# Patient Record
Sex: Female | Born: 1985
Health system: Southern US, Community
[De-identification: ages and names within clinical notes are randomized; demographics above are authoritative.]

## PROBLEM LIST (undated history)

## (undated) ENCOUNTER — Inpatient Hospital Stay (HOSPITAL_COMMUNITY): Payer: Self-pay

## (undated) DIAGNOSIS — Z8759 Personal history of other complications of pregnancy, childbirth and the puerperium: Secondary | ICD-10-CM

## (undated) DIAGNOSIS — D249 Benign neoplasm of unspecified breast: Secondary | ICD-10-CM

## (undated) DIAGNOSIS — Z8489 Family history of other specified conditions: Secondary | ICD-10-CM

## (undated) DIAGNOSIS — Z803 Family history of malignant neoplasm of breast: Secondary | ICD-10-CM

## (undated) DIAGNOSIS — J45909 Unspecified asthma, uncomplicated: Secondary | ICD-10-CM

## (undated) DIAGNOSIS — G43009 Migraine without aura, not intractable, without status migrainosus: Secondary | ICD-10-CM

## (undated) HISTORY — DX: Family history of malignant neoplasm of breast: Z80.3

## (undated) HISTORY — DX: Migraine without aura, not intractable, without status migrainosus: G43.009

## (undated) HISTORY — PX: SALPINGECTOMY: SHX328

## (undated) HISTORY — DX: Personal history of other complications of pregnancy, childbirth and the puerperium: Z87.59

## (undated) HISTORY — DX: Benign neoplasm of unspecified breast: D24.9

## (undated) SURGERY — Surgical Case
Anesthesia: *Unknown

---
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
## 2004-01-07 ENCOUNTER — Emergency Department (HOSPITAL_COMMUNITY): Admission: EM | Admit: 2004-01-07 | Discharge: 2004-01-08 | Payer: Self-pay

## 2004-05-06 ENCOUNTER — Emergency Department (HOSPITAL_COMMUNITY): Admission: EM | Admit: 2004-05-06 | Discharge: 2004-05-06 | Payer: Self-pay | Admitting: Emergency Medicine

## 2004-06-03 ENCOUNTER — Inpatient Hospital Stay (HOSPITAL_COMMUNITY): Admission: AD | Admit: 2004-06-03 | Discharge: 2004-06-03 | Payer: Self-pay | Admitting: Obstetrics

## 2004-06-29 ENCOUNTER — Other Ambulatory Visit: Admission: RE | Admit: 2004-06-29 | Discharge: 2004-06-29 | Payer: Self-pay | Admitting: Obstetrics and Gynecology

## 2004-08-04 ENCOUNTER — Emergency Department (HOSPITAL_COMMUNITY): Admission: EM | Admit: 2004-08-04 | Discharge: 2004-08-04 | Payer: Self-pay | Admitting: Family Medicine

## 2005-03-23 ENCOUNTER — Inpatient Hospital Stay (HOSPITAL_COMMUNITY): Admission: AD | Admit: 2005-03-23 | Discharge: 2005-03-23 | Payer: Self-pay | Admitting: *Deleted

## 2005-06-01 ENCOUNTER — Ambulatory Visit (HOSPITAL_COMMUNITY): Admission: RE | Admit: 2005-06-01 | Discharge: 2005-06-01 | Payer: Self-pay | Admitting: *Deleted

## 2005-06-24 ENCOUNTER — Ambulatory Visit: Payer: Self-pay | Admitting: Family Medicine

## 2005-06-24 ENCOUNTER — Inpatient Hospital Stay (HOSPITAL_COMMUNITY): Admission: AD | Admit: 2005-06-24 | Discharge: 2005-06-24 | Payer: Self-pay | Admitting: Obstetrics and Gynecology

## 2005-09-24 ENCOUNTER — Inpatient Hospital Stay (HOSPITAL_COMMUNITY): Admission: AD | Admit: 2005-09-24 | Discharge: 2005-09-24 | Payer: Self-pay | Admitting: *Deleted

## 2005-10-04 ENCOUNTER — Emergency Department (HOSPITAL_COMMUNITY): Admission: EM | Admit: 2005-10-04 | Discharge: 2005-10-04 | Payer: Self-pay | Admitting: *Deleted

## 2005-10-06 ENCOUNTER — Ambulatory Visit: Payer: Self-pay | Admitting: *Deleted

## 2005-10-06 ENCOUNTER — Inpatient Hospital Stay (HOSPITAL_COMMUNITY): Admission: AD | Admit: 2005-10-06 | Discharge: 2005-10-06 | Payer: Self-pay | Admitting: Obstetrics and Gynecology

## 2005-10-17 ENCOUNTER — Ambulatory Visit: Payer: Self-pay | Admitting: Family Medicine

## 2005-10-17 ENCOUNTER — Inpatient Hospital Stay (HOSPITAL_COMMUNITY): Admission: AD | Admit: 2005-10-17 | Discharge: 2005-10-20 | Payer: Self-pay | Admitting: *Deleted

## 2007-06-30 ENCOUNTER — Emergency Department (HOSPITAL_COMMUNITY): Admission: EM | Admit: 2007-06-30 | Discharge: 2007-06-30 | Payer: Self-pay | Admitting: Family Medicine

## 2007-08-22 ENCOUNTER — Emergency Department (HOSPITAL_COMMUNITY): Admission: EM | Admit: 2007-08-22 | Discharge: 2007-08-22 | Payer: Self-pay | Admitting: Emergency Medicine

## 2007-12-09 ENCOUNTER — Emergency Department (HOSPITAL_COMMUNITY): Admission: EM | Admit: 2007-12-09 | Discharge: 2007-12-09 | Payer: Self-pay | Admitting: Family Medicine

## 2008-07-17 ENCOUNTER — Emergency Department (HOSPITAL_COMMUNITY): Admission: EM | Admit: 2008-07-17 | Discharge: 2008-07-17 | Payer: Self-pay | Admitting: Family Medicine

## 2008-10-09 ENCOUNTER — Emergency Department (HOSPITAL_COMMUNITY): Admission: EM | Admit: 2008-10-09 | Discharge: 2008-10-09 | Payer: Self-pay | Admitting: Emergency Medicine

## 2008-12-11 ENCOUNTER — Emergency Department (HOSPITAL_COMMUNITY): Admission: EM | Admit: 2008-12-11 | Discharge: 2008-12-11 | Payer: Self-pay | Admitting: Family Medicine

## 2009-03-02 ENCOUNTER — Emergency Department (HOSPITAL_COMMUNITY): Admission: EM | Admit: 2009-03-02 | Discharge: 2009-03-02 | Payer: Self-pay | Admitting: Family Medicine

## 2011-01-19 LAB — POCT RAPID STREP A (OFFICE): Streptococcus, Group A Screen (Direct): NEGATIVE

## 2011-05-27 ENCOUNTER — Inpatient Hospital Stay (INDEPENDENT_AMBULATORY_CARE_PROVIDER_SITE_OTHER)
Admission: RE | Admit: 2011-05-27 | Discharge: 2011-05-27 | Disposition: A | Discharge status: Home / Self Care | Payer: Self-pay | Source: Ambulatory Visit | Attending: Emergency Medicine | Admitting: Emergency Medicine

## 2011-05-27 DIAGNOSIS — N926 Irregular menstruation, unspecified: Secondary | ICD-10-CM

## 2011-05-27 LAB — POCT I-STAT, CHEM 8
BUN: 14 mg/dL (ref 6–23)
Calcium, Ion: 1.18 mmol/L (ref 1.12–1.32)
HCT: 44 % (ref 36.0–46.0)
Hemoglobin: 15 g/dL (ref 12.0–15.0)
Sodium: 139 mEq/L (ref 135–145)
TCO2: 26 mmol/L (ref 0–100)

## 2011-05-27 LAB — POCT PREGNANCY, URINE: Preg Test, Ur: NEGATIVE

## 2011-07-05 ENCOUNTER — Inpatient Hospital Stay (INDEPENDENT_AMBULATORY_CARE_PROVIDER_SITE_OTHER)
Admission: RE | Admit: 2011-07-05 | Discharge: 2011-07-05 | Disposition: A | Discharge status: Home / Self Care | Payer: Self-pay | Source: Ambulatory Visit | Attending: Family Medicine | Admitting: Family Medicine

## 2011-07-05 DIAGNOSIS — J019 Acute sinusitis, unspecified: Secondary | ICD-10-CM

## 2011-07-08 ENCOUNTER — Ambulatory Visit (INDEPENDENT_AMBULATORY_CARE_PROVIDER_SITE_OTHER): Payer: Self-pay

## 2011-07-08 ENCOUNTER — Inpatient Hospital Stay (INDEPENDENT_AMBULATORY_CARE_PROVIDER_SITE_OTHER)
Admission: RE | Admit: 2011-07-08 | Discharge: 2011-07-08 | Disposition: A | Discharge status: Home / Self Care | Payer: Self-pay | Source: Ambulatory Visit | Attending: Family Medicine | Admitting: Family Medicine

## 2011-07-08 DIAGNOSIS — J189 Pneumonia, unspecified organism: Secondary | ICD-10-CM

## 2011-07-08 DIAGNOSIS — J019 Acute sinusitis, unspecified: Secondary | ICD-10-CM

## 2011-07-10 ENCOUNTER — Inpatient Hospital Stay (INDEPENDENT_AMBULATORY_CARE_PROVIDER_SITE_OTHER)
Admission: RE | Admit: 2011-07-10 | Discharge: 2011-07-10 | Disposition: A | Discharge status: Home / Self Care | Payer: Self-pay | Source: Ambulatory Visit | Attending: Family Medicine | Admitting: Family Medicine

## 2011-07-10 DIAGNOSIS — J189 Pneumonia, unspecified organism: Secondary | ICD-10-CM

## 2011-07-12 LAB — POCT RAPID STREP A: Streptococcus, Group A Screen (Direct): NEGATIVE

## 2011-07-20 LAB — POCT RAPID STREP A: Streptococcus, Group A Screen (Direct): POSITIVE — AB

## 2011-07-22 LAB — I-STAT 8, (EC8 V) (CONVERTED LAB)
BUN: 12
Bicarbonate: 27.8 — ABNORMAL HIGH
Chloride: 103
Glucose, Bld: 96
pCO2, Ven: 56.6 — ABNORMAL HIGH
pH, Ven: 7.299

## 2011-07-22 LAB — POCT URINALYSIS DIP (DEVICE)
Bilirubin Urine: NEGATIVE
Glucose, UA: NEGATIVE
Hgb urine dipstick: NEGATIVE
Ketones, ur: NEGATIVE
Nitrite: NEGATIVE
pH: 5

## 2011-07-22 LAB — POCT I-STAT CREATININE: Creatinine, Ser: 0.8

## 2015-07-31 ENCOUNTER — Encounter: Payer: Self-pay | Admitting: Obstetrics and Gynecology

## 2015-07-31 ENCOUNTER — Ambulatory Visit (INDEPENDENT_AMBULATORY_CARE_PROVIDER_SITE_OTHER): Payer: Managed Care, Other (non HMO) | Admitting: Obstetrics and Gynecology

## 2015-07-31 VITALS — BP 134/76 | HR 80 | Resp 16 | Ht 66.5 in | Wt 183.0 lb

## 2015-07-31 DIAGNOSIS — Z23 Encounter for immunization: Secondary | ICD-10-CM | POA: Diagnosis not present

## 2015-07-31 DIAGNOSIS — Z01419 Encounter for gynecological examination (general) (routine) without abnormal findings: Secondary | ICD-10-CM

## 2015-07-31 DIAGNOSIS — Z30432 Encounter for removal of intrauterine contraceptive device: Secondary | ICD-10-CM | POA: Diagnosis not present

## 2015-07-31 DIAGNOSIS — Z3169 Encounter for other general counseling and advice on procreation: Secondary | ICD-10-CM

## 2015-07-31 LAB — COMPREHENSIVE METABOLIC PANEL
ALK PHOS: 50 U/L (ref 33–115)
ALT: 7 U/L (ref 6–29)
AST: 14 U/L (ref 10–30)
Albumin: 4.2 g/dL (ref 3.6–5.1)
BILIRUBIN TOTAL: 0.5 mg/dL (ref 0.2–1.2)
BUN: 10 mg/dL (ref 7–25)
CO2: 25 mmol/L (ref 20–31)
CREATININE: 0.79 mg/dL (ref 0.50–1.10)
Calcium: 9.2 mg/dL (ref 8.6–10.2)
Chloride: 102 mmol/L (ref 98–110)
Glucose, Bld: 99 mg/dL (ref 65–99)
Potassium: 3.8 mmol/L (ref 3.5–5.3)
SODIUM: 138 mmol/L (ref 135–146)
TOTAL PROTEIN: 6.9 g/dL (ref 6.1–8.1)

## 2015-07-31 LAB — CBC
HCT: 41.5 % (ref 36.0–46.0)
Hemoglobin: 14.1 g/dL (ref 12.0–15.0)
MCH: 29.6 pg (ref 26.0–34.0)
MCHC: 34 g/dL (ref 30.0–36.0)
MCV: 87 fL (ref 78.0–100.0)
MPV: 11.1 fL (ref 8.6–12.4)
Platelets: 309 K/uL (ref 150–400)
RBC: 4.77 MIL/uL (ref 3.87–5.11)
RDW: 13 % (ref 11.5–15.5)
WBC: 8.2 K/uL (ref 4.0–10.5)

## 2015-07-31 LAB — LIPID PANEL
Cholesterol: 153 mg/dL (ref 125–200)
HDL: 37 mg/dL — ABNORMAL LOW (ref >=46)
LDL Cholesterol: 85 mg/dL (ref <130)
Total CHOL/HDL Ratio: 4.1 ratio (ref <=5.0)
Triglycerides: 153 mg/dL — ABNORMAL HIGH (ref <150)
VLDL: 31 mg/dL — ABNORMAL HIGH (ref <30)

## 2015-07-31 NOTE — Patient Instructions (Signed)

## 2015-07-31 NOTE — Progress Notes (Signed)
Patient ID: Kathy Aguilar, female   DOB: 1986-03-28, 29 y.o.   MRN: [id number] 29 y.o. [id number] Single Caucasian female here for annual exam.    Has had Mirena IUDs for the last 9 years.  Can skip cycles.  Would like IUD removed.  Interested in pregnancy.  Works for a Engineer, civil (consulting)security company.  [facility] [employer].  30 year old daughter.  Recently married.   PCP:   None  Patient's last menstrual period was 07/26/2015 (exact date).          Sexually active: Yes.   female The current method of family planning is IUD--Mirena inserted 11/2010.    Exercising: No.   Smoker:  no  Health Maintenance: Pap:  07/2014 normal per patient History of abnormal Pap:  no MMG:  n/a Colonoscopy:  n/a BMD:   na  Result  n/a TDaP:  10 years ago Screening Labs:  Hb today: 13.9, Urine today: Neg   reports that she has never smoked. She does not have any smokeless tobacco history on file. She reports that she drinks about 0.6 oz of alcohol per week. She reports that she does not use illicit drugs.  History reviewed. No pertinent past medical history.  History reviewed. No pertinent past surgical history.  No current outpatient prescriptions on file.   No current facility-administered medications for this visit.    Family History  Problem Relation Age of Onset  . Osteoarthritis Maternal Grandmother   . Hypertension Maternal Grandmother     ROS:  Pertinent items are noted in HPI.  Otherwise, a comprehensive ROS was negative.  Exam:   BP 134/76 mmHg  Pulse 80  Resp 16  Ht 5' 6.5" (1.689 m)  Wt 183 lb (83.008 kg)  BMI 29.10 kg/m2  LMP 07/26/2015 (Exact Date)    General appearance: alert, cooperative and appears stated age Head: Normocephalic, without obvious abnormality, atraumatic Neck: no adenopathy, supple, symmetrical, trachea midline and thyroid normal to inspection and palpation Lungs: clear to auscultation bilaterally Breasts: normal appearance, no masses or tenderness, Inspection negative, No  nipple retraction or dimpling, No nipple discharge or bleeding, No axillary or supraclavicular adenopathy Heart: regular rate and rhythm Abdomen: soft, non-tender; bowel sounds normal; no masses,  no organomegaly Extremities: extremities normal, atraumatic, no cyanosis or edema Skin: Skin color, texture, turgor normal. No rashes or lesions Lymph nodes: Cervical, supraclavicular, and axillary nodes normal. No abnormal inguinal nodes palpated Neurologic: Grossly normal  Pelvic: External genitalia:  no lesions              Urethra:  normal appearing urethra with no masses, tenderness or lesions              Bartholins and Skenes: normal                 Vagina: normal appearing vagina with normal color and discharge, no lesions              Cervix: no lesions and IUD strings seen.  Verbal consent for IUD removal, accomplished with ring forceps.  IUD intact and discarded.              Pap taken: No. Bimanual Exam:  Uterus:  normal size, contour, position, consistency, mobility, non-tender              Adnexa: normal adnexa and no mass, fullness, tenderness              Rectovaginal: No..     Chaperone was present  for exam.  Assessment:   Well woman visit with normal exam. Mirena IUD removed.  Desire for future fertility.   Plan: Yearly mammogram recommended after age 54.  Recommended self breast exam.  Pap and HR HPV as above. Recommended PNV. (Is currently taking.) Discussed regular exercise program including cardiovascular and weight bearing exercise. Labs performed.  Yes.  .   See orders.  Routine labs and Rubella titer. Refills given on medications.  No..    Discussed preconception care - avoid exposures, healthy diet and exercise and influenza vaccine.  Will receive TDap today. Discussed quick return to fertility.  Will need to avoid pregnancy for one month if needs Rubella vaccine.   Follow up annually and prn.     After visit summary provided.

## 2015-08-05 LAB — RUBELLA ANTIBODY, IGM: RUBELLA: 0.5 (ref <0.91)

## 2015-08-06 ENCOUNTER — Telehealth: Payer: Self-pay

## 2015-08-06 NOTE — Telephone Encounter (Signed)
Spoke with patient. Results and message given as seen below from Dr.Silva. Patient was not fasting when she had her labs done. Advised this may be cause for elevation but should still work on diet and exercise to reduce numbers. Patient is agreeable and verbalizes understanding.  Routing to provider for final review. Patient agreeable to disposition. Will close encounter.

## 2015-08-06 NOTE — Telephone Encounter (Signed)
Left message to call Kaitlyn at [phone]. 

## 2015-08-06 NOTE — Telephone Encounter (Signed)
-----   Message from Patton SallesBrook E Amundson C Silva, MD sent at 08/05/2015  7:04 PM EDT ----- Please report to patient her results of blood work showing mildly elevated triglycerides and low HDL (Good cholesterol).  This may not have been a fasting set of labs, but exercise and a diet low in saturated fat and cholesterol can reduce the numbers and risk of cardiovascular disease.  The CBC and metabolic profile were normal.   Her rubella titer is still pending.  It should be ready by the end of the week.  Just did not want her to keep waiting for the rest of these results.  Cc- Claudette LawsAmanda Dixon

## 2015-08-07 ENCOUNTER — Telehealth: Payer: Self-pay

## 2015-08-07 NOTE — Telephone Encounter (Signed)
-----  Message from Nunzio Cobbs, MD sent at 08/06/2015  4:34 PM EDT ----- Please let patient know of her rubella not immune status.  She will need to do an MMR through her PCP or the health department and prevent intercourse for one full month following her injection.   Cc- Marisa Sprinkles

## 2015-08-07 NOTE — Telephone Encounter (Signed)
Spoke with patient. Results given as seen below. Patient is agreeable. Will seek care at a local PCP close to home to have MMR. Aware she will need to prevent intercourse for one full month following her injection. Patient is agreeable.  Routing to provider for final review. Patient agreeable to disposition. Will close encounter.

## 2015-12-31 ENCOUNTER — Encounter: Payer: Self-pay | Admitting: Obstetrics and Gynecology

## 2016-01-14 ENCOUNTER — Emergency Department: Payer: Managed Care, Other (non HMO)

## 2016-01-14 ENCOUNTER — Encounter: Payer: Self-pay | Admitting: Emergency Medicine

## 2016-01-14 ENCOUNTER — Encounter
Admission: EM | Disposition: A | Discharge status: Home / Self Care | Payer: Self-pay | Source: Home / Self Care | Attending: Emergency Medicine

## 2016-01-14 ENCOUNTER — Observation Stay
Admission: EM | Admit: 2016-01-14 | Discharge: 2016-01-15 | Disposition: A | Discharge status: Home / Self Care | Payer: Managed Care, Other (non HMO) | Attending: Surgery | Admitting: Surgery

## 2016-01-14 DIAGNOSIS — Z8261 Family history of arthritis: Secondary | ICD-10-CM | POA: Diagnosis not present

## 2016-01-14 DIAGNOSIS — Z8249 Family history of ischemic heart disease and other diseases of the circulatory system: Secondary | ICD-10-CM | POA: Diagnosis not present

## 2016-01-14 DIAGNOSIS — R110 Nausea: Secondary | ICD-10-CM | POA: Diagnosis not present

## 2016-01-14 DIAGNOSIS — K3580 Unspecified acute appendicitis: Principal | ICD-10-CM | POA: Diagnosis present

## 2016-01-14 DIAGNOSIS — Z809 Family history of malignant neoplasm, unspecified: Secondary | ICD-10-CM | POA: Insufficient documentation

## 2016-01-14 DIAGNOSIS — R1031 Right lower quadrant pain: Secondary | ICD-10-CM | POA: Diagnosis not present

## 2016-01-14 DIAGNOSIS — K353 Acute appendicitis with localized peritonitis, without perforation or gangrene: Secondary | ICD-10-CM | POA: Insufficient documentation

## 2016-01-14 HISTORY — PX: LAPAROSCOPIC APPENDECTOMY: SHX408

## 2016-01-14 LAB — URINALYSIS COMPLETE WITH MICROSCOPIC (ARMC ONLY)
Bilirubin Urine: NEGATIVE
Glucose, UA: NEGATIVE mg/dL
HGB URINE DIPSTICK: NEGATIVE
Ketones, ur: NEGATIVE mg/dL
Leukocytes, UA: NEGATIVE
NITRITE: NEGATIVE
PROTEIN: NEGATIVE mg/dL
SPECIFIC GRAVITY, URINE: 1.004 — AB (ref 1.005–1.030)
pH: 7 (ref 5.0–8.0)

## 2016-01-14 LAB — COMPREHENSIVE METABOLIC PANEL
ALBUMIN: 4.5 g/dL (ref 3.5–5.0)
ALK PHOS: 52 U/L (ref 38–126)
ALT: 11 U/L — ABNORMAL LOW (ref 14–54)
ANION GAP: 5 (ref 5–15)
AST: 18 U/L (ref 15–41)
BUN: 12 mg/dL (ref 6–20)
CALCIUM: 8.7 mg/dL — AB (ref 8.9–10.3)
CO2: 25 mmol/L (ref 22–32)
Chloride: 102 mmol/L (ref 101–111)
Creatinine, Ser: 0.72 mg/dL (ref 0.44–1.00)
GFR calc non Af Amer: >60 mL/min (ref >60)
GLUCOSE: 95 mg/dL (ref 65–99)
POTASSIUM: 3.5 mmol/L (ref 3.5–5.1)
SODIUM: 132 mmol/L — AB (ref 135–145)
Total Bilirubin: 0.4 mg/dL (ref 0.3–1.2)
Total Protein: 7.7 g/dL (ref 6.5–8.1)

## 2016-01-14 LAB — CBC
HEMATOCRIT: 40.1 % (ref 35.0–47.0)
HEMOGLOBIN: 13.7 g/dL (ref 12.0–16.0)
MCH: 29.2 pg (ref 26.0–34.0)
MCHC: 34.1 g/dL (ref 32.0–36.0)
MCV: 85.4 fL (ref 80.0–100.0)
Platelets: 226 10*3/uL (ref 150–440)
RBC: 4.7 MIL/uL (ref 3.80–5.20)
RDW: 13 % (ref 11.5–14.5)
WBC: 15.9 10*3/uL — ABNORMAL HIGH (ref 3.6–11.0)

## 2016-01-14 LAB — LIPASE, BLOOD: LIPASE: 17 U/L (ref 11–51)

## 2016-01-14 LAB — POCT PREGNANCY, URINE: PREG TEST UR: NEGATIVE

## 2016-01-14 SURGERY — APPENDECTOMY, LAPAROSCOPIC
Anesthesia: General | Site: Abdomen | Wound class: Clean Contaminated

## 2016-01-14 MED ORDER — IOPAMIDOL (ISOVUE-300) INJECTION 61%
100.0000 mL | Freq: Once | INTRAVENOUS | Status: AC | PRN
  Administered 2016-01-14: 100 mL via INTRAVENOUS

## 2016-01-14 MED ORDER — MORPHINE SULFATE (PF) 4 MG/ML IV SOLN
4.0000 mg | Freq: Once | INTRAVENOUS | Status: AC
  Administered 2016-01-14: 4 mg via INTRAVENOUS
  Filled 2016-01-14: qty 1

## 2016-01-14 MED ORDER — DIATRIZOATE MEGLUMINE & SODIUM 66-10 % PO SOLN
15.0000 mL | Freq: Once | ORAL | Status: AC
  Administered 2016-01-14: 15 mL via ORAL

## 2016-01-14 MED ORDER — SODIUM CHLORIDE 0.9 % IV BOLUS (SEPSIS)
1000.0000 mL | Freq: Once | INTRAVENOUS | Status: AC
  Administered 2016-01-14: 1000 mL via INTRAVENOUS

## 2016-01-14 MED ORDER — ONDANSETRON HCL 4 MG/2ML IJ SOLN
4.0000 mg | Freq: Once | INTRAMUSCULAR | Status: AC
  Administered 2016-01-14: 4 mg via INTRAVENOUS
  Filled 2016-01-14: qty 2

## 2016-01-14 SURGICAL SUPPLY — 47 items
ADH LQ OCL WTPRF AMP STRL LF (MISCELLANEOUS) ×1
ADHESIVE MASTISOL STRL (MISCELLANEOUS) ×3 IMPLANT
APPLIER CLIP ROT 10 11.4 M/L (STAPLE) ×3
APR CLP MED LRG 11.4X10 (STAPLE) ×1
BLADE SURG SZ11 CARB STEEL (BLADE) ×3 IMPLANT
CANISTER SUCT 3000ML (MISCELLANEOUS) ×3 IMPLANT
CATH TRAY 16F METER LATEX (MISCELLANEOUS) ×3 IMPLANT
CHLORAPREP W/TINT 26ML (MISCELLANEOUS) ×3 IMPLANT
CLIP APPLIE ROT 10 11.4 M/L (STAPLE) ×1 IMPLANT
CLOSURE WOUND 1/2 X4 (GAUZE/BANDAGES/DRESSINGS) ×1
CUTTER FLEX LINEAR 45M (STAPLE) ×3 IMPLANT
DEVICE TROCAR PUNCTURE CLOSURE (ENDOMECHANICALS) ×3 IMPLANT
ELECT REM PT RETURN 9FT ADLT (ELECTROSURGICAL) ×3
ELECTRODE REM PT RTRN 9FT ADLT (ELECTROSURGICAL) ×1 IMPLANT
ENDOPOUCH RETRIEVER 10 (MISCELLANEOUS) ×3 IMPLANT
GAUZE SPONGE NON-WVN 2X2 STRL (MISCELLANEOUS) ×3 IMPLANT
GLOVE BIO SURGEON STRL SZ8 (GLOVE) ×3 IMPLANT
GOWN STRL REUS W/ TWL LRG LVL3 (GOWN DISPOSABLE) ×2 IMPLANT
GOWN STRL REUS W/TWL LRG LVL3 (GOWN DISPOSABLE) ×6
IRRIGATION STRYKERFLOW (MISCELLANEOUS) ×1 IMPLANT
IRRIGATOR STRYKERFLOW (MISCELLANEOUS) ×3
KIT RM TURNOVER STRD PROC AR (KITS) ×3 IMPLANT
LABEL OR SOLS (LABEL) ×3 IMPLANT
NDL SAFETY 22GX1.5 (NEEDLE) ×3 IMPLANT
NEEDLE VERESS 14GA 120MM (NEEDLE) ×3 IMPLANT
NS IRRIG 500ML POUR BTL (IV SOLUTION) ×3 IMPLANT
PACK LAP CHOLECYSTECTOMY (MISCELLANEOUS) ×3 IMPLANT
RELOAD 45 VASCULAR/THIN (ENDOMECHANICALS) ×3 IMPLANT
RELOAD STAPLE 45 2.5 WHT GRN (ENDOMECHANICALS) ×1 IMPLANT
RELOAD STAPLE 45 3.5 BLU ETS (ENDOMECHANICALS) ×1 IMPLANT
RELOAD STAPLE TA45 3.5 REG BLU (ENDOMECHANICALS) ×3 IMPLANT
SCISSORS METZENBAUM CVD 33 (INSTRUMENTS) IMPLANT
SLEEVE ENDOPATH XCEL 5M (ENDOMECHANICALS) ×3 IMPLANT
SOL .9 NS 3000ML IRR  AL (IV SOLUTION) ×2
SOL .9 NS 3000ML IRR AL (IV SOLUTION) ×1
SOL .9 NS 3000ML IRR UROMATIC (IV SOLUTION) ×1 IMPLANT
SPONGE LAP 18X18 5 PK (GAUZE/BANDAGES/DRESSINGS) ×3 IMPLANT
SPONGE VERSALON 2X2 STRL (MISCELLANEOUS) ×9
STRIP CLOSURE SKIN 1/2X4 (GAUZE/BANDAGES/DRESSINGS) ×2 IMPLANT
SUT MNCRL 4-0 (SUTURE) ×3
SUT MNCRL 4-0 27XMFL (SUTURE) ×1
SUT VICRYL 0 TIES 12 18 (SUTURE) ×3 IMPLANT
SUTURE MNCRL 4-0 27XMF (SUTURE) ×1 IMPLANT
TRAP SPECIMEN MUCOUS 40CC (MISCELLANEOUS) ×1 IMPLANT
TROCAR XCEL 12X100 BLDLESS (ENDOMECHANICALS) ×3 IMPLANT
TROCAR XCEL NON-BLD 5MMX100MML (ENDOMECHANICALS) ×3 IMPLANT
TUBING INSUFFLATOR HI FLOW (MISCELLANEOUS) ×3 IMPLANT

## 2016-01-14 NOTE — H&P (Signed)
Kathy Aguilar is an 30 y.o. female.    Chief Complaint: Right lower Quadrant pain  HPI: This patient with the acute onset of right lower quadrant pain this morning that woke her up her proximally 6 AM she's never had an episode like this before she's had nausea but no emesis and 8 at about noon today. She denies fevers or chills is never had an episode like this before and points the right lower quadrant and right flank as the area of maximal pain. She's had a normal bowel movement today.  No significant family history. 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  History reviewed. No pertinent past medical history.  History reviewed. No pertinent past surgical history.  Family History  Problem Relation Age of Onset  . Osteoarthritis Maternal Grandmother   . Hypertension Maternal Grandmother   . Cancer Maternal Grandmother     blood cancer?   Social History:  reports that she has never smoked. She does not have any smokeless tobacco history on file. She reports that she drinks about 0.6 oz of alcohol per week. She reports that she does not use illicit drugs.  Allergies: No Known Allergies   (Not in a hospital admission)   Review of Systems  Constitutional: Negative.   HENT: Negative.   Eyes: Negative.   Respiratory: Negative.   Cardiovascular: Negative.   Gastrointestinal: Positive for nausea and abdominal pain. Negative for heartburn, vomiting, diarrhea, constipation, blood in stool and melena.  Genitourinary: Negative.   Musculoskeletal: Negative.   Skin: Negative.   Neurological: Negative.   Endo/Heme/Allergies: Negative.   Psychiatric/Behavioral: Negative.      Physical Exam:  BP 125/84 mmHg  Pulse 95  Temp(Src) 98.6 F (37 C) (Oral)  Resp 20  Ht '5\' 6"'  (1.676 m)  Wt 183 lb (83.008 kg)  BMI 29.55 kg/m2  SpO2 100%  LMP 12/16/2015 (Exact Date)  Physical Exam  Constitutional: She is oriented to person, place, and time and well-developed,  well-nourished, and in no distress. No distress.  Appears quite comfortable  HENT:  Head: Normocephalic and atraumatic.  Eyes: Pupils are equal, round, and reactive to light. Right eye exhibits no discharge. Left eye exhibits no discharge. No scleral icterus.  Neck: Normal range of motion. Neck supple.  Cardiovascular: Normal rate, regular rhythm and normal heart sounds.   Pulmonary/Chest: Effort normal and breath sounds normal. No respiratory distress. She has no wheezes. She has no rales.  Abdominal: Soft. She exhibits no distension. There is tenderness. There is guarding. There is no rebound.  Axial tenderness right lower quadrant McBurney's point with a questionable Rovsing sign  Musculoskeletal: Normal range of motion. She exhibits no edema.  Lymphadenopathy:    She has no cervical adenopathy.  Neurological: She is alert and oriented to person, place, and time.  Skin: Skin is warm and dry. She is not diaphoretic.  Psychiatric: Mood and affect normal.  Vitals reviewed.       Results for orders placed or performed during the hospital encounter of 01/14/16 (from the past 48 hour(s))  Lipase, blood     Status: None   Collection Time: 01/14/16  3:51 PM  Result Value Ref Range   Lipase 17 11 - 51 U/L  Comprehensive metabolic panel     Status: Abnormal   Collection Time: 01/14/16  3:51 PM  Result Value Ref Range   Sodium 132 (L) 135 - 145 mmol/L   Potassium 3.5 3.5 - 5.1 mmol/L   Chloride  102 101 - 111 mmol/L   CO2 25 22 - 32 mmol/L   Glucose, Bld 95 65 - 99 mg/dL   BUN 12 6 - 20 mg/dL   Creatinine, Ser 0.72 0.44 - 1.00 mg/dL   Calcium 8.7 (L) 8.9 - 10.3 mg/dL   Total Protein 7.7 6.5 - 8.1 g/dL   Albumin 4.5 3.5 - 5.0 g/dL   AST 18 15 - 41 U/L   ALT 11 (L) 14 - 54 U/L   Alkaline Phosphatase 52 38 - 126 U/L   Total Bilirubin 0.4 0.3 - 1.2 mg/dL   GFR calc non Af Amer >60 >60 mL/min   GFR calc Af Amer >60 >60 mL/min    Comment: (NOTE) The eGFR has been calculated using the  CKD EPI equation. This calculation has not been validated in all clinical situations. eGFR's persistently <60 mL/min signify possible Chronic Kidney Disease.    Anion gap 5 5 - 15  CBC     Status: Abnormal   Collection Time: 01/14/16  3:51 PM  Result Value Ref Range   WBC 15.9 (H) 3.6 - 11.0 K/uL   RBC 4.70 3.80 - 5.20 MIL/uL   Hemoglobin 13.7 12.0 - 16.0 g/dL   HCT 40.1 35.0 - 47.0 %   MCV 85.4 80.0 - 100.0 fL   MCH 29.2 26.0 - 34.0 pg   MCHC 34.1 32.0 - 36.0 g/dL   RDW 13.0 11.5 - 14.5 %   Platelets 226 150 - 440 K/uL  Urinalysis complete, with microscopic ([facility] only)     Status: Abnormal   Collection Time: 01/14/16  6:08 PM  Result Value Ref Range   Color, Urine COLORLESS (A) YELLOW   APPearance CLEAR (A) CLEAR   Glucose, UA NEGATIVE NEGATIVE mg/dL   Bilirubin Urine NEGATIVE NEGATIVE   Ketones, ur NEGATIVE NEGATIVE mg/dL   Specific Gravity, Urine 1.004 (L) 1.005 - 1.030   Hgb urine dipstick NEGATIVE NEGATIVE   pH 7.0 5.0 - 8.0   Protein, ur NEGATIVE NEGATIVE mg/dL   Nitrite NEGATIVE NEGATIVE   Leukocytes, UA NEGATIVE NEGATIVE   RBC / HPF 0-5 0 - 5 RBC/hpf   WBC, UA 0-5 0 - 5 WBC/hpf   Bacteria, UA RARE (A) NONE SEEN   Squamous Epithelial / LPF 0-5 (A) NONE SEEN  Pregnancy, urine POC     Status: None   Collection Time: 01/14/16  6:35 PM  Result Value Ref Range   Preg Test, Ur NEGATIVE NEGATIVE    Comment:        THE SENSITIVITY OF THIS METHODOLOGY IS >24 mIU/mL    Ct Abdomen Pelvis W Contrast  01/14/2016  CLINICAL DATA:  Right lower quadrant pain for 1 day EXAM: CT ABDOMEN AND PELVIS WITH CONTRAST TECHNIQUE: Multidetector CT imaging of the abdomen and pelvis was performed using the standard protocol following bolus administration of intravenous contrast. CONTRAST:  140m ISOVUE-300 IOPAMIDOL (ISOVUE-300) INJECTION 61% COMPARISON:  None. FINDINGS: Lung bases are free of acute infiltrate or sizable effusion. The liver, gallbladder, spleen, adrenal glands and pancreas are  all normal in their CT appearance. The kidneys are well visualized bilaterally. No renal calculi are noted. The ureters are well visualized to the urinary bladder without obstructive change. The bladder is well distended. The appendix is mildly inflamed consistent with acute appendicitis. No changes of perforation or focal abscess are seen. The uterus and ovaries are within normal limits. No free pelvic fluid is noted. The osseous structures are grossly unremarkable. IMPRESSION: Changes consistent  with acute appendicitis. No perforation is identified. Electronically Signed   By: Inez Catalina M.D.   On: 01/14/2016 20:08     Assessment/Plan  Acute appendicitis CT scan is personally reviewed recommend laparoscopy the rationale for this been discussed the options of observation reviewed the risks of bleeding infection conversion to an open procedure recurrence negative laparoscopy reviewed. She understood and agreed to proceed. Chest about travel [place] on Friday and while there would be no restrictions provided this is good results without complication the patient would not likely feel well enough to travel long distances by car this is discussed with her she understood and will decide on travel as her recovery proceeds.  Florene Glen, MD, FACS

## 2016-01-14 NOTE — ED Notes (Signed)
Patient presents with right LQ abdominal pain. Pain woke her this AM about 6 and was more umbilical at that time. Has shifted to right during the day. Patient denies nausea/vomiting. Ate breakfast and lunch. Last intake of food was 1230 and water until 1800.

## 2016-01-14 NOTE — ED Provider Notes (Signed)
[facility] Emergency Department Provider Note  Time seen: 6:44 PM  I have reviewed the triage vital signs and the nursing notes.   HISTORY  Chief Complaint Abdominal Pain    HPI Kathy Aguilar is a 30 y.o. female with no past medical history who presents the emergency department right lower quadrant abdominal pain. According to the patient she was awoken out of her sleep at 6:00 this morning with right lower quadrant abdominal pain. Moderate in severity. Nausea this morning but denies any vomiting, denies any nausea currently. Denies any diarrhea, dysuria, vaginal discharge or bleeding. Patient denies fever. Patient went to [facility] but was referred to the emergent department for further evaluation. Patient describes her abdominal pain is moderate located in the mid to right lower quadrant. Dull aching quality.     History reviewed. No pertinent past medical history.  There are no active problems to display for this patient.   History reviewed. No pertinent past surgical history.  No current outpatient prescriptions on file.  Allergies Review of patient's allergies indicates no known allergies.  Family History  Problem Relation Age of Onset  . Osteoarthritis Maternal Grandmother   . Hypertension Maternal Grandmother   . Cancer Maternal Grandmother     blood cancer?    Social History Social History  Substance Use Topics  . Smoking status: Never Smoker   . Smokeless tobacco: None  . Alcohol Use: 0.6 oz/week    1 Standard drinks or equivalent per week     Comment: 3 drinks per month    Review of Systems Constitutional: Negative for fever. Cardiovascular: Negative for chest pain. Respiratory: Negative for shortness of breath. Gastrointestinal: Right lower quadrant abdominal pain. Positive for nausea. Negative for vomiting or diarrhea. Genitourinary: Negative for dysuria. Negative for vaginal bleeding or discharge. Musculoskeletal: Negative  for back pain. Neurological: Negative for headache 10-point ROS otherwise negative.  ____________________________________________   PHYSICAL EXAM:  VITAL SIGNS: ED Triage Vitals  Enc Vitals Group     BP 01/14/16 1548 131/80 mmHg     Pulse Rate 01/14/16 1548 118     Resp 01/14/16 1548 20     Temp 01/14/16 1548 98.6 F (37 C)     Temp Source 01/14/16 1548 Oral     SpO2 01/14/16 1548 99 %     Weight 01/14/16 1548 183 lb (83.008 kg)     Height 01/14/16 1548 5\' 6"  (1.676 m)     Head Cir --      Peak Flow --      Pain Score 01/14/16 1558 4     Pain Loc --      Pain Edu? --      Excl. in GC? --     Constitutional: Alert and oriented. Well appearing and in no distress. Eyes: Normal exam ENT   Head: Normocephalic and atraumatic.   Mouth/Throat: Mucous membranes are moist. Cardiovascular: Normal rate, regular rhythm. No murmur Respiratory: Normal respiratory effort without tachypnea nor retractions. Breath sounds are clear Gastrointestinal: Soft, moderate right lower quadrant tenderness palpation. No rebound or guarding. No distention. Musculoskeletal: Nontender with normal range of motion in all extremities. Neurologic:  Normal speech and language. No gross focal neurologic deficits Skin:  Skin is warm, dry and intact.  Psychiatric: Mood and affect are normal. Speech and behavior are normal.  ____________________________________________   RADIOLOGY  CT consistent with acute appendicitis  ____________________________________________   INITIAL IMPRESSION / ASSESSMENT AND PLAN / ED COURSE  Pertinent labs &  imaging results that were available during my care of the patient were reviewed by me and considered in my medical decision making (see chart for details).  Patient presents with right lower quadrant pain which began approximately 12 hours ago. Moderate tenderness to palpation currently. Describes her pain as a 5/10. We'll dose pain medication, nausea medication,  pursued a CT abdomen/pelvis to rule out appendicitis.  CT consistent with acute appendicitis. No perforation. I discussed the patient with Dr. Excell Seltzer who will be down to see the patient.  ____________________________________________   FINAL CLINICAL IMPRESSION(S) / ED DIAGNOSES  Right lower quadrant abdominal pain Acute appendicitis  Minna Antis, MD 01/14/16 2026

## 2016-01-14 NOTE — ED Notes (Signed)
Patient presents to the ED with right lower quadrant pain that woke patient up out of her sleep at 6am this morning.   Patient states pain is constant but there are occasional extremely sharp pains that come occasionally.  Patient denies vomiting or diarrhea.  Patient denies fever.  Patient sitting calmly during triage without obvious distress.  [facility]  Patient had a POCT urine pregnancy test done there as well as a urinalysis both of which were negative.

## 2016-01-15 ENCOUNTER — Emergency Department: Payer: Managed Care, Other (non HMO) | Admitting: Anesthesiology

## 2016-01-15 ENCOUNTER — Encounter: Payer: Self-pay | Admitting: Surgery

## 2016-01-15 DIAGNOSIS — K353 Acute appendicitis with localized peritonitis: Secondary | ICD-10-CM | POA: Diagnosis not present

## 2016-01-15 DIAGNOSIS — K3580 Unspecified acute appendicitis: Secondary | ICD-10-CM | POA: Diagnosis present

## 2016-01-15 MED ORDER — FENTANYL CITRATE (PF) 100 MCG/2ML IJ SOLN
INTRAMUSCULAR | Status: DC | PRN
  Administered 2016-01-15 (×2): 50 ug via INTRAVENOUS
  Administered 2016-01-15: 100 ug via INTRAVENOUS

## 2016-01-15 MED ORDER — HEPARIN SODIUM (PORCINE) 5000 UNIT/ML IJ SOLN: 5000.0000 [IU] | Freq: Three times a day (TID) | INTRAMUSCULAR | Status: DC

## 2016-01-15 MED ORDER — BUPIVACAINE-EPINEPHRINE (PF) 0.25% -1:200000 IJ SOLN
INTRAMUSCULAR | Status: DC | PRN
  Administered 2016-01-15: 30 mL via PERINEURAL

## 2016-01-15 MED ORDER — DEXTROSE 5 % IV SOLN: 2.0000 g | Freq: Once | INTRAVENOUS | Status: DC

## 2016-01-15 MED ORDER — HEPARIN SODIUM (PORCINE) 5000 UNIT/ML IJ SOLN
5000.0000 [IU] | Freq: Three times a day (TID) | INTRAMUSCULAR | Status: DC
Start: 2016-01-15 — End: 2016-01-15
  Administered 2016-01-15: 5000 [IU] via SUBCUTANEOUS

## 2016-01-15 MED ORDER — OXYCODONE HCL 5 MG PO TABS: 5.0000 mg | ORAL_TABLET | ORAL | Status: DC | PRN

## 2016-01-15 MED ORDER — MIDAZOLAM HCL 2 MG/2ML IJ SOLN
INTRAMUSCULAR | Status: DC | PRN
  Administered 2016-01-15: 2 mg via INTRAVENOUS

## 2016-01-15 MED ORDER — PROPOFOL 10 MG/ML IV BOLUS
INTRAVENOUS | Status: DC | PRN
  Administered 2016-01-15: 150 mg via INTRAVENOUS

## 2016-01-15 MED ORDER — LACTATED RINGERS IV SOLN: INTRAVENOUS | Status: DC

## 2016-01-15 MED ORDER — LACTATED RINGERS IV SOLN
INTRAVENOUS | Status: DC
  Administered 2016-01-15 (×3): via INTRAVENOUS

## 2016-01-15 MED ORDER — DEXTROSE 5 % IV SOLN
2.0000 g | Freq: Once | INTRAVENOUS | Status: AC
  Administered 2016-01-15: 2 g via INTRAVENOUS
  Filled 2016-01-15: qty 2

## 2016-01-15 MED ORDER — FENTANYL CITRATE (PF) 100 MCG/2ML IJ SOLN
25.0000 ug | INTRAMUSCULAR | Status: DC | PRN
  Administered 2016-01-15 (×4): 25 ug via INTRAVENOUS

## 2016-01-15 MED ORDER — HEPARIN SODIUM (PORCINE) 5000 UNIT/ML IJ SOLN
5000.0000 [IU] | Freq: Three times a day (TID) | INTRAMUSCULAR | Status: DC
  Filled 2016-01-15: qty 1

## 2016-01-15 MED ORDER — MORPHINE SULFATE (PF) 2 MG/ML IV SOLN: 2.0000 mg | INTRAVENOUS | Status: DC | PRN

## 2016-01-15 MED ORDER — BUPIVACAINE-EPINEPHRINE (PF) 0.25% -1:200000 IJ SOLN
INTRAMUSCULAR | Status: AC
  Filled 2016-01-15: qty 30

## 2016-01-15 MED ORDER — FENTANYL CITRATE (PF) 100 MCG/2ML IJ SOLN
INTRAMUSCULAR | Status: AC
  Filled 2016-01-15: qty 2

## 2016-01-15 MED ORDER — ONDANSETRON HCL 4 MG/2ML IJ SOLN
4.0000 mg | Freq: Once | INTRAMUSCULAR | Status: AC | PRN
Start: 2016-01-15 — End: 2016-01-15
  Administered 2016-01-15: 4 mg via INTRAVENOUS

## 2016-01-15 MED ORDER — ROCURONIUM BROMIDE 100 MG/10ML IV SOLN
INTRAVENOUS | Status: DC | PRN
  Administered 2016-01-15: 5 mg via INTRAVENOUS

## 2016-01-15 MED ORDER — OXYCODONE HCL 5 MG PO TABS
5.0000 mg | ORAL_TABLET | ORAL | Status: DC | PRN
  Administered 2016-01-15 (×2): 5 mg via ORAL
  Filled 2016-01-15 (×2): qty 1

## 2016-01-15 MED ORDER — LIDOCAINE HCL (CARDIAC) 20 MG/ML IV SOLN
INTRAVENOUS | Status: DC | PRN
  Administered 2016-01-15: 30 mg via INTRAVENOUS

## 2016-01-15 MED ORDER — HEPARIN SODIUM (PORCINE) 5000 UNIT/ML IJ SOLN
INTRAMUSCULAR | Status: AC
  Filled 2016-01-15: qty 1

## 2016-01-15 MED ORDER — SODIUM CHLORIDE 0.9 % IR SOLN
Status: DC | PRN
  Administered 2016-01-15: 1000 mL

## 2016-01-15 MED ORDER — DEXAMETHASONE SODIUM PHOSPHATE 10 MG/ML IJ SOLN
INTRAMUSCULAR | Status: DC | PRN
  Administered 2016-01-15: 4 mg via INTRAVENOUS

## 2016-01-15 MED ORDER — SUCCINYLCHOLINE CHLORIDE 20 MG/ML IJ SOLN
INTRAMUSCULAR | Status: DC | PRN
  Administered 2016-01-15: 100 mg via INTRAVENOUS

## 2016-01-15 NOTE — ED Notes (Addendum)
Pt voices good understanding of procedure to be performed as she st explained by Dr Excell Seltzerooper; voices understanding of consent to be signed & consent signed; pt up to bathroom to void and to undress completely into hospital gown; pt with no jewelry on; SO at bedside

## 2016-01-15 NOTE — Discharge Instructions (Signed)
Remove dressing in 24 hours. °May shower in 24 hours. °Leave paper strips in place. °Resume all home medications. °Follow-up with Dr. Briggitte Boline in 10 days. °

## 2016-01-15 NOTE — Anesthesia Procedure Notes (Signed)
Procedure Name: Intubation Date/Time: 01/15/2016 5:48 AM Performed by: Waldo LaineJUSTIS, Kathy Earnhart Pre-anesthesia Checklist: Patient identified, Emergency Drugs available, Suction available, Patient being monitored and Timeout performed Patient Re-evaluated:Patient Re-evaluated prior to inductionOxygen Delivery Method: Circle system utilized Preoxygenation: Pre-oxygenation with 100% oxygen Intubation Type: IV induction Ventilation: Mask ventilation without difficulty Laryngoscope Size: Miller and 2 Grade View: Grade I Tube type: Oral Number of attempts: 1 Airway Equipment and Method: Stylet Placement Confirmation: ETT inserted through vocal cords under direct vision,  positive ETCO2 and breath sounds checked- equal and bilateral Secured at: 20 cm Tube secured with: Tape Dental Injury: Teeth and Oropharynx as per pre-operative assessment

## 2016-01-15 NOTE — Discharge Summary (Signed)
  Patient ID: Kathy Aguilar MRN: [id number] DOB/AGE: [birth year] 30 y.o.  Admit date: 01/14/2016 Discharge date: 01/15/2016   Discharge Diagnoses:  Active Problems:   Acute appendicitis   Procedures: Lap appendectomy  Hospital Course: 30 year old female presented with abdominal pain and workup revealed evidence of acute appendicitis. She was taken to the operating room for a laparoscopic appendectomy by Dr. Excell Seltzerooper and she did very well postoperatively. The time of discharge she was ambulating, tolerating a regular diet, her abdomen was soft and incisions were clean dry and intact with no evidence of infection. Her vital signs were stable. Condition of the patient and the time of discharge is stable   Disposition:   Discharge Instructions    Call MD for:  difficulty breathing, headache or visual disturbances    Complete by:  As directed      Call MD for:  extreme fatigue    Complete by:  As directed      Call MD for:  hives    Complete by:  As directed      Call MD for:  persistant dizziness or light-headedness    Complete by:  As directed      Call MD for:  persistant nausea and vomiting    Complete by:  As directed      Call MD for:  redness, tenderness, or signs of infection (pain, swelling, redness, odor or green/yellow discharge around incision site)    Complete by:  As directed      Call MD for:  severe uncontrolled pain    Complete by:  As directed      Call MD for:  temperature >100.4    Complete by:  As directed      Diet - low sodium heart healthy    Complete by:  As directed      Discharge instructions    Complete by:  As directed   May shower Saturday am     Increase activity slowly    Complete by:  As directed      Lifting restrictions    Complete by:  As directed   20 lbs     Remove dressing in 24 hours    Complete by:  As directed             Medication List    TAKE these medications        acetaminophen 500 MG tablet  Commonly known as:  TYLENOL   Take 1,000 mg by mouth every 6 (six) hours as needed for mild pain or headache.     oxyCODONE 5 MG immediate release tablet  Commonly known as:  Oxy IR/ROXICODONE  Take 1 tablet (5 mg total) by mouth every 4 (four) hours as needed for moderate pain.           Follow-up Information    Follow up with Dionne Miloichard Cooper, MD In 10 days.   Specialty:  Surgery   Contact information:   [address] [place] [id number] [phone]        Sterling Bigiego Izel Hochberg, MD FACS

## 2016-01-15 NOTE — Transfer of Care (Signed)
Immediate Anesthesia Transfer of Care Note  Patient: Kathy BasemanHeather M Lipa  Procedure(s) Performed: Procedure(s): APPENDECTOMY LAPAROSCOPIC (N/A)  Patient Location: PACU  Anesthesia Type:General  Level of Consciousness: awake, alert , oriented and patient cooperative  Airway & Oxygen Therapy: Patient Spontanous Breathing  Post-op Assessment: Report given to RN and Post -op Vital signs reviewed and stable  Post vital signs: Reviewed and stable  Last Vitals:  Filed Vitals:   01/15/16 0100 01/15/16 0337  BP: 112/79 111/76  Pulse: 88 105  Temp:  36.6 C  Resp:  18    Complications: No apparent anesthesia complications

## 2016-01-15 NOTE — Anesthesia Postprocedure Evaluation (Signed)
Anesthesia Post Note  Patient: Geroge BasemanHeather M Helbig  Procedure(s) Performed: Procedure(s) (LRB): APPENDECTOMY LAPAROSCOPIC (N/A)  Patient location during evaluation: PACU Anesthesia Type: General Level of consciousness: awake and alert Pain management: pain level controlled Vital Signs Assessment: post-procedure vital signs reviewed and stable Respiratory status: spontaneous breathing and respiratory function stable Cardiovascular status: blood pressure returned to baseline and stable Anesthetic complications: no    Last Vitals:  Filed Vitals:   01/15/16 0624 01/15/16 0646  BP: 120/84 116/78  Pulse: 98 105  Temp: 36.7 C 36.5 C  Resp: 16 20    Last Pain:  Filed Vitals:   01/15/16 0646  PainSc: 3                  KEPHART,WILLIAM K

## 2016-01-15 NOTE — Anesthesia Preprocedure Evaluation (Signed)
Anesthesia Evaluation  Patient identified by MRN, date of birth, ID band Patient awake    Reviewed: Allergy & Precautions, NPO status , Patient's Chart, lab work & pertinent test results  History of Anesthesia Complications Negative for: history of anesthetic complications  Airway Mallampati: II       Dental   Pulmonary neg pulmonary ROS,           Cardiovascular negative cardio ROS       Neuro/Psych negative neurological ROS     GI/Hepatic negative GI ROS, Neg liver ROS,   Endo/Other  negative endocrine ROS  Renal/GU negative Renal ROS     Musculoskeletal   Abdominal   Peds  Hematology negative hematology ROS (+)   Anesthesia Other Findings   Reproductive/Obstetrics                             Anesthesia Physical Anesthesia Plan  ASA: I and emergent  Anesthesia Plan: General   Post-op Pain Management:    Induction: Intravenous  Airway Management Planned: Oral ETT  Additional Equipment:   Intra-op Plan:   Post-operative Plan:   Informed Consent: I have reviewed the patients History and Physical, chart, labs and discussed the procedure including the risks, benefits and alternatives for the proposed anesthesia with the patient or authorized representative who has indicated his/her understanding and acceptance.     Plan Discussed with:   Anesthesia Plan Comments:         Anesthesia Quick Evaluation  

## 2016-01-15 NOTE — ED Notes (Signed)
Report given to Brandy, OR ?

## 2016-01-15 NOTE — Op Note (Signed)
laparascopic appendectomy   [name] Date of operation:  01/15/2016  Indications: The patient presented with a history of  abdominal pain. Workup has revealed findings consistent with acute appendicitis.  Pre-operative Diagnosis: Acute appendicitis  Post-operative Diagnosis: Acute appendicitis, nonruptured  Surgeon: Adah Salvageichard E. Excell Seltzerooper, MD, FACS  Anesthesia: General with endotracheal tube  Procedure Details  The patient was seen again in the preop area. The options of surgery versus observation were reviewed with the patient and/or family. The risks of bleeding, infection, recurrence of symptoms, negative laparoscopy, potential for an open procedure, bowel injury, abscess or infection, were all reviewed as well. The patient was taken to Operating Room, identified as Kathy HeraldHeather M Aguilar and the procedure verified as laparoscopic appendectomy. A Time Out was held and the above information confirmed.  The patient was placed in the supine position and general anesthesia was induced.  Antibiotic prophylaxis was administered and VT E prophylaxis was in place. A Foley catheter was placed by the nursing staff.   The abdomen was prepped and draped in a sterile fashion. An infraumbilical incision was made. A Veress needle was placed and pneumoperitoneum was obtained. A 5 mm trocar port was placed without difficulty and the abdominal cavity was explored.  Under direct vision a 5 mm suprapubic port was placed and a 13 mm left lateral port was placed all under direct vision avoiding her multiple tattoos.  The appendix was identified and found to be acutely inflamed but not ruptured . The appendix was carefully dissected. The base of the appendix was dissected out and divided with a standard load Endo GIA. The mesoappendix was divided with a vascular load Endo GIA. Clips were utilized to reinforce the staple line where there was minimal arterial bleeding along the staple line. This controlled the  bleeding. The appendix was passed out through the left lateral port site with the aid of an Endo Catch bag. The right lower quadrant and pelvis was then irrigated with copious amounts of normal saline which was aspirated. Inspection  failed to identify any additional bleeding and there were no signs of bowel injury. Therefore the left lateral port site was closed under direct vision utilizing an Endo Close technique with 0 Vicryl interrupted sutures, all under direct vision.   Again the right lower quadrant was inspected there was no sign of bleeding or bowel injury therefore pneumoperitoneum was released, all ports were removed and the skin incisions were approximated with subcuticular 4-0 Monocryl. Steri-Strips and Mastisol and sterile dressings were placed.  The patient tolerated the procedure well, there were no complications. The sponge lap and needle count were correct at the end of the procedure.  The patient was taken to the recovery room in stable condition to be admitted for continued care.  Findings: Acute appendicitis nonruptured  Estimated Blood Loss: 20 cc                  Specimens: appendix         Complications:  None                  Richard E. Excell Seltzerooper MD, FACS

## 2016-01-16 LAB — SURGICAL PATHOLOGY

## 2016-01-21 ENCOUNTER — Telehealth: Payer: Self-pay | Admitting: Surgery

## 2016-01-21 NOTE — Telephone Encounter (Signed)
Returned phone call to patient at this time. What she describes sounds like an adhesive reaction from the steri-strips as it is only under strips at this time. Denies redness, drainage, fever, nausea, and vomiting. Reassured patient that as soon as steri-strips come off, this will go away but discouraged from popping blister as this will introduce infection. She verbalizes understanding. Confirmed patient's post-op appointment next week and encouraged her to call back with any further questions or concerns.

## 2016-01-21 NOTE — Telephone Encounter (Signed)
Patient has called with questions concerning a blister that has came up underneath the bandages from the incision site. Patient had a Laparoscopic Appendectomy done with Dr Excell Seltzerooper on 01/14/16. Please contact patient at [phone].

## 2016-01-27 ENCOUNTER — Encounter: Payer: Self-pay | Admitting: General Surgery

## 2016-01-27 ENCOUNTER — Ambulatory Visit (INDEPENDENT_AMBULATORY_CARE_PROVIDER_SITE_OTHER): Payer: Managed Care, Other (non HMO) | Admitting: General Surgery

## 2016-01-27 VITALS — BP 119/82 | HR 88 | Temp 98.2°F | Ht 66.0 in | Wt 179.0 lb

## 2016-01-27 DIAGNOSIS — Z4889 Encounter for other specified surgical aftercare: Secondary | ICD-10-CM

## 2016-01-27 NOTE — Progress Notes (Signed)
Outpatient Surgical Follow Up  01/27/2016  Kathy Aguilar is an 30 y.o. female.   Chief Complaint  Patient presents with  . Routine Post Op    Laparoscopic Appendectomy Dr. Excell Seltzerooper 01/15/2016    HPI: 30 year old female returns to clinic for follow-up status post laparoscopic appendectomy. Patient reports some pain to the left lower quadrant incision site going on for left hip. She denies any fevers, chills, nausea, vomiting, diarrhea, constipation. She's been eating well and has been able to return to work or any. She denies any pain or discomfort from her other incision sites and is not currently taking any pain medications.  Past Medical History  Diagnosis Date  . Appendicitis     Past Surgical History  Procedure Laterality Date  . Laparoscopic appendectomy N/A 01/14/2016    Procedure: APPENDECTOMY LAPAROSCOPIC;  Surgeon: Lattie Hawichard E Cooper, MD;  Location: [facility] ORS;  Service: General;  Laterality: N/A;    Family History  Problem Relation Age of Onset  . Osteoarthritis Maternal Grandmother   . Hypertension Maternal Grandmother   . Cancer Maternal Grandmother     blood cancer?    Social History:  reports that she has never smoked. She does not have any smokeless tobacco history on file. She reports that she drinks about 0.6 oz of alcohol per week. She reports that she does not use illicit drugs.  Allergies: No Known Allergies  Medications reviewed.    ROS A multipoint review of systems was completed. All pertinent positives and negatives are documented within the history of present illness and remainder are negative.   BP 119/82 mmHg  Pulse 88  Temp(Src) 98.2 F (36.8 C) (Oral)  Ht 5\' 6"  (1.676 m)  Wt 81.194 kg (179 lb)  BMI 28.91 kg/m2  LMP 01/17/2016  Physical Exam Gen.: No acute distress Chest: Clear to auscultation Heart: Regular rate and rhythm Abdomen: Soft, nondistended. Well approximated laparoscopic appendectomy incisions without evidence of erythema or  drainage. The left lower quadrant incision is mildly tender to deep palpation. No evidence of hernia or infection.    No results found for this or any previous visit (from the past 48 hour(s)). No results found.  Assessment/Plan:  1. Aftercare following surgery 30 year old female status post laparoscopic appendectomy. Doing well. Discussed signs and symptoms of infection or herniation and to report to clinic medially should they occur. Provided with standard postoperative incision care instructions. Otherwise she'll follow-up in clinic on an as-needed basis.     Ricarda Frameharles Mahdi Frye, MD [facility] General Surgeon  01/27/2016,9:57 AM

## 2016-01-27 NOTE — Patient Instructions (Signed)
Please call our office with any questions or concerns.  Please do not submerge in a tub, hot tub, or pool until incisions are completely sealed.  Use sun block to incision area over the next year if this area will be exposed to sun. This helps decrease scarring.  You may now resume your normal activities. Listen to your body when lifting, if you have pain when lifting, stop and then try again in a few days.  If you develop redness, drainage, or pain at incision sites- call our office immediately and speak with a nurse.  Please take Ibuprofen or Tylenol to help with your Left Hip pain.

## 2016-05-17 ENCOUNTER — Encounter: Payer: Self-pay | Admitting: Obstetrics and Gynecology

## 2016-05-17 ENCOUNTER — Ambulatory Visit (INDEPENDENT_AMBULATORY_CARE_PROVIDER_SITE_OTHER): Payer: Managed Care, Other (non HMO) | Admitting: Obstetrics and Gynecology

## 2016-05-17 VITALS — BP 120/76 | HR 80 | Ht 66.5 in | Wt 183.2 lb

## 2016-05-17 DIAGNOSIS — R109 Unspecified abdominal pain: Secondary | ICD-10-CM | POA: Diagnosis not present

## 2016-05-17 DIAGNOSIS — Z3169 Encounter for other general counseling and advice on procreation: Secondary | ICD-10-CM | POA: Diagnosis not present

## 2016-05-17 LAB — POCT URINALYSIS DIPSTICK
BILIRUBIN UA: NEGATIVE
Glucose, UA: NEGATIVE
KETONES UA: NEGATIVE
Leukocytes, UA: NEGATIVE
NITRITE UA: NEGATIVE
Protein, UA: NEGATIVE
RBC UA: NEGATIVE
UROBILINOGEN UA: NEGATIVE
pH, UA: 5

## 2016-05-17 NOTE — Progress Notes (Signed)
GYNECOLOGY  VISIT   HPI: 30 y.o.   Married  Caucasian  female   G1P1001 with Patient's last menstrual period was 05/11/2016 (exact date).   here for consult. Patient had Mirena IUD removed 07/2015 and hasn't gotten pregnant.  Patient's mother present for the entire visit today.   Menses occurring every 29 days and last 3 - 4 days. No cramping.  No PMS but feels breast tenderness.   No ovulation predictor kits.   Not using any products to increase moisture.   Patient's child is 63 years old and was conceived with different father.  Her partner has not fathered any children.   Hx laparoscopic appy 01/15/16  Nonruptured appendix.  Denies hx of pelvic infection.   She complains of right flank pain and mild pelvic discomfort today and thinks she may have a urinary tract infection.   Not sure if she pulled a muscle. Denies dysuria. No hx of renal stones.   FH of early menopause at age 46-43 - mother, maternal aunts x 2, and maternal grandmother.   Urine Dip: Negative    GYNECOLOGIC HISTORY: Patient's last menstrual period was 05/11/2016 (exact date). Contraception:  None Menopausal hormone therapy:  n/a Last mammogram:  n/a Last pap smear:   07/2014 normal        OB History    Gravida Para Term Preterm AB Living   '1 1 1     2   ' SAB TAB Ectopic Multiple Live Births           1         There are no active problems to display for this patient.   Past Medical History:  Diagnosis Date  . Appendicitis     Past Surgical History:  Procedure Laterality Date  . LAPAROSCOPIC APPENDECTOMY N/A 01/14/2016   Procedure: APPENDECTOMY LAPAROSCOPIC;  Surgeon: Florene Glen, MD;  Location: [facility] ORS;  Service: General;  Laterality: N/A;    No current outpatient prescriptions on file.   No current facility-administered medications for this visit.      ALLERGIES: Review of patient's allergies indicates no known allergies.  Family History  Problem Relation Age of Onset  .  Breast cancer Mother 71  . Osteoarthritis Maternal Grandmother   . Hypertension Maternal Grandmother   . Cancer Maternal Grandmother     blood cancer?  . Cancer Maternal Grandfather 72    prostate cancer    Social History   Social History  . Marital status: Married    Spouse name: N/A  . Number of children: N/A  . Years of education: N/A   Occupational History  . Not on file.   Social History Main Topics  . Smoking status: Never Smoker  . Smokeless tobacco: Never Used  . Alcohol use 0.6 oz/week    1 Standard drinks or equivalent per week     Comment: 3 drinks per month  . Drug use: No  . Sexual activity: Yes    Partners: Male    Birth control/ protection: None   Other Topics Concern  . Not on file   Social History Narrative  . No narrative on file    ROS:  Pertinent items are noted in HPI.  PHYSICAL EXAMINATION:    BP 120/76 (BP Location: Right Arm, Patient Position: Sitting, Cuff Size: Normal)   Pulse 80   Ht 5' 6.5" (1.689 m)   Wt 183 lb 3.2 oz (83.1 kg)   LMP 05/11/2016 (Exact Date)   BMI 29.13  kg/m     General appearance: alert, cooperative and appears stated age   Abdomen: soft, non-tender, no masses,  no organomegaly Back:  Negative CVA tenderness.   Pelvic: External genitalia:  no lesions              Urethra:  normal appearing urethra with no masses, tenderness or lesions              Bartholins and Skenes: normal                 Vagina: normal appearing vagina with normal color and discharge, no lesions              Cervix: no lesions                Bimanual Exam:  Uterus:  normal size, contour, position, consistency, mobility, non-tender              Adnexa: no mass, fullness, tenderness         Chaperone was present for exam.  ASSESSMENT  Desire for fertility.  FH of early menopause.  Recent laparoscopic appy.  Hx prior pregnancy.  New partner.  Right flank pain. No evidence of UTI or stone.   PLAN  Discussed definition of  infertility and proper evaluation after one year of trying.  Do ovulation predictor kit and report back results.  Instructed how to do.  If no ovulation, will do progesterone on day number 23.  Will do SA and HSG after one year of trying for pregnancy if unsuccessful.  Start PNV.  Avoid exposures - tobacco, ETOH, unnecessary medications, uncooked foods. Discussed literature to read - What to Expect When Trying for Pregnancy.  Follow up prn.    An After Visit Summary was printed and given to the patient.

## 2016-07-07 ENCOUNTER — Telehealth: Payer: Self-pay | Admitting: Obstetrics and Gynecology

## 2016-07-07 NOTE — Telephone Encounter (Signed)
Spoke with patient. Patient was seen in the office on 05/17/2016 with Dr.Silva to discuss preconception and infertility concerns. The patient's mother reccommended that she see Dr.Miller (mother is a patient at our practice). The patient requests an appointment for a second opinion and to discuss options with Dr.Miller. Appointment scheduled for 07/16/2016 at 3 pm with Dr.Miller. Patient is agreeable to appointment date and time.  Routing to provider for final review. Patient agreeable to disposition. Will close encounter.

## 2016-07-07 NOTE — Telephone Encounter (Signed)
Patient called and requested an appointment with Dr. Hyacinth MeekerMiller to consult about fertility concerns. She just saw Dr. Edward JollySilva for the same thing but then her mom recommended Dr. Hyacinth MeekerMiller to her. Routing to triage for review and scheduling.

## 2016-07-16 ENCOUNTER — Ambulatory Visit (INDEPENDENT_AMBULATORY_CARE_PROVIDER_SITE_OTHER): Payer: Managed Care, Other (non HMO) | Admitting: Obstetrics & Gynecology

## 2016-07-16 ENCOUNTER — Encounter: Payer: Self-pay | Admitting: Obstetrics & Gynecology

## 2016-07-16 VITALS — BP 122/78 | HR 100 | Resp 14 | Ht 66.5 in | Wt 188.4 lb

## 2016-07-16 DIAGNOSIS — Z0184 Encounter for antibody response examination: Secondary | ICD-10-CM | POA: Diagnosis not present

## 2016-07-16 DIAGNOSIS — N978 Female infertility of other origin: Secondary | ICD-10-CM | POA: Diagnosis not present

## 2016-07-16 DIAGNOSIS — Z9049 Acquired absence of other specified parts of digestive tract: Secondary | ICD-10-CM

## 2016-07-16 DIAGNOSIS — N979 Female infertility, unspecified: Secondary | ICD-10-CM

## 2016-07-16 NOTE — Progress Notes (Signed)
42 yrs G48P1 Caucasian Married female here to discuss desires to have another pregnancy.  Married for four years.  Had Mirena removed 10/16.  Cycles are fairly regular, 29-30 days.  Flow lasts 3 days.  Has done OPK and these have been positive.  Pt is ovulating around day 16 - 17.  She had child in previous relationship who is now almost 30 years old.  Husband has never fathered children.  He is adopted and knows no family hx.  She is taking a PNV.      LMP:  07/11/16.  PMed hx:  Neg  PSurg Hx: Appendectomy Smoker: No  Spouse PMed Hx: neg Spouse PSurg hx:  Knee arthroscopy age 30 Spouse smokes: No, dips tobacco  D/w pt components of fertility evaluation including testing for ovulation, semen analysis testing, and evaluation of cavity of uterus and for tubal patency.  Speed of this evaluation and proceeding with fertility medication will depend on desires and pt and her spouse.   She is not sure if they would proceed with IVF but are interested in testing for fertility.  One of her concerns is in regards to appendectomy done earlier this year.  She did not have a ruptured appendix.  May need to consider laparoscopy after tubal patency assessment is completed.  Lastly, due to age, feel should test for ovarian reserve as well.  All of this was written down for pt and she clearly understands the plan.  Questions invited and answered.  Specific instructions regarding each part of evaluation were given to patient.  Assessment:  Secondary infertility Possible luteal phase defect Prior appendectomy earlier this year  Plan:   1.  Day 3 FSH testing.  Pt will call with onset of cycle.  Clearly understands this can only be done on day 1.  2.  Will plan SHGM with this cycle.  3.  Semen analysis kit and order given.  4.  Pt understands she may need to use Clomid or Femara as ovulation is around day 16-17.  Risks of twins and triplets discussed.  5. Rubella testing today.  Pt had IgM testing but need IgG  testing.  Aware if this is non-immune, vaccination will be recommended.  ~25 minutes spent with patient >50% of time was in face to face discussion of above.

## 2016-07-19 LAB — RUBELLA SCREEN: RUBELLA: 1.14 {index} — AB (ref <0.90)

## 2016-07-20 ENCOUNTER — Telehealth: Payer: Self-pay | Admitting: Obstetrics & Gynecology

## 2016-07-20 ENCOUNTER — Ambulatory Visit (INDEPENDENT_AMBULATORY_CARE_PROVIDER_SITE_OTHER): Payer: Managed Care, Other (non HMO)

## 2016-07-20 ENCOUNTER — Ambulatory Visit (INDEPENDENT_AMBULATORY_CARE_PROVIDER_SITE_OTHER): Payer: Managed Care, Other (non HMO) | Admitting: Obstetrics & Gynecology

## 2016-07-20 ENCOUNTER — Encounter: Payer: Self-pay | Admitting: Obstetrics & Gynecology

## 2016-07-20 ENCOUNTER — Other Ambulatory Visit: Payer: Self-pay | Admitting: Obstetrics & Gynecology

## 2016-07-20 ENCOUNTER — Other Ambulatory Visit: Payer: Self-pay

## 2016-07-20 VITALS — BP 118/70 | HR 72 | Resp 16 | Wt 186.0 lb

## 2016-07-20 DIAGNOSIS — N979 Female infertility, unspecified: Secondary | ICD-10-CM

## 2016-07-20 DIAGNOSIS — Z9049 Acquired absence of other specified parts of digestive tract: Secondary | ICD-10-CM | POA: Diagnosis not present

## 2016-07-20 DIAGNOSIS — N971 Female infertility of tubal origin: Secondary | ICD-10-CM

## 2016-07-20 MED ORDER — DOXYCYCLINE HYCLATE 100 MG PO CAPS: 100.0000 mg | ORAL_CAPSULE | Freq: Two times a day (BID) | ORAL | 0 refills | Status: DC

## 2016-07-20 NOTE — Progress Notes (Deleted)
GYNECOLOGY  VISIT   HPI: 30 y.o. 471P1001 Married {Race/ethnicity:17218} female with Patient's last menstrual period was 07/11/2016.   here for ***.  GYNECOLOGIC HISTORY: Patient's last menstrual period was 07/11/2016. Contraception: *** Menopausal hormone therapy: ***  There are no active problems to display for this patient.   Past Medical History:  Diagnosis Date  . Appendicitis     Past Surgical History:  Procedure Laterality Date  . LAPAROSCOPIC APPENDECTOMY N/A 01/14/2016   Procedure: APPENDECTOMY LAPAROSCOPIC;  Surgeon: Lattie Hawichard E Cooper, MD;  Location: [facility] ORS;  Service: General;  Laterality: N/A;    MEDS:  Reviewed in EPIC and UTD  ALLERGIES: Review of patient's allergies indicates no known allergies.  Family History  Problem Relation Age of Onset  . Breast cancer Mother 7548  . Osteoarthritis Maternal Grandmother   . Hypertension Maternal Grandmother   . Cancer Maternal Grandmother     blood cancer?  . Cancer Maternal Grandfather 2870    prostate cancer    SH:  ***  ROS  PHYSICAL EXAMINATION:    BP 118/70 (BP Location: Right Arm, Patient Position: Sitting, Cuff Size: Normal)   Pulse 72   Resp 16   Wt 186 lb (84.4 kg)   LMP 07/11/2016   BMI 29.57 kg/m     General appearance: alert, cooperative and appears stated age Neck: no adenopathy, supple, symmetrical, trachea midline and thyroid {CHL AMB PHY EX THYROID NORM [phone]::"normal to inspection and palpation"} CV:  {Exam; heart brief:31539} Lungs:  {pe lungs ob:314451::"clear to auscultation, no wheezes, rales or rhonchi, symmetric air entry"} Breasts: {Exam; breast:13139::"normal appearance, no masses or tenderness"} Abdomen: soft, non-tender; bowel sounds normal; no masses,  no organomegaly  Pelvic: External genitalia:  no lesions              Urethra:  normal appearing urethra with no masses, tenderness or lesions              Bartholins and Skenes: normal                 Vagina: normal  appearing vagina with normal color and discharge, no lesions              Cervix: {CHL AMB PHY EX CERVIX NORM DEFAULT:[phone]::"no lesions"}              Bimanual Exam:  Uterus:  {CHL AMB PHY EX UTERUS NORM DEFAULT:[phone]::"normal size, contour, position, consistency, mobility, non-tender"}              Adnexa: {CHL AMB PHY EX ADNEXA NO MASS DEFAULT:[phone]::"no mass, fullness, tenderness"}              Rectovaginal: {yes no:314532}.  Confirms.              Anus:  normal sphincter tone, no lesions  Chaperone was present for exam.  Assessment: ***  Plan: ***   ~{NUMBERS; -10-45 JOINT ROM:10287} minutes spent with patient >50% of time was in face to face discussion of above.

## 2016-07-20 NOTE — Progress Notes (Signed)
30 y.o.Marriedfemale here for a pelvic ultrasound with sonohystogram due to desires for additional pregnancy.  Had Mirena removed 10/16 and has not conceived.  Cycles are about every 29-30 days.  Husband is adopted and has no history about birth family.  Here for ultrasound so assess tubal patency, cavity assessment, and assess ovaries.    Reviewed rubella testing.  No vaccine needed.  Patient's last menstrual period was 07/11/2016.  Contraception: none  Technique:  Both transabdominal and transvaginal ultrasound examinations of the pelvis were performed. Transabdominal technique was performed for global imaging of the pelvis including uterus, ovaries, adnexal regions, and pelvic cul-de-sac.  It was necessary to proceed with endovaginal exam following the abdominal ultrasound transabdominal exam to visualize the endometrium and adnexa.  Color and duplex Doppler ultrasound was utilized to evaluate blood flow to the ovaries.   FINDINGS: Uterus: 7.7 x 4.3 x 3.4cm.  No fibroids seen. Endometrium: 4.734mm Adnexa:  Left: 3.5 x 2.3 x 1.9cm with 1.7cm follicle     Right: 3.6 x 2.1 x 1.8cm Cul de sac: no free lfuid  SHSG:  After obtaining appropriate verbal consent from patient, the cervix was visualized using a speculum, and prepped with betadine.  Catheter with sponge tip placed at cervical os.  Saline instilled.  Normal cavity noted.  Spill from left side note.  No spill from right side noted.  Pt tolerated procedure well.  All instruments removed.    Discussion:  With prior appendectomy this year and no spill from right side, there may be adhesions present.  Using ovulation induction may increase risk of ectopic pregnancy.  If hydrosalpinx were present, clearly this would increase risk.  D/W pt proceeding with laparoscopy for evaluation of this tube and for possible excision of right tube.  Chromopertubation would be done at the same time for assessment if no adhesions were present.    Procedure  discussed with patient.  Hospital stay, recovery and pain management all discussed.  Risks discussed including but not limited to bleeding, <1% risk of receiving a  transfusion, infection, <1% risk of bowel/bladder/ureteral/vascular injury discussed as well as possible need for additional surgery if injury does occur discussed.  DVT/PE and rare risk of death discussed.  My actual complications with prior surgeries discussed.  Possible tubal excision discussed.  Pt is comfortable with all of the above and desires to proceed.    Also, spouse is going to proceed with semen analysis.  Insurance won't cover Dr. Lyndal RainbowYalcinkaya's office.  Made call to [facility] today to get information about semen analysis.  Information provided to pt.  Orders will be faxed and then signed for pt.  She will be made aware when this is completed.   Assessment:  Secondary infertility, possible luteal phase defect, possible tubal occlusion on right.   Plan:   Diagnostic laparoscopy with possible right salpingectomy will be planned.  Pt aware she will be contacted next week about this. Pt is going to proceed with day 3 FSH testing and knows to call with onset of cycle Semen analysis will also be planned.  They have number to call for scheduling.    After pt left, decided to treat with doxycycline 100mg  bid x 3 days.  Rx to pharmacy.  DPR reviewed and message left for pt.

## 2016-07-20 NOTE — Telephone Encounter (Signed)
Raynelle FanningJulie with [facility] calling for order for semen analysis for patient's husband Kathy BuffKyle Evon. His dob is 01/05/1991. Requisition form came over by fax and given to triage nurse.

## 2016-07-20 NOTE — Telephone Encounter (Signed)
Order form completed and to Dr.Miller for review and signature before faxing to [facility].

## 2016-07-20 NOTE — Telephone Encounter (Signed)
Form signed and returned.  Ok to close encounter.

## 2016-07-21 ENCOUNTER — Telehealth: Payer: Self-pay | Admitting: Obstetrics & Gynecology

## 2016-07-21 NOTE — Telephone Encounter (Signed)
Referral form faxed with cover sheet to [facility] at [phone].  Will close encounter.

## 2016-07-21 NOTE — Telephone Encounter (Signed)
Referral refaxed with cover sheet and confirmation to [facility] at [phone].  Routing to provider for final review. Patient agreeable to disposition. Will close encounter.

## 2016-07-21 NOTE — Telephone Encounter (Signed)
Kathy ClarksJulie Aguilar is calling from [facility]  to have the semen analysis order re faxed. Please fax it to [phone]. This order was never received.

## 2016-07-22 ENCOUNTER — Encounter: Payer: Self-pay | Admitting: Obstetrics & Gynecology

## 2016-07-27 ENCOUNTER — Telehealth: Payer: Self-pay | Admitting: *Deleted

## 2016-07-27 ENCOUNTER — Telehealth: Payer: Self-pay | Admitting: Obstetrics & Gynecology

## 2016-07-27 NOTE — Telephone Encounter (Signed)
[facility] with Kathy [facility] at [facility]. To fax results of semen analysis to [phone].

## 2016-07-27 NOTE — Telephone Encounter (Signed)
Spoke with patient. Called to review semen results per Dr. Rondel BatonMiller's request, patient verbalized understanding. Patient to proceed with diagnostic laparoscopy. Patient states has spoke with Thomasene LotSuzy about benefits. Advised [facility] will [facility]. Lab results to scan.    Routing to provider for final review. Patient is agreeable to disposition. Will close encounter.

## 2016-07-27 NOTE — Telephone Encounter (Signed)
Spoke with patient in reference to a recommended procedure. During that conversation, patient asked if lab results were available from tests last week.  I advised patient I will forward her question to out clinical staff for review. Patient is agreeable to a return call.  Routing to Clinical Triage

## 2016-07-27 NOTE — Telephone Encounter (Signed)
Call to patient to discuss surgery date options. Discussed at least first week out of work, potential for up to 2 weeks out of work depending on extent of procedure. Patient will consider this and decide on date and call back with decision.

## 2016-07-27 NOTE — Telephone Encounter (Signed)
Spoke with patient. Patient requesting results from husbands semen analysis dated 07/22/16. Advised patient I would need to review and return call. Patient agreeable.

## 2016-07-28 NOTE — Telephone Encounter (Signed)
Return call to patient. Ar office visit on 07-20-16 patient discussed surgery fully with Dr Hyacinth MeekerMiller and denies any questions now. Offered office appointment to discuss surgery and patient declines need for this. Surgery consult cancelled.   Routing to provider for final review. Patient agreeable to disposition. Will close encounter.

## 2016-07-28 NOTE — Telephone Encounter (Signed)
Patient returning your call.

## 2016-07-28 NOTE — Telephone Encounter (Signed)
Patient says she's returning Sally's call. °

## 2016-07-28 NOTE — Telephone Encounter (Signed)
Return call from patient. Would like to proceed with surgery date of 08-17-16. Surgery instructions reviewed and printed copy will be mailed, see copy scanned to chart.   Routing to provider for final review. Patient agreeable to disposition. Will close encounter.

## 2016-07-30 ENCOUNTER — Other Ambulatory Visit: Payer: Self-pay | Admitting: Obstetrics & Gynecology

## 2016-07-30 NOTE — Progress Notes (Signed)
Pre-op orders placed

## 2016-08-04 NOTE — Patient Instructions (Addendum)
Your procedure is scheduled on:  Tuesday, Nov. 7, 2017  [facility] at: 6:00 AM  Pick up the phone at the desk and dial [phone].  Call this number if you have problems the morning of surgery: [phone].  Remember: Do NOT eat food or  drink after:  Midnight Monday, Nov. 6, 2017  Take these medicines the morning of surgery with a SIP OF WATER:  None  Stop ALL herbal medications at this time   Do NOT wear jewelry (body piercing), metal hair clips/bobby pins, make-up, or nail polish. Do NOT wear lotions, powders, or perfumes.  You may wear deodorant. Do NOT shave for 48 hours prior to surgery. Do NOT bring valuables to the hospital. Contacts, dentures, or bridgework may not be worn into surgery.  Have a responsible adult drive you home and stay with you for 24 hours after your procedure

## 2016-08-05 ENCOUNTER — Ambulatory Visit: Payer: Managed Care, Other (non HMO) | Admitting: Obstetrics & Gynecology

## 2016-08-05 ENCOUNTER — Encounter (HOSPITAL_COMMUNITY)
Admission: RE | Admit: 2016-08-05 | Discharge: 2016-08-05 | Disposition: A | Discharge status: Home / Self Care | Payer: Managed Care, Other (non HMO) | Source: Ambulatory Visit | Attending: Obstetrics & Gynecology | Admitting: Obstetrics & Gynecology

## 2016-08-05 ENCOUNTER — Encounter (HOSPITAL_COMMUNITY): Payer: Self-pay

## 2016-08-05 DIAGNOSIS — Z01812 Encounter for preprocedural laboratory examination: Secondary | ICD-10-CM | POA: Insufficient documentation

## 2016-08-05 LAB — CBC
HEMATOCRIT: 40.2 % (ref 36.0–46.0)
HEMOGLOBIN: 13.9 g/dL (ref 12.0–15.0)
MCH: 29.8 pg (ref 26.0–34.0)
MCHC: 34.6 g/dL (ref 30.0–36.0)
MCV: 86.3 fL (ref 78.0–100.0)
Platelets: 263 10*3/uL (ref 150–400)
RBC: 4.66 MIL/uL (ref 3.87–5.11)
RDW: 12.8 % (ref 11.5–15.5)
WBC: 11 10*3/uL — ABNORMAL HIGH (ref 4.0–10.5)

## 2016-08-10 ENCOUNTER — Telehealth: Payer: Self-pay | Admitting: Obstetrics & Gynecology

## 2016-08-10 DIAGNOSIS — N979 Female infertility, unspecified: Secondary | ICD-10-CM

## 2016-08-10 NOTE — Telephone Encounter (Signed)
Can you please confirm with her when she started her period.  This will determine what she will need.

## 2016-08-10 NOTE — Telephone Encounter (Signed)
Spoke with patient. Patient sates LMP started 08/09/16.

## 2016-08-10 NOTE — Telephone Encounter (Signed)
Patient states Dr. Hyacinth MeekerMiller told her to schedule labs for hormone testing. Will she need an order/ She is scheduled for tomorrow @ 3:30.

## 2016-08-10 NOTE — Telephone Encounter (Signed)
Returned call to patient. Patient states she was calling to schedule lab appt and that was already scheduled for 08/11/16 at 3:30pm for "hormone testing". Patient verbalizes understanding and is agreeable to date and time.   Dr. Hyacinth MeekerMiller, what labs would you like ordered?

## 2016-08-10 NOTE — Telephone Encounter (Signed)
Left message to call Kolbie Clarkston at [phone].  

## 2016-08-11 ENCOUNTER — Other Ambulatory Visit: Payer: Managed Care, Other (non HMO)

## 2016-08-11 DIAGNOSIS — N978 Female infertility of other origin: Secondary | ICD-10-CM

## 2016-08-11 DIAGNOSIS — N979 Female infertility, unspecified: Secondary | ICD-10-CM

## 2016-08-11 NOTE — Telephone Encounter (Signed)
Dr. Edward JollySilva, lab has notified me that patient has changed her lab appt from 3:30pm today to 11:00. No labs have been ordered for this patient. Please advise?   Cc: Dr. Hyacinth MeekerMiller

## 2016-08-11 NOTE — Telephone Encounter (Signed)
RN spoke with patient 08/10/16 to confirm LMP started 08/09/16. [facility]    Cc: Dr. Hyacinth MeekerMiller  Dr. Edward JollySilva, [facility] to close encounter?

## 2016-08-11 NOTE — Telephone Encounter (Signed)
FYI

## 2016-08-11 NOTE — Telephone Encounter (Signed)
Encounter closed.   Cc- Dr. Hyacinth MeekerMiller

## 2016-08-11 NOTE — Telephone Encounter (Signed)
If she is on day 3 of her cycle, [facility]  There is nothing else documented in the chart in terms of testing.  Please confirm the day of her cycle and then put in the order if this lines up correctly.  I will be happy to sign the order.

## 2016-08-12 LAB — FOLLICLE STIMULATING HORMONE: FSH: 8.2 m[IU]/mL

## 2016-08-12 NOTE — Telephone Encounter (Signed)
Yes, thank you.

## 2016-08-16 NOTE — Anesthesia Preprocedure Evaluation (Addendum)
Anesthesia Evaluation  Patient identified by MRN, date of birth, ID band Patient awake    Reviewed: Allergy & Precautions, NPO status , Patient's Chart, lab work & pertinent test results  Airway Mallampati: II  TM Distance: >3 FB Neck ROM: Full    Dental  (+) Dental Advisory Given   Pulmonary neg pulmonary ROS,    breath sounds clear to auscultation       Cardiovascular negative cardio ROS   Rhythm:Regular Rate:Normal     Neuro/Psych negative neurological ROS     GI/Hepatic negative GI ROS, Neg liver ROS,   Endo/Other  negative endocrine ROS  Renal/GU negative Renal ROS     Musculoskeletal   Abdominal   Peds  Hematology negative hematology ROS (+)   Anesthesia Other Findings   Reproductive/Obstetrics                            Lab Results  Component Value Date   WBC 11.0 (H) 08/05/2016   HGB 13.9 08/05/2016   HCT 40.2 08/05/2016   MCV 86.3 08/05/2016   PLT 263 08/05/2016   Lab Results  Component Value Date   CREATININE 0.72 01/14/2016   BUN 12 01/14/2016   NA 132 (L) 01/14/2016   K 3.5 01/14/2016   CL 102 01/14/2016   CO2 25 01/14/2016    Anesthesia Physical Anesthesia Plan  ASA: I  Anesthesia Plan: General   Post-op Pain Management:    Induction: Intravenous  Airway Management Planned: Oral ETT  Additional Equipment:   Intra-op Plan:   Post-operative Plan: Extubation in OR  Informed Consent: I have reviewed the patients History and Physical, chart, labs and discussed the procedure including the risks, benefits and alternatives for the proposed anesthesia with the patient or authorized representative who has indicated his/her understanding and acceptance.   Dental advisory given  Plan Discussed with:   Anesthesia Plan Comments:        Anesthesia Quick Evaluation

## 2016-08-17 ENCOUNTER — Encounter (HOSPITAL_COMMUNITY): Payer: Self-pay | Admitting: Emergency Medicine

## 2016-08-17 ENCOUNTER — Encounter (HOSPITAL_COMMUNITY)
Admission: RE | Disposition: A | Discharge status: Home / Self Care | Payer: Self-pay | Source: Ambulatory Visit | Attending: Obstetrics & Gynecology

## 2016-08-17 ENCOUNTER — Ambulatory Visit (HOSPITAL_COMMUNITY)
Admission: RE | Admit: 2016-08-17 | Discharge: 2016-08-17 | Disposition: A | Discharge status: Home / Self Care | Payer: Managed Care, Other (non HMO) | Source: Ambulatory Visit | Attending: Obstetrics & Gynecology | Admitting: Obstetrics & Gynecology

## 2016-08-17 ENCOUNTER — Ambulatory Visit (HOSPITAL_COMMUNITY): Payer: Managed Care, Other (non HMO) | Admitting: Anesthesiology

## 2016-08-17 DIAGNOSIS — N979 Female infertility, unspecified: Secondary | ICD-10-CM | POA: Diagnosis not present

## 2016-08-17 DIAGNOSIS — N971 Female infertility of tubal origin: Secondary | ICD-10-CM | POA: Insufficient documentation

## 2016-08-17 DIAGNOSIS — N838 Other noninflammatory disorders of ovary, fallopian tube and broad ligament: Secondary | ICD-10-CM | POA: Insufficient documentation

## 2016-08-17 DIAGNOSIS — N736 Female pelvic peritoneal adhesions (postinfective): Secondary | ICD-10-CM | POA: Diagnosis not present

## 2016-08-17 DIAGNOSIS — L910 Hypertrophic scar: Secondary | ICD-10-CM | POA: Insufficient documentation

## 2016-08-17 DIAGNOSIS — Z9089 Acquired absence of other organs: Secondary | ICD-10-CM | POA: Diagnosis not present

## 2016-08-17 HISTORY — PX: LAPAROSCOPY: SHX197

## 2016-08-17 HISTORY — DX: Family history of other specified conditions: Z84.89

## 2016-08-17 HISTORY — PX: LAPAROSCOPIC LYSIS OF ADHESIONS: SHX5905

## 2016-08-17 HISTORY — PX: CHROMOPERTUBATION: SHX6288

## 2016-08-17 HISTORY — PX: BILATERAL SALPINGECTOMY: SHX5743

## 2016-08-17 LAB — HCG, SERUM, QUALITATIVE: Preg, Serum: NEGATIVE

## 2016-08-17 SURGERY — LAPAROSCOPY, DIAGNOSTIC
Anesthesia: General | Site: Vagina | Laterality: Right

## 2016-08-17 MED ORDER — CEFAZOLIN SODIUM-DEXTROSE 2-4 GM/100ML-% IV SOLN
2.0000 g | INTRAVENOUS | Status: AC
  Administered 2016-08-17: 2 g via INTRAVENOUS

## 2016-08-17 MED ORDER — FENTANYL CITRATE (PF) 100 MCG/2ML IJ SOLN
INTRAMUSCULAR | Status: AC
  Filled 2016-08-17: qty 2

## 2016-08-17 MED ORDER — IBUPROFEN 800 MG PO TABS: 800.0000 mg | ORAL_TABLET | Freq: Three times a day (TID) | ORAL | 0 refills | Status: DC | PRN

## 2016-08-17 MED ORDER — LIDOCAINE HCL (CARDIAC) 20 MG/ML IV SOLN
INTRAVENOUS | Status: DC | PRN
  Administered 2016-08-17: 70 mg via INTRAVENOUS
  Administered 2016-08-17: 30 mg via INTRAVENOUS

## 2016-08-17 MED ORDER — HYDROMORPHONE HCL 1 MG/ML IJ SOLN
INTRAMUSCULAR | Status: AC
  Filled 2016-08-17: qty 1

## 2016-08-17 MED ORDER — HYDROCODONE-ACETAMINOPHEN 5-325 MG PO TABS
ORAL_TABLET | ORAL | Status: AC
  Filled 2016-08-17: qty 1

## 2016-08-17 MED ORDER — METHYLENE BLUE 0.5 % INJ SOLN
INTRAVENOUS | Status: DC | PRN
  Administered 2016-08-17: 1 mL

## 2016-08-17 MED ORDER — PROPOFOL 10 MG/ML IV BOLUS
INTRAVENOUS | Status: DC | PRN
  Administered 2016-08-17: 170 mg via INTRAVENOUS

## 2016-08-17 MED ORDER — ONDANSETRON HCL 4 MG/2ML IJ SOLN
INTRAMUSCULAR | Status: DC | PRN
  Administered 2016-08-17: 4 mg via INTRAVENOUS

## 2016-08-17 MED ORDER — ROCURONIUM BROMIDE 100 MG/10ML IV SOLN
INTRAVENOUS | Status: AC
  Filled 2016-08-17: qty 1

## 2016-08-17 MED ORDER — SODIUM CHLORIDE 0.9 % IJ SOLN
INTRAMUSCULAR | Status: AC
  Filled 2016-08-17: qty 10

## 2016-08-17 MED ORDER — PROMETHAZINE HCL 25 MG/ML IJ SOLN: 6.2500 mg | INTRAMUSCULAR | Status: DC | PRN

## 2016-08-17 MED ORDER — KETOROLAC TROMETHAMINE 30 MG/ML IJ SOLN
INTRAMUSCULAR | Status: DC | PRN
  Administered 2016-08-17: 30 mg via INTRAVENOUS
  Administered 2016-08-17: 30 mg via INTRAMUSCULAR

## 2016-08-17 MED ORDER — LIDOCAINE HCL (CARDIAC) 20 MG/ML IV SOLN
INTRAVENOUS | Status: AC
  Filled 2016-08-17: qty 5

## 2016-08-17 MED ORDER — KETOROLAC TROMETHAMINE 30 MG/ML IJ SOLN
INTRAMUSCULAR | Status: AC
Start: 2016-08-17 — End: 2016-08-17
  Filled 2016-08-17: qty 1

## 2016-08-17 MED ORDER — FENTANYL CITRATE (PF) 100 MCG/2ML IJ SOLN
INTRAMUSCULAR | Status: DC | PRN
  Administered 2016-08-17: 25 ug via INTRAVENOUS
  Administered 2016-08-17 (×2): 50 ug via INTRAVENOUS
  Administered 2016-08-17 (×3): 25 ug via INTRAVENOUS

## 2016-08-17 MED ORDER — HYDROCODONE-ACETAMINOPHEN 5-325 MG PO TABS: 1.0000 | ORAL_TABLET | Freq: Four times a day (QID) | ORAL | 0 refills | Status: DC | PRN

## 2016-08-17 MED ORDER — KETOROLAC TROMETHAMINE 30 MG/ML IJ SOLN
INTRAMUSCULAR | Status: AC
  Filled 2016-08-17: qty 1

## 2016-08-17 MED ORDER — METHYLENE BLUE 0.5 % INJ SOLN
INTRAVENOUS | Status: AC
  Filled 2016-08-17: qty 10

## 2016-08-17 MED ORDER — SILVER NITRATE-POT NITRATE 75-25 % EX MISC
CUTANEOUS | Status: AC
  Filled 2016-08-17: qty 1

## 2016-08-17 MED ORDER — LACTATED RINGERS IV SOLN
INTRAVENOUS | Status: DC
  Administered 2016-08-17 (×2): via INTRAVENOUS

## 2016-08-17 MED ORDER — MIDAZOLAM HCL 2 MG/2ML IJ SOLN
INTRAMUSCULAR | Status: DC | PRN
  Administered 2016-08-17: 1 mg via INTRAVENOUS

## 2016-08-17 MED ORDER — MIDAZOLAM HCL 2 MG/2ML IJ SOLN
INTRAMUSCULAR | Status: AC
  Filled 2016-08-17: qty 2

## 2016-08-17 MED ORDER — ROCURONIUM BROMIDE 100 MG/10ML IV SOLN
INTRAVENOUS | Status: DC | PRN
  Administered 2016-08-17: 35 mg via INTRAVENOUS
  Administered 2016-08-17: 15 mg via INTRAVENOUS

## 2016-08-17 MED ORDER — DEXAMETHASONE SODIUM PHOSPHATE 10 MG/ML IJ SOLN
INTRAMUSCULAR | Status: DC | PRN
  Administered 2016-08-17: 4 mg via INTRAVENOUS

## 2016-08-17 MED ORDER — BUPIVACAINE HCL (PF) 0.25 % IJ SOLN
INTRAMUSCULAR | Status: DC | PRN
  Administered 2016-08-17: 11 mL

## 2016-08-17 MED ORDER — HYDROMORPHONE HCL 1 MG/ML IJ SOLN
0.2500 mg | INTRAMUSCULAR | Status: DC | PRN
  Administered 2016-08-17 (×2): 0.5 mg via INTRAVENOUS

## 2016-08-17 MED ORDER — SCOPOLAMINE 1 MG/3DAYS TD PT72
1.0000 | MEDICATED_PATCH | Freq: Once | TRANSDERMAL | Status: DC
  Administered 2016-08-17: 1.5 mg via TRANSDERMAL

## 2016-08-17 MED ORDER — PROPOFOL 10 MG/ML IV BOLUS
INTRAVENOUS | Status: AC
  Filled 2016-08-17: qty 20

## 2016-08-17 MED ORDER — SUGAMMADEX SODIUM 200 MG/2ML IV SOLN
INTRAVENOUS | Status: DC | PRN
  Administered 2016-08-17: 169.2 mg via INTRAVENOUS

## 2016-08-17 MED ORDER — SCOPOLAMINE 1 MG/3DAYS TD PT72
MEDICATED_PATCH | TRANSDERMAL | Status: AC
  Administered 2016-08-17: 1.5 mg via TRANSDERMAL
  Filled 2016-08-17: qty 1

## 2016-08-17 MED ORDER — BUPIVACAINE HCL (PF) 0.25 % IJ SOLN
INTRAMUSCULAR | Status: AC
  Filled 2016-08-17: qty 30

## 2016-08-17 MED ORDER — ONDANSETRON HCL 4 MG/2ML IJ SOLN
INTRAMUSCULAR | Status: AC
  Filled 2016-08-17: qty 2

## 2016-08-17 MED ORDER — DEXAMETHASONE SODIUM PHOSPHATE 4 MG/ML IJ SOLN
INTRAMUSCULAR | Status: AC
  Filled 2016-08-17: qty 1

## 2016-08-17 MED ORDER — HYDROCODONE-ACETAMINOPHEN 5-325 MG PO TABS
1.0000 | ORAL_TABLET | Freq: Once | ORAL | Status: AC
  Administered 2016-08-17: 1 via ORAL

## 2016-08-17 MED ORDER — KETOROLAC TROMETHAMINE 30 MG/ML IJ SOLN: 30.0000 mg | Freq: Once | INTRAMUSCULAR | Status: DC | PRN

## 2016-08-17 MED ORDER — SODIUM CHLORIDE 0.9 % IJ SOLN
INTRAMUSCULAR | Status: DC | PRN
  Administered 2016-08-17: 10 mL

## 2016-08-17 SURGICAL SUPPLY — 45 items
APL SRG 38 LTWT LNG FL B (MISCELLANEOUS)
APPLICATOR ARISTA FLEXITIP XL (MISCELLANEOUS) IMPLANT
BRR ADH 6X5 SEPRAFILM 1 SHT (MISCELLANEOUS) ×4
CABLE HIGH FREQUENCY MONO STRZ (ELECTRODE) ×2 IMPLANT
CATH ROBINSON RED A/P 16FR (CATHETERS) ×4 IMPLANT
CLOTH BEACON ORANGE TIMEOUT ST (SAFETY) ×6 IMPLANT
DRSG OPSITE POSTOP 3X4 (GAUZE/BANDAGES/DRESSINGS) IMPLANT
DURAPREP 26ML APPLICATOR (WOUND CARE) ×6 IMPLANT
GLOVE BIOGEL PI IND STRL 7.0 (GLOVE) ×8 IMPLANT
GLOVE BIOGEL PI INDICATOR 7.0 (GLOVE) ×4
GLOVE ECLIPSE 6.5 STRL STRAW (GLOVE) ×12 IMPLANT
HEMOSTAT ARISTA ABSORB 3G PWDR (MISCELLANEOUS) IMPLANT
IV STOPCOCK 4 WAY 40  W/Y SET (IV SOLUTION) ×2
IV STOPCOCK 4 WAY 40 W/Y SET (IV SOLUTION) IMPLANT
LIQUID BAND (GAUZE/BANDAGES/DRESSINGS) ×6 IMPLANT
MANIPULATOR UTERINE 4.5 ZUMI (MISCELLANEOUS) IMPLANT
NEEDLE INSUFFLATION 120MM (ENDOMECHANICALS) ×6 IMPLANT
PACK LAPAROSCOPY BASIN (CUSTOM PROCEDURE TRAY) ×6 IMPLANT
PACK TRENDGUARD 450 HYBRID PRO (MISCELLANEOUS) IMPLANT
PACK TRENDGUARD 600 HYBRD PROC (MISCELLANEOUS) IMPLANT
PROTECTOR NERVE ULNAR (MISCELLANEOUS) ×12 IMPLANT
SEALER TISSUE G2 CVD JAW 35 (ENDOMECHANICALS) IMPLANT
SEALER TISSUE G2 CVD JAW 45CM (ENDOMECHANICALS)
SEPRAFILM MEMBRANE 5X6 (MISCELLANEOUS) ×2 IMPLANT
SET IRRIG TUBING LAPAROSCOPIC (IRRIGATION / IRRIGATOR) IMPLANT
SHEARS HARMONIC ACE PLUS 36CM (ENDOMECHANICALS) ×2 IMPLANT
SLEEVE XCEL OPT CAN 5 100 (ENDOMECHANICALS) ×6 IMPLANT
SUT VIC AB 4-0 SH 27 (SUTURE)
SUT VIC AB 4-0 SH 27XANBCTRL (SUTURE) IMPLANT
SUT VICRYL 0 UR6 27IN ABS (SUTURE) IMPLANT
SUT VICRYL 4-0 PS2 18IN ABS (SUTURE) ×6 IMPLANT
SYR 30ML LL (SYRINGE) ×2 IMPLANT
SYR 50ML LL SCALE MARK (SYRINGE) ×6 IMPLANT
SYRINGE 60CC LL (MISCELLANEOUS) ×1 IMPLANT
TOWEL OR 17X24 6PK STRL BLUE (TOWEL DISPOSABLE) ×12 IMPLANT
TRAY FOLEY BAG SILVER LF 14FR (CATHETERS) IMPLANT
TRENDGUARD 450 HYBRID PRO PACK (MISCELLANEOUS)
TRENDGUARD 600 HYBRID PROC PK (MISCELLANEOUS)
TROCAR ADV FIXATION 5X100MM (TROCAR) ×6 IMPLANT
TROCAR BALLN 12MMX100 BLUNT (TROCAR) IMPLANT
TROCAR OPTI TIP 5M 100M (ENDOMECHANICALS) ×2 IMPLANT
TROCAR XCEL NON-BLD 11X100MML (ENDOMECHANICALS) IMPLANT
TROCAR XCEL NON-BLD 5MMX100MML (ENDOMECHANICALS) ×2 IMPLANT
WARMER LAPAROSCOPE (MISCELLANEOUS) ×6 IMPLANT
WATER STERILE IRR 1000ML POUR (IV SOLUTION) ×6 IMPLANT

## 2016-08-17 NOTE — Anesthesia Procedure Notes (Signed)
Performed by: Nikaya Nasby C       

## 2016-08-17 NOTE — Anesthesia Postprocedure Evaluation (Signed)
Anesthesia Post Note  Patient: Geroge BasemanHeather M Raisanen  Procedure(s) Performed: Procedure(s) (LRB): LAPAROSCOPY DIAGNOSTIC with left paratubal cystectomy (N/A) CHROMOPERTUBATION (Bilateral) RIGHT SALPINGECTOMY (Right) LAPAROSCOPIC LYSIS OF ADHESIONS  Patient location during evaluation: PACU Anesthesia Type: General Level of consciousness: awake and alert Pain management: pain level controlled Vital Signs Assessment: post-procedure vital signs reviewed and stable Respiratory status: spontaneous breathing, nonlabored ventilation, respiratory function stable and patient connected to nasal cannula oxygen Cardiovascular status: blood pressure returned to baseline and stable Postop Assessment: no signs of nausea or vomiting Anesthetic complications: no     Last Vitals:  Vitals:   08/17/16 1030 08/17/16 1045  BP: 108/71 118/78  Pulse: 75 (!) 103  Resp: 19 18  Temp:  36.7 C    Last Pain:  Vitals:   08/17/16 1045  TempSrc:   PainSc: 1    Pain Goal: Patients Stated Pain Goal: 3 (08/17/16 1015)               Kennieth RadFitzgerald, Kalyiah Saintil E

## 2016-08-17 NOTE — Anesthesia Procedure Notes (Signed)
Date/Time: 08/17/2016 7:31 AM Performed by: Suella GroveMOORE, Tishara Pizano C Pre-anesthesia Checklist: Patient identified, Emergency Drugs available, Suction available, Patient being monitored and Timeout performed Patient Re-evaluated:Patient Re-evaluated prior to inductionOxygen Delivery Method: Circle system utilized and Simple face mask Preoxygenation: Pre-oxygenation with 100% oxygen Intubation Type: IV induction Ventilation: Mask ventilation without difficulty Tube type: Oral Tube size: 7.0 mm Number of attempts: 1 Airway Equipment and Method: Stylet Placement Confirmation: ETT inserted through vocal cords under direct vision,  positive ETCO2 and breath sounds checked- equal and bilateral Secured at: 21 (com) cm Tube secured with: Tape Dental Injury: Teeth and Oropharynx as per pre-operative assessment

## 2016-08-17 NOTE — Op Note (Signed)
08/17/2016  9:31 AM  PATIENT:  Kathy BasemanHeather M Hochman  30 y.o. 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 Gertie ExonJill Jertson   ANESTHESIA:   general  ESTIMATED BLOOD LOSS: 5cc  BLOOD ADMINISTERED:none   FLUIDS: 1300ccLR  UOP: 800cc clear UOP  SPECIMEN:  Right fallopian tube and left paratubal cysts x 2  DISPOSITION OF SPECIMEN:  PATHOLOGY  FINDINGS: adhesions along RLQ with prior appendectomy had been performed, shortened and clubbed right tube, +fluid spill from left tube and no fluid spill from right tube, two staples present (one on PCDS and on in ACDS) that were removed  DESCRIPTION OF OPERATION: Patient is taken to the operating room. She is placed in the supine position. She is a running IV in place. Informed consent was present on the chart. SCDs on her lower extremities and functioning properly. General endotracheal anesthesia was administered by the anesthesia staff without difficulty.  Once adequate anesthesia was confirmed the legs are placed in the low lithotomy position in [place] stirrups. Her arms were tucked by the side.  Time out was performed.    Dura prep was then used to prep the abdomen and Betadine was used to prep the inner thighs, perineum and vagina. Once 3 minutes had past the patient was draped in a normal standard fashion. The legs were lifted to the high lithotomy position. The cervix was visualized by placing a bivalved speculum in the posterior aspect of the vagina.   The anterior lip of the cervix was grasped with single-tooth tenaculum.  Acorn uterine manipulator was placed at cervical os and attached to the tenaculum as a means of manipulating the uterus during the procedure.  The speculum was removed as well.  A Foley catheter was placed to straight drain. Clear urine was noted. Legs were lowered to the low lithotomy position and attention was turned the abdomen.  The umbilicus was everted.  A Veress needle was obtained. Syringe of sterile saline was placed on the Veress needle.  This was passed into the umbilicus, opened, and passed just until the fluid started to drip.  Then low flow CO2 gas was attached the needle and the pneumoperitoneum was achieved without difficulty. Once 2.8 liters of gas was in the abdomen the Veress needle was removed and a 5 millimeter non-bladed Optiview trocar and port were passed directly to the abdomen. The laparoscope was then used to confirm intraperitoneal placement.  The upper abdomen was surveyed. No adhesions or abnormal findings were noted. Patient was then placed in Trendelenburg positioning. No evidence of endometriosis was noted in the anterior cul-de-sac,  the posterior cul-de-sac or anywhere around the ovaries.  There were 2 staples from her prior appendectomy. One is located in the anterior cul-de-sac and the second was located in the posterior cul-de-sac. Both ovaries appeared normal. The left fallopian tube appeared normal except for 2 small paratubal cyst that were extending from some fatty tissue on the left fallopian tube.  The right fallopian tube was much shorter than the left tube and the fimbriated end appeared clubbed. Methylene blue that had been mixed with normal saline (  10 cc and 500 cc of normal saline) was injected through the acorn uterine manipulator. There is easy spill from the left fallopian tube. No spill was noted from the right tube despite manipulation of the uterus and of the fallopian tube. This was  consistent with findings on sonohysterography. As patient had been counseled in the office, decision was made to proceed with right cell project me.   Decision made to place 2 more ports 1 in the right and the other in the left lower quadrant.  Prior left lower quadrant incision had a keloid that was present so this keloid was excised after transillumination of the abdominal wall and placement sites for the 2 ports were noted.  0.25% marcaine was used to anesthetize the skin.  5mm skin incisions were made and then 5mm bladed ports were placed.    Ureters were identifies.  Using the harmonic scalpel, the right tube was excised up to the level of the cornu of the uterus. The tube was removed. The 2 paratubal cysts on the left fallopian tube removed. Care was taken to be sure that this was nowhere near the fimbriated end. Then the adhesions on the right side of the abdomen with the prior appendectomy had been performed were taken down using the harmonic scalpel.  This was done without difficulty.   At this point the pneumoperitoneum was relieved to to watch for any bleeding. Excellent hemostasis present. Decision was made to place adhesion prevention along all surgical sites and specifically where the right abdominal adhesions of the removed. Pelvis was irrigated and the irrigant was removed including as much of the methylene blue/saline mixture that had been used for the chromopertubation.  A Seprafilm slurry was made crushing a sheet of Seprafilm in normal saline. Using a red rubber Foley catheter that was passed through the right lower quadrant port the slurry was placed along all surgical edges.  At this point the procedure was completed.   Pneumoperitoneum was relieved. The right and left lower quadrant ports were removed under direct visualization.  Patient was taken out of Trendelenburg positioning.  3 forced breaths were given by the CRNA before the midline port was removed. The left lower quadrant  incision was closed with a 4-0 Monocryl suture with a subcuticular stitch. The right lower quadrant incision and umbilical incision was closed using Dermabond. Dermabond was also placed on the left lower quadrant incision.   Attention was then turned the vagina.   the acorn uterine manipulator and tenaculum were removed off the cervix. Minimal bleeding was noted. The Foley catheter was removed.  Patient's legs were positioned in the supine position. Sponge, lap, needle, initially counts were correct x2. Patient tolerated the procedure very well. She was awakened from anesthesia, extubated and taken to recovery in stable condition.   COUNTS:  YES  PLAN OF CARE: Transfer to PACU

## 2016-08-17 NOTE — H&P (Addendum)
Zane HeraldHeather M Carte is an 30 y.o. female  G1P1 MWF here for diagnostic laparoscopy with chromopertubation and possible right salpingectomy due to secondary infertility.  Pt's first pregnancy was with a prior partner.  She had an appendectomy earlier this year and since that time has experienced some RLQ pain as well as inability to conceive.  Pt has [facility] in the office which showed no fluid passing through right tube.  We discussed proceeding with laparoscopy for additional evaluation vs. HSG.  Pt decided to proceed with this as the HSG could also be abnormal and she may need laparoscopy.  She is aware that the tube will be removed if fluid does not pass through or if it has abnormal appearance.  Family present with pt today.  Questions answered.  Risks have been discussed in prior office visit.2  Pertinent Gynecological History: Menses: regular Contraception: none DES exposure: denies Blood transfusions: none Sexually transmitted diseases: no past history Previous GYN Procedures: none  Last mammogram: n/a Date: n/a Last pap: normal Date: 10/15 OB History: G1, P1   Menstrual History: Patient's last menstrual period was 08/09/2016.    Past Medical History:  Diagnosis Date  . Appendicitis   . Family history of adverse reaction to anesthesia    mother has vomiting after anesthesia    Past Surgical History:  Procedure Laterality Date  . LAPAROSCOPIC APPENDECTOMY N/A 01/14/2016   Procedure: APPENDECTOMY LAPAROSCOPIC;  Surgeon: Lattie Hawichard E Cooper, MD;  Location: [facility] ORS;  Service: General;  Laterality: N/A;    Family History  Problem Relation Age of Onset  . Breast cancer Mother [id number]  . Osteoarthritis Maternal Grandmother   . Hypertension Maternal Grandmother   . Cancer Maternal Grandmother     blood cancer?  . Cancer Maternal Grandfather [id number]    prostate cancer    Social History:  reports that she has never smoked. She has never used smokeless tobacco. She reports that she drinks about 0.6 oz  of alcohol per week . She reports that she does not use drugs.  Allergies: No Known Allergies  Prescriptions Prior to Admission  Medication Sig Dispense Refill Last Dose  . Prenatal Vit-Fe Fumarate-FA (PRENATAL VITAMIN PO) Take 1 tablet by mouth every morning.    Past Month at Unknown time  . doxycycline (VIBRAMYCIN) 100 MG capsule Take 1 capsule (100 mg total) by mouth 2 (two) times daily. Take BID x 3 days.  Take with food as can cause GI distress. (Patient not taking: Reported on 07/30/2016) 6 capsule 0 Not Taking at Unknown time    ROS Complete ROS were negative  Blood pressure 134/87, pulse 90, temperature 97.8 F (36.6 C), temperature source Oral, resp. rate 15, last menstrual period 08/09/2016, SpO2 100 %. Physical Exam  Constitutional: She is oriented to person, place, and time. She appears well-developed and well-nourished.  Cardiovascular: Normal rate and regular rhythm.   Respiratory: Effort normal and breath sounds normal.  Neurological: She is alert and oriented to person, place, and time.  Skin: Skin is warm and dry.  Psychiatric: She has a normal mood and affect.    Results for orders placed or performed during the hospital encounter of 08/17/16 (from the past 24 hour(s))  hCG, serum, qualitative     Status: None   Collection Time: 08/17/16  6:10 AM  Result Value Ref Range   Preg, Serum NEGATIVE NEGATIVE    No results found.  Assessment/Plan: 30 yo G1P1 MWF [facility] and secondary infertility here  for diagnostic laparoscopy, chromopertubation, possible right salpingectomy.  Questions answered.  Pt here and ready to proceed.  Valentina ShaggyMILLER, Margueritte Guthridge SUZANNE 08/17/2016, 7:01 AM

## 2016-08-17 NOTE — Anesthesia Procedure Notes (Signed)
Procedure Name: Intubation Date/Time: 08/17/2016 7:13 AM Performed by: Suella GroveMOORE, Zhamir Pirro C Pre-anesthesia Checklist: Patient identified, Emergency Drugs available, Suction available, Patient being monitored and Timeout performed Patient Re-evaluated:Patient Re-evaluated prior to inductionOxygen Delivery Method: Circle system utilized Preoxygenation: Pre-oxygenation with 100% oxygen Ventilation: Mask ventilation without difficulty Laryngoscope Size: Mac and 3 Grade View: Grade II Tube type: Oral Tube size: 7.0 mm Number of attempts: 1 Airway Equipment and Method: Stylet Placement Confirmation: ETT inserted through vocal cords under direct vision,  positive ETCO2 and breath sounds checked- equal and bilateral Secured at: 21 cm Tube secured with: Tape Dental Injury: Teeth and Oropharynx as per pre-operative assessment

## 2016-08-17 NOTE — Transfer of Care (Signed)
Immediate Anesthesia Transfer of Care Note  Patient: Kathy BasemanHeather M Aguilar  Procedure(s) Performed: Procedure(s): LAPAROSCOPY DIAGNOSTIC (N/A) CHROMOPERTUBATION (Bilateral) RIGHT SALPINGECTOMY (Right)  Patient Location: PACU  Anesthesia Type:General  Level of Consciousness: awake, alert  and patient cooperative  Airway & Oxygen Therapy: Patient Spontanous Breathing and Patient connected to nasal cannula oxygen  Post-op Assessment: Report given to RN and Post -op Vital signs reviewed and stable  Post vital signs: Reviewed and stable  Last Vitals:  Vitals:   08/17/16 0608  BP: 134/87  Pulse: 90  Resp: 15  Temp: 36.6 C    Last Pain:  Vitals:   08/17/16 0608  TempSrc: Oral      Patients Stated Pain Goal: 3 (08/17/16 [id number])  Complications: No apparent anesthesia complications

## 2016-08-17 NOTE — Discharge Instructions (Addendum)
Post-surgical Instructions, Outpatient Surgery  You may expect to feel dizzy, weak, and drowsy for as long as 24 hours after receiving the medicine that made you sleep (anesthetic). For the first 24 hours after your surgery:    Do not drive a car, ride a bicycle, participate in physical activities, or take public transportation until you are done taking narcotic pain medicines or as directed by Dr. Hyacinth MeekerMiller.   Do not drink alcohol or take tranquilizers.   Do not take medicine that has not been prescribed by your physicians.   Do not sign important papers or make important decisions while on narcotic pain medicines.   Have a responsible person with you.   CARE OF INCISION  If you have a bandage, you may remove it in one day.  If there are steri-strips or dermabond, just let this loosen on its own.   You may shower on the first day after your surgery.  Do not sit in a tub bath for one week.  Avoid heavy lifting (more than 10 pounds/4.5 kilograms), pushing, or pulling.   Avoid activities that may risk injury to your incisions.   PAIN MANAGEMENT  Motrin 800mg .  (This is the same as 4-200mg  over the counter tablets of Motrin or ibuprofen.)  You may take this every eight hours or as needed for cramping.    Vicodin 5/325mg .  For more severe pain, take one or two tablets every four to six hours as needed for pain control.  (Remember that narcotic pain medications increase your risk of constipation.  If this becomes a problem, you may take an over the counter stool softener like Colace 100mg  up to four times a day.)  DO'S AND DON'T'S  Do not take a tub bath for one week.  You may shower on the first day after your surgery  Do not do any heavy lifting for one to two weeks.  This increases the chance of bleeding.  Do move around as you feel able.  Stairs are fine.  You may begin to exercise again as you feel able.  Do not lift any weights for two weeks.  Do not put anything in the vagina for  two weeks--no tampons, intercourse, or douching.    REGULAR MEDIATIONS/VITAMINS:  You may restart all of your regular medications as prescribed.  You may restart all of your vitamins as you normally take them.    PLEASE CALL OR SEEK MEDICAL CARE IF:  You have persistent nausea and vomiting.   You have trouble eating or drinking.   You have an oral temperature above 100.5.   You have constipation that is not helped by adjusting diet or increasing fluid intake. Pain medicines are a common cause of constipation.   You have heavy vaginal bleeding  You have redness or drainage from your incision(s) or there is increasing pain or tenderness near or in the surgical site.    You may remove the patch behind your ear on or before Friday 08/20/16. Wash your hands with soap and water after any contact with the patch.  Do not take ibuprofen/Motrin/Advil products before 3:00pm 08/17/16.

## 2016-08-18 ENCOUNTER — Encounter (HOSPITAL_COMMUNITY): Payer: Self-pay | Admitting: Obstetrics & Gynecology

## 2016-08-23 ENCOUNTER — Ambulatory Visit (INDEPENDENT_AMBULATORY_CARE_PROVIDER_SITE_OTHER): Payer: Managed Care, Other (non HMO) | Admitting: Internal Medicine

## 2016-08-23 ENCOUNTER — Encounter: Payer: Self-pay | Admitting: Internal Medicine

## 2016-08-23 VITALS — BP 120/74 | HR 84 | Temp 98.0°F | Ht 66.0 in | Wt 186.8 lb

## 2016-08-23 DIAGNOSIS — Z0000 Encounter for general adult medical examination without abnormal findings: Secondary | ICD-10-CM | POA: Diagnosis not present

## 2016-08-23 DIAGNOSIS — Z0001 Encounter for general adult medical examination with abnormal findings: Secondary | ICD-10-CM

## 2016-08-23 LAB — COMPREHENSIVE METABOLIC PANEL
ALBUMIN: 4.5 g/dL (ref 3.5–5.2)
ALK PHOS: 59 U/L (ref 39–117)
ALT: 10 U/L (ref 0–35)
AST: 15 U/L (ref 0–37)
BUN: 10 mg/dL (ref 6–23)
CHLORIDE: 102 meq/L (ref 96–112)
CO2: 31 mEq/L (ref 19–32)
CREATININE: 0.76 mg/dL (ref 0.40–1.20)
Calcium: 9.6 mg/dL (ref 8.4–10.5)
GFR: 93.85 mL/min (ref >60.00)
GLUCOSE: 89 mg/dL (ref 70–99)
POTASSIUM: 3.8 meq/L (ref 3.5–5.1)
SODIUM: 140 meq/L (ref 135–145)
TOTAL PROTEIN: 7.3 g/dL (ref 6.0–8.3)
Total Bilirubin: 0.3 mg/dL (ref 0.2–1.2)

## 2016-08-23 LAB — CBC
HEMATOCRIT: 42.1 % (ref 36.0–46.0)
HEMOGLOBIN: 14.3 g/dL (ref 12.0–15.0)
MCHC: 34 g/dL (ref 30.0–36.0)
MCV: 86.9 fl (ref 78.0–100.0)
Platelets: 306 10*3/uL (ref 150.0–400.0)
RBC: 4.84 Mil/uL (ref 3.87–5.11)
RDW: 12.8 % (ref 11.5–15.5)
WBC: 10.6 10*3/uL — ABNORMAL HIGH (ref 4.0–10.5)

## 2016-08-23 LAB — HEMOGLOBIN A1C: HEMOGLOBIN A1C: 5.1 % (ref 4.6–6.5)

## 2016-08-23 LAB — LIPID PANEL
CHOLESTEROL: 184 mg/dL (ref 0–200)
HDL: 44.7 mg/dL (ref >39.00)
NonHDL: 139.34
Total CHOL/HDL Ratio: 4
Triglycerides: 224 mg/dL — ABNORMAL HIGH (ref 0.0–149.0)
VLDL: 44.8 mg/dL — ABNORMAL HIGH (ref 0.0–40.0)

## 2016-08-23 LAB — LDL CHOLESTEROL, DIRECT: LDL DIRECT: 123 mg/dL

## 2016-08-23 NOTE — Patient Instructions (Signed)
Health Maintenance, Female Adopting a healthy lifestyle and getting preventive care can go a long way to promote health and wellness. Talk with your health care provider about what schedule of regular examinations is right for you. This is a good chance for you to check in with your provider about disease prevention and staying healthy. In between checkups, there are plenty of things you can do on your own. Experts have done a lot of research about which lifestyle changes and preventive measures are most likely to keep you healthy. Ask your health care provider for more information. WEIGHT AND DIET  Eat a healthy diet  Be sure to include plenty of vegetables, fruits, low-fat dairy products, and lean protein.  Do not eat a lot of foods high in solid fats, added sugars, or salt.  Get regular exercise. This is one of the most important things you can do for your health.  Most adults should exercise for at least 150 minutes each week. The exercise should increase your heart rate and make you sweat (moderate-intensity exercise).  Most adults should also do strengthening exercises at least twice a week. This is in addition to the moderate-intensity exercise.  Maintain a healthy weight  Body mass index (BMI) is a measurement that can be used to identify possible weight problems. It estimates body fat based on height and weight. Your health care provider can help determine your BMI and help you achieve or maintain a healthy weight.  For females 20 years of age and older:   A BMI below 18.5 is considered underweight.  A BMI of 18.5 to 24.9 is normal.  A BMI of 25 to 29.9 is considered overweight.  A BMI of 30 and above is considered obese.  Watch levels of cholesterol and blood lipids  You should start having your blood tested for lipids and cholesterol at 31 years of age, then have this test every 5 years.  You may need to have your cholesterol levels checked more often if:  Your lipid  or cholesterol levels are high.  You are older than 31 years of age.  You are at high risk for heart disease.  CANCER SCREENING   Lung Cancer  Lung cancer screening is recommended for adults 55-80 years old who are at high risk for lung cancer because of a history of smoking.  A yearly low-dose CT scan of the lungs is recommended for people who:  Currently smoke.  Have quit within the past 15 years.  Have at least a 30-pack-year history of smoking. A pack year is smoking an average of one pack of cigarettes a day for 1 year.  Yearly screening should continue until it has been 15 years since you quit.  Yearly screening should stop if you develop a health problem that would prevent you from having lung cancer treatment.  Breast Cancer  Practice breast self-awareness. This means understanding how your breasts normally appear and feel.  It also means doing regular breast self-exams. Let your health care provider know about any changes, no matter how small.  If you are in your 20s or 30s, you should have a clinical breast exam (CBE) by a health care provider every 1-3 years as part of a regular health exam.  If you are 40 or older, have a CBE every year. Also consider having a breast X-ray (mammogram) every year.  If you have a family history of breast cancer, talk to your health care provider about genetic screening.  If you   are at high risk for breast cancer, talk to your health care provider about having an MRI and a mammogram every year.  Breast cancer gene (BRCA) assessment is recommended for women who have family members with BRCA-related cancers. BRCA-related cancers include:  Breast.  Ovarian.  Tubal.  Peritoneal cancers.  Results of the assessment will determine the need for genetic counseling and BRCA1 and BRCA2 testing. Cervical Cancer Your health care provider may recommend that you be screened regularly for cancer of the pelvic organs (ovaries, uterus, and  vagina). This screening involves a pelvic examination, including checking for microscopic changes to the surface of your cervix (Pap test). You may be encouraged to have this screening done every 3 years, beginning at age 21.  For women ages 30-65, health care providers may recommend pelvic exams and Pap testing every 3 years, or they may recommend the Pap and pelvic exam, combined with testing for human papilloma virus (HPV), every 5 years. Some types of HPV increase your risk of cervical cancer. Testing for HPV may also be done on women of any age with unclear Pap test results.  Other health care providers may not recommend any screening for nonpregnant women who are considered low risk for pelvic cancer and who do not have symptoms. Ask your health care provider if a screening pelvic exam is right for you.  If you have had past treatment for cervical cancer or a condition that could lead to cancer, you need Pap tests and screening for cancer for at least 20 years after your treatment. If Pap tests have been discontinued, your risk factors (such as having a new sexual partner) need to be reassessed to determine if screening should resume. Some women have medical problems that increase the chance of getting cervical cancer. In these cases, your health care provider may recommend more frequent screening and Pap tests. Colorectal Cancer  This type of cancer can be detected and often prevented.  Routine colorectal cancer screening usually begins at 31 years of age and continues through 31 years of age.  Your health care provider may recommend screening at an earlier age if you have risk factors for colon cancer.  Your health care provider may also recommend using home test kits to check for hidden blood in the stool.  A small camera at the end of a tube can be used to examine your colon directly (sigmoidoscopy or colonoscopy). This is done to check for the earliest forms of colorectal  cancer.  Routine screening usually begins at age 50.  Direct examination of the colon should be repeated every 5-10 years through 31 years of age. However, you may need to be screened more often if early forms of precancerous polyps or small growths are found. Skin Cancer  Check your skin from head to toe regularly.  Tell your health care provider about any new moles or changes in moles, especially if there is a change in a mole's shape or color.  Also tell your health care provider if you have a mole that is larger than the size of a pencil eraser.  Always use sunscreen. Apply sunscreen liberally and repeatedly throughout the day.  Protect yourself by wearing long sleeves, pants, a wide-brimmed hat, and sunglasses whenever you are outside. HEART DISEASE, DIABETES, AND HIGH BLOOD PRESSURE   High blood pressure causes heart disease and increases the risk of stroke. High blood pressure is more likely to develop in:  People who have blood pressure in the high end   of the normal range (130-139/85-89 mm Hg).  People who are overweight or obese.  People who are African American.  If you are 38-23 years of age, have your blood pressure checked every 3-5 years. If you are 61 years of age or older, have your blood pressure checked every year. You should have your blood pressure measured twice--once when you are at a hospital or clinic, and once when you are not at a hospital or clinic. Record the average of the two measurements. To check your blood pressure when you are not at a hospital or clinic, you can use:  An automated blood pressure machine at a pharmacy.  A home blood pressure monitor.  If you are between 45 years and 39 years old, ask your health care provider if you should take aspirin to prevent strokes.  Have regular diabetes screenings. This involves taking a blood sample to check your fasting blood sugar level.  If you are at a normal weight and have a low risk for diabetes,  have this test once every three years after 31 years of age.  If you are overweight and have a high risk for diabetes, consider being tested at a younger age or more often. PREVENTING INFECTION  Hepatitis B  If you have a higher risk for hepatitis B, you should be screened for this virus. You are considered at high risk for hepatitis B if:  You were born in a country where hepatitis B is common. Ask your health care provider which countries are considered high risk.  Your parents were born in a high-risk country, and you have not been immunized against hepatitis B (hepatitis B vaccine).  You have HIV or AIDS.  You use needles to inject street drugs.  You live with someone who has hepatitis B.  You have had sex with someone who has hepatitis B.  You get hemodialysis treatment.  You take certain medicines for conditions, including cancer, organ transplantation, and autoimmune conditions. Hepatitis C  Blood testing is recommended for:  Everyone born from [birth year] through 1966.  Anyone with known risk factors for hepatitis C. Sexually transmitted infections (STIs)  You should be screened for sexually transmitted infections (STIs) including gonorrhea and chlamydia if:  You are sexually active and are younger than 31 years of age.  You are older than 31 years of age and your health care provider tells you that you are at risk for this type of infection.  Your sexual activity has changed since you were last screened and you are at an increased risk for chlamydia or gonorrhea. Ask your health care provider if you are at risk.  If you do not have HIV, but are at risk, it may be recommended that you take a prescription medicine daily to prevent HIV infection. This is called pre-exposure prophylaxis (PrEP). You are considered at risk if:  You are sexually active and do not regularly use condoms or know the HIV status of your partner(s).  You take drugs by injection.  You are sexually  active with a partner who has HIV. Talk with your health care provider about whether you are at high risk of being infected with HIV. If you choose to begin PrEP, you should first be tested for HIV. You should then be tested every 3 months for as long as you are taking PrEP.  PREGNANCY   If you are premenopausal and you may become pregnant, ask your health care provider about preconception counseling.  If you may  become pregnant, take 400 to 800 micrograms (mcg) of folic acid every day.  If you want to prevent pregnancy, talk to your health care provider about birth control (contraception). OSTEOPOROSIS AND MENOPAUSE   Osteoporosis is a disease in which the bones lose minerals and strength with aging. This can result in serious bone fractures. Your risk for osteoporosis can be identified using a bone density scan.  If you are 61 years of age or older, or if you are at risk for osteoporosis and fractures, ask your health care provider if you should be screened.  Ask your health care provider whether you should take a calcium or vitamin D supplement to lower your risk for osteoporosis.  Menopause may have certain physical symptoms and risks.  Hormone replacement therapy may reduce some of these symptoms and risks. Talk to your health care provider about whether hormone replacement therapy is right for you.  HOME CARE INSTRUCTIONS   Schedule regular health, dental, and eye exams.  Stay current with your immunizations.   Do not use any tobacco products including cigarettes, chewing tobacco, or electronic cigarettes.  If you are pregnant, do not drink alcohol.  If you are breastfeeding, limit how much and how often you drink alcohol.  Limit alcohol intake to no more than 1 drink per day for nonpregnant women. One drink equals 12 ounces of beer, 5 ounces of wine, or 1 ounces of hard liquor.  Do not use street drugs.  Do not share needles.  Ask your health care provider for help if  you need support or information about quitting drugs.  Tell your health care provider if you often feel depressed.  Tell your health care provider if you have ever been abused or do not feel safe at home.   This information is not intended to replace advice given to you by your health care provider. Make sure you discuss any questions you have with your health care provider.   Document Released: 04/12/2011 Document Revised: 10/18/2014 Document Reviewed: 08/29/2013 Elsevier Interactive Patient Education [employer].

## 2016-08-23 NOTE — Progress Notes (Signed)
HPI  Pt presents to the clinic today to establish care. She has not had a PCP in many years but has been seeing GYN.  Flu: never Tetanus: 07/2015 Pap Smear: 07/2014 Dentist: biannually  Diet: She does eat meat. She consumes fruits and veggies daily. She does eat some fried foods. She drinks mostly water. Exercise: None  Past Medical History:  Diagnosis Date  . Appendicitis   . Family history of adverse reaction to anesthesia    mother has vomiting after anesthesia    Current Outpatient Prescriptions  Medication Sig Dispense Refill  . HYDROcodone-acetaminophen (NORCO/VICODIN) 5-325 MG tablet Take 1-2 tablets by mouth every 6 (six) hours as needed for moderate pain or severe pain. 30 tablet 0  . ibuprofen (ADVIL,MOTRIN) 800 MG tablet Take 1 tablet (800 mg total) by mouth every 8 (eight) hours as needed. 30 tablet 0  . Prenatal Vit-Fe Fumarate-FA (PRENATAL VITAMIN PO) Take 1 tablet by mouth every morning.      No current facility-administered medications for this visit.     No Known Allergies  Family History  Problem Relation Age of Onset  . Breast cancer Mother [id number]  . Osteoarthritis Maternal Grandmother   . Hypertension Maternal Grandmother   . Cancer Maternal Grandmother     blood cancer?  . Kidney disease Maternal Grandmother   . Cancer Maternal Grandfather [id number]    prostate cancer  . Kidney disease Paternal Grandmother     Social History   Social History  . Marital status: Married    Spouse name: N/A  . Number of children: N/A  . Years of education: N/A   Occupational History  . Not on file.   Social History Main Topics  . Smoking status: Never Smoker  . Smokeless tobacco: Never Used  . Alcohol use 0.6 oz/week    1 Standard drinks or equivalent per week     Comment: 3 drinks per month  . Drug use: No  . Sexual activity: Yes    Partners: Male    Birth control/ protection: None   Other Topics Concern  . Not on file   Social History Narrative  . No  narrative on file    ROS:  Constitutional: Denies fever, malaise, fatigue, headache or abrupt weight changes.  HEENT: Denies eye pain, eye redness, ear pain, ringing in the ears, wax buildup, runny nose, nasal congestion, bloody nose, or sore throat. Respiratory: Denies difficulty breathing, shortness of breath, cough or sputum production.   Cardiovascular: Denies chest pain, chest tightness, palpitations or swelling in the hands or feet.  Gastrointestinal: Pt reports lower abdominal pain (laprascoic surgery 1 week ago) Denies bloating, constipation, diarrhea or blood in the stool.  GU: Denies frequency, urgency, pain with urination, blood in urine, odor or discharge. Musculoskeletal: Denies decrease in range of motion, difficulty with gait, muscle pain or joint pain and swelling.  Skin: Denies redness, rashes, lesions or ulcercations.  Neurological: Denies dizziness, difficulty with memory, difficulty with speech or problems with balance and coordination.  Psych: Denies anxiety, depression, SI/HI.  No other specific complaints in a complete review of systems (except as listed in HPI above).  PE:  BP 120/74   Pulse 84   Temp 98 F (36.7 C) (Oral)   Ht 5\' 6"  (1.676 m)   Wt 186 lb 12 oz (84.7 kg)   LMP 08/09/2016   SpO2 98%   BMI 30.14 kg/m  Wt Readings from Last 3 Encounters:  08/23/16 186 lb 12 oz (84.7 kg)  08/05/16 186 lb 8 oz (84.6 kg)  07/20/16 186 lb (84.4 kg)    General: Appears her stated age, well developed, well nourished in NAD. HEENT: Head: normal shape and size; Eyes: sclera white, no icterus, conjunctiva pink, PERRLA and EOMs intact; Ears: Tm's gray and intact, normal light reflex; Throat/Mouth: Teeth present, mucosa pink and moist, no lesions or ulcerations noted.  Neck: Neck supple, trachea midline. No masses, lumps or thyromegaly present.  Cardiovascular: Normal rate and rhythm. S1,S2 noted.  No murmur, rubs or gallops noted. No JVD or BLE edema.   Pulmonary/Chest: Normal effort and positive vesicular breath sounds. No respiratory distress. No wheezes, rales or ronchi noted.  Abdomen: Soft and mildly tender in the RLQ. Normal bowel sounds. No distention or masses noted. Liver, spleen and kidneys non palpable. Musculoskeletal: Normal range of motion. Strength 5/5 BUE/BLE. No signs of joint swelling. No difficulty with gait.  Neurological: Alert and oriented. Cranial nerves II-XII grossly intact. Coordination normal.  Psychiatric: Mood and affect normal. Behavior is normal. Judgment and thought content normal.     BMET    Component Value Date/Time   NA 132 (L) 01/14/2016 1551   K 3.5 01/14/2016 1551   CL 102 01/14/2016 1551   CO2 25 01/14/2016 1551   GLUCOSE 95 01/14/2016 1551   BUN 12 01/14/2016 1551   CREATININE 0.72 01/14/2016 1551   CREATININE 0.79 07/31/2015 1510   CALCIUM 8.7 (L) 01/14/2016 1551   GFRNONAA >60 01/14/2016 1551   GFRAA >60 01/14/2016 1551    Lipid Panel     Component Value Date/Time   CHOL 153 07/31/2015 1510   TRIG 153 (H) 07/31/2015 1510   HDL 37 (L) 07/31/2015 1510   CHOLHDL 4.1 07/31/2015 1510   VLDL 31 (H) 07/31/2015 1510   LDLCALC 85 07/31/2015 1510    CBC    Component Value Date/Time   WBC 11.0 (H) 08/05/2016 1533   RBC 4.66 08/05/2016 1533   HGB 13.9 08/05/2016 1533   HCT 40.2 08/05/2016 1533   PLT 263 08/05/2016 1533   MCV 86.3 08/05/2016 1533   MCH 29.8 08/05/2016 1533   MCHC 34.6 08/05/2016 1533   RDW 12.8 08/05/2016 1533    Hgb A1C No results found for: HGBA1C   Assessment and Plan:  Preventative Health Maintenance:  She declines flu shot today Tetanus UTD Pap smear UTD Encouraged her to consume a balanced diet and exercise regimen Advised her to see a dentist annually Will check CBC, CMET, Lipid and A1C today  RTC in 1 year, sooner if needed Nicki ReaperBAITY, REGINA, NP

## 2016-08-24 ENCOUNTER — Telehealth: Payer: Self-pay

## 2016-08-24 NOTE — Telephone Encounter (Signed)
Spoke with patient. Advised of results as seen below from Dr.Miller. Patient is agreeable and verbalizes understanding.   Routing to provider for final review. Patient agreeable to disposition. Will close encounter.  

## 2016-08-24 NOTE — Telephone Encounter (Signed)
-----   Message from Jerene BearsMary S Miller, MD sent at 08/23/2016  1:07 PM EST ----- Please let pt know her pathology of the fallopian tube was normal.  No abnormal cells present and I didn't expect any.  Thanks.

## 2016-08-31 ENCOUNTER — Ambulatory Visit (INDEPENDENT_AMBULATORY_CARE_PROVIDER_SITE_OTHER): Payer: Managed Care, Other (non HMO) | Admitting: Obstetrics & Gynecology

## 2016-08-31 ENCOUNTER — Encounter: Payer: Self-pay | Admitting: Obstetrics & Gynecology

## 2016-08-31 VITALS — BP 118/76 | HR 88 | Resp 12 | Ht 66.0 in | Wt 187.8 lb

## 2016-08-31 DIAGNOSIS — Z9889 Other specified postprocedural states: Secondary | ICD-10-CM

## 2016-08-31 NOTE — Progress Notes (Signed)
Post Operative Visit  Procedure: LAPAROSCOPY DIAGNOSTIC with left paratubal cystectomy, CHROMOPERTUBATION, RIGHT SALPINGECTOMY, LAPAROSCOPIC LYSIS OF ADHESIONS     Days Post-op:  14 days   Subjective: Doing well.  Denies pain.  No vaginal bleeding.  Reviewed findings and pathology report from surgery.  Planning Femara use if OPK do not show ovulation the next two months.  Would like her to heal a little more before actively trying for pregnancy.  Pt very comfortable with plan.  Objective: BP 118/76 (BP Location: Right Arm, Patient Position: Sitting, Cuff Size: Normal)   Pulse 88   Resp 12   Ht 5\' 6"  (1.676 m)   Wt 187 lb 12.8 oz (85.2 kg)   LMP 08/09/2016   BMI 30.31 kg/m   EXAM General: alert and cooperative Resp: clear to auscultation bilaterally Cardio: regular rate and rhythm, S1, S2 normal, no murmur, click, rub or gallop GI: soft, non-tender; bowel sounds normal; no masses,  no organomegaly and incision: clean, dry and intact Extremities: extremities normal, atraumatic, no cyanosis or edema Vaginal Bleeding: none  Gyn:  NAEFG, vagina without lesions, cervix close, no CMT, uterus non tender  Assessment: s/p laparoscopy with excision of left paratubal cysts, removal of staples, and right salpingectomy  Plan: Pt will call in January and will start Femara 7.5mg  days 5-9.  She will do OPKs the next two months until her January cycle.  Would plan day 24 progesterone level the month she starts Femara.

## 2016-10-06 ENCOUNTER — Telehealth: Payer: Self-pay | Admitting: Obstetrics & Gynecology

## 2016-10-06 MED ORDER — LETROZOLE 2.5 MG PO TABS: 7.5000 mg | ORAL_TABLET | Freq: Every day | ORAL | 0 refills | Status: DC

## 2016-10-06 NOTE — Telephone Encounter (Signed)
Spoke with patient. Patient states she is going out of town when cycle is due to start and would like to go ahead and pick up Femara previously discussed with Dr. Hyacinth MeekerMiller on 11/21. Advised patient to call office on first day of cycle so that we can schedule lab appointment. Advised patient as seen below, will start Femara 7.5mg  on day 5 of cycle. Patient verbalizes understanding and is agreeable.   08/31/16 OV Plan: Pt will call in January and will start Femara 7.5mg  days 5-9.  She will do OPKs the next two months until her January cycle.  Would plan day 24 progesterone level the month she starts Femara.  Dr. Edward JollySilva, any additional recommendations? OK to close encounter? Cc: Dr. Hyacinth MeekerMiller

## 2016-10-06 NOTE — Telephone Encounter (Signed)
Patient states that she is to get a fertility medicine called to her pharmacy next week but is going out of town and wants to know if it can be called in this week so she can start it next week

## 2016-10-06 NOTE — Telephone Encounter (Signed)
Spoke with Kathy Aguilar at [facility] to update admin instructions for Femara prescrition, was advised prescription was already picked up by patient.  Call to patient advised patient as seen below per Dr. Edward JollySilva - patient read back " Femara- take cycle day 5-9". Patient verbalizes understanding and is agreeable.   Dr. Edward JollySilva, ok to close encounter?  Cc: Dr. Hyacinth MeekerMiller

## 2016-10-06 NOTE — Telephone Encounter (Signed)
Prescription needs to say for the patient to take cycle day 5 - 9 please.

## 2016-10-06 NOTE — Telephone Encounter (Signed)
Encounter closed by me. 

## 2016-10-11 NOTE — L&D Delivery Note (Signed)
Delivery Note Pushed well to crowning. At 8:14 AM a viable and healthy female was delivered via Vaginal, Spontaneous (Presentation:ROA  ).  APGAR: 8, 9; weight 8lb Head emerged slowly so I made preparations for dystocia.  Extra staff and Neo team present Attempted ant shoulder with slight movement.  Mc[place]ts employed Anterior shoulder moved but did not deliver.  Attempted posterior shoulder with some success so attention was turned back to anterior shoulder which then delivered .  Moderate meconium noted.  Baby handed to Neo team  Placenta status: Pitocin and CCT started early.  After 45 min and two surges of blood, placenta was only partially separated. Epidural restarted and Fentanyl given. Dr Alysia PennaErvin consulted.  I attempted manual removal with out success.   Dr Alysia PennaErvin succeeded with  Manual removal with banjo currettage .  Cord:  with the following complications: Retained placenta x 45min  Anesthesia:  Epidural Episiotomy: None Lacerations: None Suture Repair: none Est. Blood Loss (mL): 300  Mom to postpartum.  Baby to Couplet care / Skin to Skin.  Kathy BourgeoisMarie Jamile Aguilar 10/01/2017, 9:53 AM

## 2016-10-13 ENCOUNTER — Encounter: Payer: Self-pay | Admitting: Certified Nurse Midwife

## 2016-10-13 ENCOUNTER — Ambulatory Visit (INDEPENDENT_AMBULATORY_CARE_PROVIDER_SITE_OTHER): Payer: Managed Care, Other (non HMO) | Admitting: Certified Nurse Midwife

## 2016-10-13 VITALS — BP 120/78 | HR 96 | Resp 22 | Ht 66.0 in | Wt 188.2 lb

## 2016-10-13 DIAGNOSIS — Z3201 Encounter for pregnancy test, result positive: Secondary | ICD-10-CM | POA: Diagnosis not present

## 2016-10-13 DIAGNOSIS — N912 Amenorrhea, unspecified: Secondary | ICD-10-CM

## 2016-10-13 DIAGNOSIS — Z7251 High risk heterosexual behavior: Secondary | ICD-10-CM

## 2016-10-13 LAB — POCT URINE PREGNANCY: Preg Test, Ur: POSITIVE — AB

## 2016-10-13 NOTE — Progress Notes (Signed)
Patient ID: Kathy HeraldHeather M Aguilar, female   DOB: 1986/04/10, 31 y.o.   MRN: [id number]  Patient here to confirm pregnancy.

## 2016-10-13 NOTE — Patient Instructions (Signed)

## 2016-10-13 NOTE — Progress Notes (Signed)
31 y.o.married female g2p1001 presents with amenorrhea with + UPT on 10/12/16.Marland Kitchen. LMP 09/10/16. Planned  Pregnancy. Had been trying for over a year. Recent surgery for salpingectomy (right) due to infertility. Had not started Femara. Complaining of breast tenderness only. Slight fatigue,no nausea. Denies spotting, bleeding or cramping. Medications she is taking are: Prenatal vitamins only..Patient has not consumed alcohol since + UPT.Spouse here with patient and very supportive.Both very excited! Her second child, his first.   O: HPI pertinent to above. Healthy [facility] female Affect: normal, orientation x 3   Pap smear: negative            Rubella screen: positive immunity Blood Type: A negative  A: Amenorrhea with positive UPT  4 wk 3 days per LNMP with EDC 06-17-2017. Has 30 day cycles usually Planned pregnancy   P: Reviewed with patient importance of prenatal care during pregnancy. Given OB provider list. Reviewed nutrition importance of pregnancy and selecting from all food groups and making sure to have adequate protein intake daily. Discussed avoiding raw or exotic fish, soft cheeses due to risk of bacteria . Discussed concerns with FAS with alcohol use in pregnancy. Discussed increase of IUGR and SIDS with smoking use or second smoke. Reviewed warning signs of early pregnancy and need to advise if occurs. [facility]. Patient interested in both options. Discussed comfort measures for early pregnancy changes. Offered viability PUS here prior to initiating prenatal care. Patient  plans to have PUS. She will be called with insurance information and scheduled. Questions addressed at length.  Labs: none  Rv prn   35 minutes in time spent with patient in face to face counseling regarding pregnancy and prenatal care options.

## 2016-10-14 NOTE — Progress Notes (Signed)
Reviewed personally.  M. Suzanne Savon Cobbs, MD.  

## 2016-10-15 NOTE — Progress Notes (Signed)
Encounter reviewed Jill Jertson, MD   

## 2016-10-18 ENCOUNTER — Telehealth: Payer: Self-pay | Admitting: Certified Nurse Midwife

## 2016-10-18 NOTE — Telephone Encounter (Signed)
Routing to insurance and benefits to review. Plan per 10/13/16 OV with Leota Sauerseborah Leonard, CNM "Patient  plans to have PUS. She will be called with insurance information and scheduled."   Cc: Leota Sauerseborah Leonard, CNM

## 2016-10-18 NOTE — Telephone Encounter (Signed)
Patient checking on the status of her ultrasound appointment

## 2016-10-20 NOTE — Telephone Encounter (Signed)
Patient is scheduled for PUS 10/28/16 at 1:30pm with 2pm OV to follow with Dr. Hyacinth MeekerMiller.   Routing to provider for final review. Patient is agreeable to disposition. Will close encounter.

## 2016-10-21 ENCOUNTER — Telehealth: Payer: Self-pay | Admitting: Certified Nurse Midwife

## 2016-10-21 NOTE — Telephone Encounter (Signed)
Spoke with patient in regards to recommended ultrasound. Patient understood information presented. Patient has declined scheduling at this time. I advised patient I will forward this information to our nurse supervisor.  Routing to nurse supervisor for review.

## 2016-10-26 ENCOUNTER — Encounter: Payer: Self-pay | Admitting: Obstetrics & Gynecology

## 2016-10-26 NOTE — Telephone Encounter (Signed)
Reviewed call earlier today. Per chart, no bleeding or cramping at last office visit. Patient given option to proceed with ultrasound here versus proceed with OB care. For patient, it is financially beneficial to proceed with OB care.   Routing to provider for final review. Patient agreeable to disposition. Will close encounter.

## 2016-10-26 NOTE — Telephone Encounter (Signed)
Spoke with patient in regards to OB viability ultrasound. Patient has decided to establish care with an obstetrician and [facility].  Routing to [employer] Chemical[employer]ally Yeakley

## 2016-10-28 ENCOUNTER — Other Ambulatory Visit: Payer: Managed Care, Other (non HMO) | Admitting: Obstetrics & Gynecology

## 2016-10-28 ENCOUNTER — Other Ambulatory Visit: Payer: Managed Care, Other (non HMO)

## 2016-10-31 ENCOUNTER — Inpatient Hospital Stay (HOSPITAL_COMMUNITY)
Admission: AD | Admit: 2016-10-31 | Discharge: 2016-10-31 | Disposition: A | Discharge status: Home / Self Care | Payer: Managed Care, Other (non HMO) | Source: Ambulatory Visit | Attending: Obstetrics & Gynecology | Admitting: Obstetrics & Gynecology

## 2016-10-31 ENCOUNTER — Encounter: Payer: Self-pay | Admitting: Student

## 2016-10-31 ENCOUNTER — Other Ambulatory Visit: Payer: Self-pay | Admitting: Obstetrics & Gynecology

## 2016-10-31 ENCOUNTER — Inpatient Hospital Stay (HOSPITAL_COMMUNITY): Payer: Managed Care, Other (non HMO)

## 2016-10-31 DIAGNOSIS — O200 Threatened abortion: Secondary | ICD-10-CM

## 2016-10-31 DIAGNOSIS — Z3A01 Less than 8 weeks gestation of pregnancy: Secondary | ICD-10-CM | POA: Diagnosis not present

## 2016-10-31 DIAGNOSIS — O209 Hemorrhage in early pregnancy, unspecified: Secondary | ICD-10-CM | POA: Diagnosis present

## 2016-10-31 LAB — URINALYSIS, ROUTINE W REFLEX MICROSCOPIC
Bilirubin Urine: NEGATIVE
Glucose, UA: NEGATIVE mg/dL
Ketones, ur: NEGATIVE mg/dL
Leukocytes, UA: NEGATIVE
Nitrite: NEGATIVE
Protein, ur: NEGATIVE mg/dL
SPECIFIC GRAVITY, URINE: 1.019 (ref 1.005–1.030)
pH: 5 (ref 5.0–8.0)

## 2016-10-31 LAB — CBC
HEMATOCRIT: 39.7 % (ref 36.0–46.0)
HEMOGLOBIN: 13.9 g/dL (ref 12.0–15.0)
MCH: 30 pg (ref 26.0–34.0)
MCHC: 35 g/dL (ref 30.0–36.0)
MCV: 85.7 fL (ref 78.0–100.0)
Platelets: 264 10*3/uL (ref 150–400)
RBC: 4.63 MIL/uL (ref 3.87–5.11)
RDW: 13 % (ref 11.5–15.5)
WBC: 9.1 10*3/uL (ref 4.0–10.5)

## 2016-10-31 LAB — WET PREP, GENITAL
CLUE CELLS WET PREP: NONE SEEN
Sperm: NONE SEEN
Trich, Wet Prep: NONE SEEN
YEAST WET PREP: NONE SEEN

## 2016-10-31 LAB — HCG, QUANTITATIVE, PREGNANCY: HCG, BETA CHAIN, QUANT, S: 954 m[IU]/mL — AB (ref <5)

## 2016-10-31 LAB — POCT PREGNANCY, URINE: PREG TEST UR: POSITIVE — AB

## 2016-10-31 LAB — ABO/RH: ABO/RH(D): A NEG

## 2016-10-31 MED ORDER — RHO D IMMUNE GLOBULIN 1500 UNIT/2ML IJ SOSY
300.0000 ug | PREFILLED_SYRINGE | Freq: Once | INTRAMUSCULAR | Status: AC
  Administered 2016-10-31: 300 ug via INTRAMUSCULAR
  Filled 2016-10-31: qty 2

## 2016-10-31 NOTE — MAU Provider Note (Signed)
History     CSN: [id number]  Arrival date and time: 10/31/16 1137   First Provider Initiated Contact with Patient 10/31/16 1207      Chief Complaint  Patient presents with  . Vaginal Bleeding   HPI Kathy Aguilar is a 31 y.o. G2P1001 at 2667w2d by LMP who presents with vaginal bleeding. Reports pink spotting on toilet paper last night. Spotting has continued this morning but it now brown in color. Is not bleeding into a pad or passing clots. Denies abdominal pain, vaginal discharge, n/v, dysuria, or recent intercourse.   OB History    Gravida Para Term Preterm AB Living   2 1 1     1    SAB TAB Ectopic Multiple Live Births           1      Past Medical History:  Diagnosis Date  . Appendicitis   . Family history of adverse reaction to anesthesia    mother has vomiting after anesthesia    Past Surgical History:  Procedure Laterality Date  . BILATERAL SALPINGECTOMY Right 08/17/2016   Procedure: RIGHT SALPINGECTOMY;  Surgeon: Jerene BearsMary S Miller, MD;  Location: [facility];  Service: Gynecology;  Laterality: Right;  . CHROMOPERTUBATION Bilateral 08/17/2016   Procedure: CHROMOPERTUBATION;  Surgeon: Jerene BearsMary S Miller, MD;  Location: [facility];  Service: Gynecology;  Laterality: Bilateral;  . LAPAROSCOPIC APPENDECTOMY N/A 01/14/2016   Procedure: APPENDECTOMY LAPAROSCOPIC;  Surgeon: Lattie Hawichard E Cooper, MD;  Location: [facility];  Service: General;  Laterality: N/A;  . LAPAROSCOPIC LYSIS OF ADHESIONS  08/17/2016   Procedure: LAPAROSCOPIC LYSIS OF ADHESIONS;  Surgeon: Jerene BearsMary S Miller, MD;  Location: [facility];  Service: Gynecology;;  . LAPAROSCOPY N/A 08/17/2016   Procedure: LAPAROSCOPY DIAGNOSTIC with left paratubal cystectomy;  Surgeon: Jerene BearsMary S Miller, MD;  Location: [facility];  Service: Gynecology;  Laterality: N/A;    Family History  Problem Relation Age of Onset  . Breast cancer Mother 6748  . Osteoarthritis Maternal Grandmother   . Hypertension Maternal Grandmother   . Cancer Maternal Grandmother     blood  cancer?  . Kidney disease Maternal Grandmother   . Cancer Maternal Grandfather 6270    prostate cancer  . Kidney disease Paternal Grandmother     Social History  Substance Use Topics  . Smoking status: Never Smoker  . Smokeless tobacco: Never Used  . Alcohol use 0.6 oz/week    1 Standard drinks or equivalent per week     Comment: 3 drinks per month    Allergies: No Known Allergies  Prescriptions Prior to Admission  Medication Sig Dispense Refill Last Dose  . Prenatal Vit-Fe Fumarate-FA (PRENATAL VITAMIN PO) Take 1 tablet by mouth every morning.    10/31/2016 at Unknown time    Review of Systems  Constitutional: Negative.   Gastrointestinal: Negative.   Genitourinary: Positive for vaginal bleeding. Negative for dysuria and vaginal discharge.   Physical Exam   Blood pressure 142/83, pulse 105, temperature 98.1 F (36.7 C), temperature source Oral, resp. rate 18, weight 188 lb (85.3 kg), last menstrual period 09/10/2016, SpO2 98 %.  Physical Exam  Nursing note and vitals reviewed. Constitutional: She is oriented to person, place, and time. She appears well-developed and well-nourished. No distress.  HENT:  Head: Normocephalic and atraumatic.  Eyes: Conjunctivae are normal. Right eye exhibits no discharge. Left eye exhibits no discharge. No scleral icterus.  Neck: Normal range of motion.  Cardiovascular: Normal rate, regular rhythm and normal heart sounds.   No  murmur heard. Respiratory: Effort normal and breath sounds normal. No respiratory distress. She has no wheezes.  GI: Soft. Bowel sounds are normal. She exhibits no distension. There is no tenderness. There is no rebound.  Genitourinary: Uterus normal. Cervix exhibits no motion tenderness and no friability. There is bleeding (small amount of brown blood in canal; no active bleeding) in the vagina.  Genitourinary Comments: Cervix closed  Neurological: She is alert and oriented to person, place, and time.  Skin: Skin is  warm and dry. She is not diaphoretic.  Psychiatric: She has a normal mood and affect. Her behavior is normal. Judgment and thought content normal.    MAU Course  Procedures Results for orders placed or performed during the hospital encounter of 10/31/16 (from the past 24 hour(s))  Urinalysis, Routine w reflex microscopic     Status: Abnormal   Collection Time: 10/31/16 11:43 AM  Result Value Ref Range   Color, Urine YELLOW YELLOW   APPearance CLEAR CLEAR   Specific Gravity, Urine 1.019 1.005 - 1.030   pH 5.0 5.0 - 8.0   Glucose, UA NEGATIVE NEGATIVE mg/dL   Hgb urine dipstick MODERATE (A) NEGATIVE   Bilirubin Urine NEGATIVE NEGATIVE   Ketones, ur NEGATIVE NEGATIVE mg/dL   Protein, ur NEGATIVE NEGATIVE mg/dL   Nitrite NEGATIVE NEGATIVE   Leukocytes, UA NEGATIVE NEGATIVE   RBC / HPF 0-5 0 - 5 RBC/hpf   WBC, UA 0-5 0 - 5 WBC/hpf   Bacteria, UA RARE (A) NONE SEEN   Squamous Epithelial / LPF 0-5 (A) NONE SEEN   Mucous PRESENT   Pregnancy, urine POC     Status: Abnormal   Collection Time: 10/31/16 11:52 AM  Result Value Ref Range   Preg Test, Ur POSITIVE (A) NEGATIVE  Wet prep, genital     Status: Abnormal   Collection Time: 10/31/16 12:14 PM  Result Value Ref Range   Yeast Wet Prep HPF POC NONE SEEN NONE SEEN   Trich, Wet Prep NONE SEEN NONE SEEN   Clue Cells Wet Prep HPF POC NONE SEEN NONE SEEN   WBC, Wet Prep HPF POC MODERATE (A) NONE SEEN   Sperm NONE SEEN   CBC     Status: None   Collection Time: 10/31/16 12:21 PM  Result Value Ref Range   WBC 9.1 4.0 - 10.5 K/uL   RBC 4.63 3.87 - 5.11 MIL/uL   Hemoglobin 13.9 12.0 - 15.0 g/dL   HCT 16.1 09.6 - 04.5 %   MCV 85.7 78.0 - 100.0 fL   MCH 30.0 26.0 - 34.0 pg   MCHC 35.0 30.0 - 36.0 g/dL   RDW 40.9 81.1 - 91.4 %   Platelets 264 150 - 400 K/uL  ABO/Rh     Status: None (Preliminary result)   Collection Time: 10/31/16 12:21 PM  Result Value Ref Range   ABO/RH(D) A NEG   hCG, quantitative, pregnancy     Status: Abnormal    Collection Time: 10/31/16 12:21 PM  Result Value Ref Range   hCG, Beta Chain, Quant, S 954 (H) <5 mIU/mL    MDM +UPT UA, wet prep, GC/chlamydia, CBC, ABO/Rh, quant hCG, HIV, and [place] today to rule out ectopic pregnancy A negative -- rhogham ordered  Care turned over to [facility] CNM    Judeth Horn, NP 10/31/2016 1300   Assessment and Plan  A:  Pregnancy at [redacted]w[redacted]d by LMP       Single gestational sac of [redacted]w[redacted]d with no yolk sac or  empbryo       Spotting first trimester       Rh Negative  P:   Will give Rhig prior to discharge        Consulted Dr Hyacinth Meeker with presentation, examfindings and lab/US results        Plan is for her to return to office Tuesday for Repeat HCG        Office will call her

## 2016-10-31 NOTE — MAU Note (Signed)
Pt states she started having some spotting last night and it is still happening today.  Pt states that it was bright pink yesterday and brown today.  Pt states it is only when she wipes and it does not collect on her underwear.  Pt denies any abnormal pain.

## 2016-10-31 NOTE — Discharge Instructions (Signed)
Vaginal Bleeding During Pregnancy, First Trimester °A small amount of bleeding (spotting) from the vagina is common in early pregnancy. Sometimes the bleeding is normal and is not a problem, and sometimes it is a sign of something serious. Be sure to tell your doctor about any bleeding from your vagina right away. °Follow these instructions at home: °· Watch your condition for any changes. °· Follow your doctor's instructions about how active you can be. °· If you are on bed rest: °¨ You may need to stay in bed and only get up to use the bathroom. °¨ You may be allowed to do some activities. °¨ If you need help, make plans for someone to help you. °· Write down: °¨ The number of pads you use each day. °¨ How often you change pads. °¨ How soaked (saturated) your pads are. °· Do not use tampons. °· Do not douche. °· Do not have sex or orgasms until your doctor says it is okay. °· If you pass any tissue from your vagina, save the tissue so you can show it to your doctor. °· Only take medicines as told by your doctor. °· Do not take aspirin because it can make you bleed. °· Keep all follow-up visits as told by your doctor. °Contact a doctor if: °· You bleed from your vagina. °· You have cramps. °· You have labor pains. °· You have a fever that does not go away after you take medicine. °Get help right away if: °· You have very bad cramps in your back or belly (abdomen). °· You pass large clots or tissue from your vagina. °· You bleed more. °· You feel light-headed or weak. °· You pass out (faint). °· You have chills. °· You are leaking fluid or have a gush of fluid from your vagina. °· You pass out while pooping (having a bowel movement). °This information is not intended to replace advice given to you by your health care provider. Make sure you discuss any questions you have with your health care provider. °Document Released: 02/11/2014 Document Revised: 03/04/2016 Document Reviewed: 06/04/2013 °Elsevier Interactive  Patient Education © 2017 Elsevier Inc. ° °

## 2016-11-01 ENCOUNTER — Telehealth: Payer: Self-pay

## 2016-11-01 LAB — RH IG WORKUP (INCLUDES ABO/RH)
ABO/RH(D): A NEG
ANTIBODY SCREEN: NEGATIVE
Gestational Age(Wks): 7
UNIT DIVISION: 0

## 2016-11-01 LAB — GC/CHLAMYDIA PROBE AMP (~~LOC~~) NOT AT ARMC
Chlamydia: NEGATIVE
Neisseria Gonorrhea: NEGATIVE

## 2016-11-01 NOTE — Telephone Encounter (Addendum)
Spoke with patient. Patient scheduled for STAT HCG on 11/02/2016 at 8:55 am. Patient is agreeable to date and time.  ----- Message from Jerene BearsMary S Miller, MD sent at 10/31/2016  1:40 PM EST ----- Regarding: [facility].  She needs a repeat HCG in the office on tues morning.  I've put in the order and for a stat lab.  Can you please call her and get this scheduled?  Routing to provider for final review. Patient agreeable to disposition. Will close encounter.

## 2016-11-02 ENCOUNTER — Other Ambulatory Visit: Payer: Self-pay | Admitting: Obstetrics & Gynecology

## 2016-11-02 ENCOUNTER — Other Ambulatory Visit (INDEPENDENT_AMBULATORY_CARE_PROVIDER_SITE_OTHER): Payer: Managed Care, Other (non HMO)

## 2016-11-02 ENCOUNTER — Telehealth: Payer: Self-pay | Admitting: *Deleted

## 2016-11-02 ENCOUNTER — Telehealth: Payer: Self-pay | Admitting: Obstetrics & Gynecology

## 2016-11-02 DIAGNOSIS — O200 Threatened abortion: Secondary | ICD-10-CM

## 2016-11-02 DIAGNOSIS — O039 Complete or unspecified spontaneous abortion without complication: Secondary | ICD-10-CM

## 2016-11-02 LAB — HCG, QUANTITATIVE, PREGNANCY: hCG, Beta Chain, Quant, S: 295.9 m[IU]/mL — ABNORMAL HIGH

## 2016-11-02 NOTE — Telephone Encounter (Signed)
Patient was seen today for stat labs and is wondering if the results are back.

## 2016-11-02 NOTE — Telephone Encounter (Signed)
[facility] [place] [facility] for stat HCG lab. Stat HCG level was 295.9. Dr. Hyacinth MeekerMiller notified.

## 2016-11-02 NOTE — Telephone Encounter (Signed)
Called pt personally.  Advised of lowering HCG.  She is bleeding consistent with miscarriage.  She will return for HCG next week.  Kennon RoundsSally will schedule appt.  Ok to close encounter.

## 2016-11-02 NOTE — Telephone Encounter (Signed)
Dr. Miller -see patient message below and advise. 

## 2016-11-10 ENCOUNTER — Other Ambulatory Visit (INDEPENDENT_AMBULATORY_CARE_PROVIDER_SITE_OTHER): Payer: Managed Care, Other (non HMO)

## 2016-11-10 DIAGNOSIS — O039 Complete or unspecified spontaneous abortion without complication: Secondary | ICD-10-CM

## 2016-11-11 LAB — HCG, QUANTITATIVE, PREGNANCY: HCG, BETA CHAIN, QUANT, S: 8 m[IU]/mL — AB

## 2016-11-15 ENCOUNTER — Telehealth: Payer: Self-pay

## 2016-11-15 NOTE — Telephone Encounter (Signed)
Spoke with patient. Results given as seen below. Patient is agreeable. Lab appointment scheduled for 11/17/2016 at 8:30 am. Patient is agreeable to date and time.  Routing to provider for final review. Patient agreeable to disposition. Will close encounter.

## 2016-11-15 NOTE — Telephone Encounter (Signed)
Patient is returning a call to [place]. She states it is ok to leave a detailed message with results.

## 2016-11-15 NOTE — Telephone Encounter (Signed)
-----   Message from Jerene BearsMary S Miller, MD sent at 11/13/2016 11:25 PM EST ----- Please let pt know level has decreased to 8.  Should repeat again this week on Wednesday.  This will likely be the last one.  Order placed.

## 2016-11-15 NOTE — Telephone Encounter (Signed)
Left message to call Hanad Leino at [phone]. 

## 2016-11-15 NOTE — Telephone Encounter (Signed)
Left detailed message at number provided [phone], okay per ROI. Advised of results as seen below from Dr.Miller. Advised she will need to contact the office to schedule a lab recheck on Wednesday of this week.

## 2016-11-17 ENCOUNTER — Other Ambulatory Visit (INDEPENDENT_AMBULATORY_CARE_PROVIDER_SITE_OTHER): Payer: Managed Care, Other (non HMO)

## 2016-11-17 DIAGNOSIS — O039 Complete or unspecified spontaneous abortion without complication: Secondary | ICD-10-CM

## 2016-11-18 LAB — HCG, SERUM, QUALITATIVE: PREG SERUM: NEGATIVE

## 2017-02-02 ENCOUNTER — Telehealth: Payer: Self-pay | Admitting: Obstetrics & Gynecology

## 2017-02-02 NOTE — Telephone Encounter (Signed)
Patient sent this  message through MyChart requesting an appointment:  Good afternoon. I would like to schedule an appointment with Dr. Hyacinth Meeker when she is available. The appointment will be in reference to a pregnancy. Took a HPT last night and it was positive. I would like to request a blood test due to having a miscarriage of my last pregnancy 3 months ago.   Patient is aware that a triage nurse will be contacting her soon.

## 2017-02-02 NOTE — Telephone Encounter (Signed)
Spoke with patient. Patient reports LMP 12/31/16, UPT positive on 02/01/17. Patient would like to schedule OV for pregnancy conformation and request late afternoon appointment. Patient scheduled for 4/26 at 4pm with Dr. Hyacinth Meeker. Patient is agreeable.   Routing to provider for final review. Patient is agreeable to disposition. Will close encounter.

## 2017-02-03 ENCOUNTER — Encounter: Payer: Self-pay | Admitting: Obstetrics & Gynecology

## 2017-02-03 ENCOUNTER — Ambulatory Visit (INDEPENDENT_AMBULATORY_CARE_PROVIDER_SITE_OTHER): Payer: 59 | Admitting: Obstetrics & Gynecology

## 2017-02-03 VITALS — BP 130/70 | HR 100 | Resp 16 | Wt 190.0 lb

## 2017-02-03 DIAGNOSIS — N912 Amenorrhea, unspecified: Secondary | ICD-10-CM

## 2017-02-03 DIAGNOSIS — Z3200 Encounter for pregnancy test, result unknown: Secondary | ICD-10-CM

## 2017-02-03 LAB — POCT URINE PREGNANCY: Preg Test, Ur: POSITIVE — AB

## 2017-02-03 NOTE — Progress Notes (Signed)
GYNECOLOGY  VISIT   HPI: 31 y.o. G59P1011 Married Caucasian female here for confirmation of pregnancy.  She is having nausea and breast tenderness.  She is also having heartburn.  She hasn't taken anything for this.  Had a miscarriage in January so is a little anxious about this pregnancy.  Has upcoming trip to [place] planned.  Feels timing isn't great but still excited.  Reports she and spouse only had intercourse once in last few weeks because he has foot injury and she was frustrated with him about this.  Laughs about this today.  Blood type is A negative.  Knows will need Rhogam with any bleeding.  GYNECOLOGIC HISTORY: Patient's last menstrual period was 12/31/2016. Contraception: none Menopausal hormone therapy: none  Patient Active Problem List   Diagnosis Date Noted  . Infertility, female, secondary 07/20/2016  . History of appendectomy 07/20/2016    Past Medical History:  Diagnosis Date  . Appendicitis   . Family history of adverse reaction to anesthesia    mother has vomiting after anesthesia    Past Surgical History:  Procedure Laterality Date  . BILATERAL SALPINGECTOMY Right 08/17/2016   Procedure: RIGHT SALPINGECTOMY;  Surgeon: Jerene Bears, MD;  Location: [facility];  Service: Gynecology;  Laterality: Right;  . CHROMOPERTUBATION Bilateral 08/17/2016   Procedure: CHROMOPERTUBATION;  Surgeon: Jerene Bears, MD;  Location: [facility];  Service: Gynecology;  Laterality: Bilateral;  . LAPAROSCOPIC APPENDECTOMY N/A 01/14/2016   Procedure: APPENDECTOMY LAPAROSCOPIC;  Surgeon: Lattie Haw, MD;  Location: [facility];  Service: General;  Laterality: N/A;  . LAPAROSCOPIC LYSIS OF ADHESIONS  08/17/2016   Procedure: LAPAROSCOPIC LYSIS OF ADHESIONS;  Surgeon: Jerene Bears, MD;  Location: [facility];  Service: Gynecology;;  . LAPAROSCOPY N/A 08/17/2016   Procedure: LAPAROSCOPY DIAGNOSTIC with left paratubal cystectomy;  Surgeon: Jerene Bears, MD;  Location: [facility];  Service: Gynecology;   Laterality: N/A;    MEDS:  Reviewed in EPIC and UTD  ALLERGIES: Patient has no known allergies.  Family History  Problem Relation Age of Onset  . Breast cancer Mother 57  . Osteoarthritis Maternal Grandmother   . Hypertension Maternal Grandmother   . Cancer Maternal Grandmother     blood cancer?  . Kidney disease Maternal Grandmother   . Cancer Maternal Grandfather 48    prostate cancer  . Kidney disease Paternal Grandmother     SH:  Married, non smoker  Review of Systems  All other systems reviewed and are negative.   PHYSICAL EXAMINATION:    BP 130/70 (BP Location: Right Arm, Patient Position: Sitting, Cuff Size: Normal)   Pulse 100   Resp 16   Wt 190 lb (86.2 kg)   LMP 12/31/2016   BMI 30.67 kg/m     General appearance: alert, cooperative and appears stated age CV:  Regular rate and rhythm Lungs:  clear to auscultation, no wheezes, rales or rhonchi, symmetric air entry  Pelvic: not performed today  Assessment: Amenorrhea Early pregnancy H/O miscarriage earlier this year  Plan: HCG will be obtained today.  Viability scan will be planned.  May need to repeat HCG depending on results of test today.  Pt is not having any abnormal early pregnancy symptoms, just nervous due to prior miscarriage.  On PNV.   ~15 minutes spent with patient >50% of time was in face to face discussion of above.

## 2017-02-04 ENCOUNTER — Telehealth: Payer: Self-pay

## 2017-02-04 DIAGNOSIS — Z8759 Personal history of other complications of pregnancy, childbirth and the puerperium: Secondary | ICD-10-CM

## 2017-02-04 LAB — HCG, QUANTITATIVE, PREGNANCY: hCG, Beta Chain, Quant, S: 2946 m[IU]/mL — ABNORMAL HIGH

## 2017-02-04 NOTE — Telephone Encounter (Signed)
-----   Message from Jerene Bears, MD sent at 02/04/2017  7:39 AM EDT ----- Notified pt that her HCG was 2,946.  This is great and much higher than last pregnancy.  It's so good, she doesn't need to repeat it unless she really wants to.  She was planning on repeating it next week and that is fine.  I will need to pt in an order if she desires to have it repeated.  It is fine to repeat in one week (which is what we talked about yesterday.)

## 2017-02-04 NOTE — Telephone Encounter (Signed)
Spoke with patient. Advised of results and message as seen below from Dr.Miller. Patient is agreeable and verbalizes understanding. Would like to proceed with scheduling lab work for next week. Lab appointment scheduled for 02/10/2017 at 8:40 am. Patient is agreeable to date and time.  Lab order pending. Dr.Miller please advise diagnosis.

## 2017-02-04 NOTE — Telephone Encounter (Signed)
Order association placed and signed.  Encounter closed.

## 2017-02-10 ENCOUNTER — Other Ambulatory Visit: Payer: 59

## 2017-02-10 DIAGNOSIS — Z8759 Personal history of other complications of pregnancy, childbirth and the puerperium: Secondary | ICD-10-CM

## 2017-02-11 LAB — HCG, QUANTITATIVE, PREGNANCY: hCG, Beta Chain, Quant, S: 23161.1 m[IU]/mL — ABNORMAL HIGH

## 2017-02-15 ENCOUNTER — Other Ambulatory Visit: Payer: Self-pay | Admitting: *Deleted

## 2017-02-15 DIAGNOSIS — N912 Amenorrhea, unspecified: Secondary | ICD-10-CM

## 2017-02-15 DIAGNOSIS — Z3201 Encounter for pregnancy test, result positive: Secondary | ICD-10-CM

## 2017-02-17 ENCOUNTER — Telehealth: Payer: Self-pay | Admitting: Obstetrics & Gynecology

## 2017-02-17 NOTE — Telephone Encounter (Signed)
Called patient to review benefits for scheduled ultrasound appointment. Left Voicemail requesting a call back. See account notes for additional benefit details

## 2017-02-21 ENCOUNTER — Telehealth: Payer: Self-pay | Admitting: Obstetrics & Gynecology

## 2017-02-21 NOTE — Telephone Encounter (Signed)
Call to patient to cancel and reschedule ultrasound originally scheduled for 02-22-17. Left voicemail to call back and reschedule appointment.

## 2017-02-22 ENCOUNTER — Other Ambulatory Visit: Payer: 59 | Admitting: Obstetrics & Gynecology

## 2017-02-22 ENCOUNTER — Other Ambulatory Visit: Payer: 59

## 2017-02-22 NOTE — Telephone Encounter (Signed)
Call to patient, patient verified she received message about rescheduling 02/22/17 ultrasound appointment. Patient states she will call back to reschedule, once she confirms her spouses availability for the appointment.   Routing to Dr Hyacinth MeekerMiller  cc: Billie RuddySally Yeakley

## 2017-03-14 ENCOUNTER — Inpatient Hospital Stay (HOSPITAL_COMMUNITY)
Admission: AD | Admit: 2017-03-14 | Discharge: 2017-03-15 | Disposition: A | Discharge status: Home / Self Care | Payer: 59 | Source: Ambulatory Visit | Attending: Obstetrics and Gynecology | Admitting: Obstetrics and Gynecology

## 2017-03-14 ENCOUNTER — Encounter (HOSPITAL_COMMUNITY): Payer: Self-pay

## 2017-03-14 DIAGNOSIS — Z803 Family history of malignant neoplasm of breast: Secondary | ICD-10-CM | POA: Insufficient documentation

## 2017-03-14 DIAGNOSIS — Z3A10 10 weeks gestation of pregnancy: Secondary | ICD-10-CM | POA: Insufficient documentation

## 2017-03-14 DIAGNOSIS — Z8249 Family history of ischemic heart disease and other diseases of the circulatory system: Secondary | ICD-10-CM | POA: Diagnosis not present

## 2017-03-14 DIAGNOSIS — O219 Vomiting of pregnancy, unspecified: Secondary | ICD-10-CM | POA: Diagnosis not present

## 2017-03-14 DIAGNOSIS — Z9889 Other specified postprocedural states: Secondary | ICD-10-CM | POA: Insufficient documentation

## 2017-03-14 DIAGNOSIS — R112 Nausea with vomiting, unspecified: Secondary | ICD-10-CM | POA: Insufficient documentation

## 2017-03-14 DIAGNOSIS — Z3491 Encounter for supervision of normal pregnancy, unspecified, first trimester: Secondary | ICD-10-CM

## 2017-03-14 DIAGNOSIS — R51 Headache: Secondary | ICD-10-CM | POA: Diagnosis not present

## 2017-03-14 DIAGNOSIS — O26891 Other specified pregnancy related conditions, first trimester: Secondary | ICD-10-CM | POA: Diagnosis present

## 2017-03-14 LAB — BASIC METABOLIC PANEL
ANION GAP: 9 (ref 5–15)
BUN: 8 mg/dL (ref 6–20)
CALCIUM: 9.2 mg/dL (ref 8.9–10.3)
CO2: 23 mmol/L (ref 22–32)
Chloride: 101 mmol/L (ref 101–111)
Creatinine, Ser: 0.58 mg/dL (ref 0.44–1.00)
Glucose, Bld: 96 mg/dL (ref 65–99)
POTASSIUM: 3.7 mmol/L (ref 3.5–5.1)
Sodium: 133 mmol/L — ABNORMAL LOW (ref 135–145)

## 2017-03-14 LAB — CBC
HEMATOCRIT: 39 % (ref 36.0–46.0)
Hemoglobin: 14 g/dL (ref 12.0–15.0)
MCH: 29.9 pg (ref 26.0–34.0)
MCHC: 35.9 g/dL (ref 30.0–36.0)
MCV: 83.3 fL (ref 78.0–100.0)
PLATELETS: 252 10*3/uL (ref 150–400)
RBC: 4.68 MIL/uL (ref 3.87–5.11)
RDW: 12.6 % (ref 11.5–15.5)
WBC: 13.6 10*3/uL — AB (ref 4.0–10.5)

## 2017-03-14 LAB — URINALYSIS, ROUTINE W REFLEX MICROSCOPIC
BILIRUBIN URINE: NEGATIVE
GLUCOSE, UA: NEGATIVE mg/dL
Hgb urine dipstick: NEGATIVE
Ketones, ur: NEGATIVE mg/dL
LEUKOCYTES UA: NEGATIVE
Nitrite: NEGATIVE
PH: 5 (ref 5.0–8.0)
PROTEIN: NEGATIVE mg/dL
Specific Gravity, Urine: 1.026 (ref 1.005–1.030)

## 2017-03-14 LAB — POCT PREGNANCY, URINE: Preg Test, Ur: POSITIVE — AB

## 2017-03-14 MED ORDER — DIPHENHYDRAMINE HCL 50 MG/ML IJ SOLN
25.0000 mg | Freq: Once | INTRAMUSCULAR | Status: AC
  Administered 2017-03-14: 25 mg via INTRAVENOUS
  Filled 2017-03-14: qty 1

## 2017-03-14 MED ORDER — FAMOTIDINE IN NACL 20-0.9 MG/50ML-% IV SOLN
20.0000 mg | Freq: Once | INTRAVENOUS | Status: AC
  Administered 2017-03-14: 20 mg via INTRAVENOUS
  Filled 2017-03-14: qty 50

## 2017-03-14 MED ORDER — PROMETHAZINE HCL 25 MG/ML IJ SOLN
25.0000 mg | Freq: Once | INTRAMUSCULAR | Status: AC
  Administered 2017-03-14: 25 mg via INTRAVENOUS
  Filled 2017-03-14: qty 1

## 2017-03-14 MED ORDER — DEXAMETHASONE SODIUM PHOSPHATE 10 MG/ML IJ SOLN
10.0000 mg | Freq: Once | INTRAMUSCULAR | Status: AC
  Administered 2017-03-14: 10 mg via INTRAVENOUS
  Filled 2017-03-14: qty 1

## 2017-03-14 MED ORDER — LACTATED RINGERS IV BOLUS (SEPSIS)
1000.0000 mL | Freq: Once | INTRAVENOUS | Status: AC
  Administered 2017-03-14: 1000 mL via INTRAVENOUS

## 2017-03-14 NOTE — MAU Note (Signed)
Pt states she is [redacted] weeks pregnant that she is unable to keep anything down for 2 days. States she has vomited at least 4 times today. States she does not have any nausea medication. Pt denies vaginal bleeding or discharge. Pt states she does have upper abdominal pain after vomiting. LMP: 12/31/2016

## 2017-03-14 NOTE — MAU Provider Note (Signed)
History     CSN: [id number]  Arrival date and time: 03/14/17 2106  First Provider Initiated Contact with Patient 03/14/17 2216      Chief Complaint  Patient presents with  . Emesis During Pregnancy   HPI Kathy Aguilar is a 31 y.o. G3P1011 at 299w3d who presents with nausea & vomiting. Symptoms worsened yesterday. Reports vomiting 4 times today & unable to keep down food. Has not treated symptoms & does not have antiemetic at home. Denies abdominal pain, diarrhea, constipation, vaginal bleeding, or heartburn. Has been seeing Dr. Hyacinth MeekerMiller & is planning on calling an ob/gyn tomorrow to establish prenatal care.   OB History    Gravida Para Term Preterm AB Living   3 1 1  0 1 1   SAB TAB Ectopic Multiple Live Births   1 0 0 0 1      Past Medical History:  Diagnosis Date  . Appendicitis   . Family history of adverse reaction to anesthesia    mother has vomiting after anesthesia    Past Surgical History:  Procedure Laterality Date  . BILATERAL SALPINGECTOMY Right 08/17/2016   Procedure: RIGHT SALPINGECTOMY;  Surgeon: Jerene BearsMary S Miller, MD;  Location: [facility];  Service: Gynecology;  Laterality: Right;  . CHROMOPERTUBATION Bilateral 08/17/2016   Procedure: CHROMOPERTUBATION;  Surgeon: Jerene BearsMary S Miller, MD;  Location: [facility];  Service: Gynecology;  Laterality: Bilateral;  . LAPAROSCOPIC APPENDECTOMY N/A 01/14/2016   Procedure: APPENDECTOMY LAPAROSCOPIC;  Surgeon: Lattie Hawichard E Cooper, MD;  Location: [facility];  Service: General;  Laterality: N/A;  . LAPAROSCOPIC LYSIS OF ADHESIONS  08/17/2016   Procedure: LAPAROSCOPIC LYSIS OF ADHESIONS;  Surgeon: Jerene BearsMary S Miller, MD;  Location: [facility];  Service: Gynecology;;  . LAPAROSCOPY N/A 08/17/2016   Procedure: LAPAROSCOPY DIAGNOSTIC with left paratubal cystectomy;  Surgeon: Jerene BearsMary S Miller, MD;  Location: [facility];  Service: Gynecology;  Laterality: N/A;    Family History  Problem Relation Age of Onset  . Breast cancer Mother 8148  . Osteoarthritis Maternal  Grandmother   . Hypertension Maternal Grandmother   . Cancer Maternal Grandmother        blood cancer?  . Kidney disease Maternal Grandmother   . Cancer Maternal Grandfather 6970       prostate cancer  . Kidney disease Paternal Grandmother     Social History  Substance Use Topics  . Smoking status: Never Smoker  . Smokeless tobacco: Never Used  . Alcohol use No    Allergies: No Known Allergies  Prescriptions Prior to Admission  Medication Sig Dispense Refill Last Dose  . Prenatal Vit-Fe Fumarate-FA (PRENATAL VITAMIN PO) Take 1 tablet by mouth every morning.    Taking    Review of Systems  Constitutional: Negative.   Gastrointestinal: Positive for nausea and vomiting. Negative for abdominal pain, constipation and diarrhea.  Genitourinary: Negative.    Physical Exam   Blood pressure 119/76, pulse 91, temperature 98.6 F (37 C), temperature source Oral, resp. rate 18, height 5\' 7"  (1.702 m), weight 180 lb (81.6 kg), last menstrual period 12/31/2016, SpO2 100 %.  Physical Exam  Nursing note and vitals reviewed. Constitutional: She is oriented to person, place, and time. She appears well-developed and well-nourished. No distress.  HENT:  Head: Normocephalic and atraumatic.  Eyes: Conjunctivae are normal. Right eye exhibits no discharge. Left eye exhibits no discharge. No scleral icterus.  Neck: Normal range of motion.  Cardiovascular: Normal rate, regular rhythm and normal heart sounds.   No murmur heard. Respiratory: Effort  normal and breath sounds normal. No respiratory distress. She has no wheezes.  GI: Soft. Bowel sounds are normal. She exhibits no distension. There is no tenderness.  Neurological: She is alert and oriented to person, place, and time.  Skin: Skin is warm and dry. She is not diaphoretic.  Psychiatric: She has a normal mood and affect. Her behavior is normal. Judgment and thought content normal.    MAU Course  Procedures Results for orders placed or  performed during the hospital encounter of 03/14/17 (from the past 24 hour(s))  Urinalysis, Routine w reflex microscopic     Status: Abnormal   Collection Time: 03/14/17  9:41 PM  Result Value Ref Range   Color, Urine YELLOW YELLOW   APPearance HAZY (A) CLEAR   Specific Gravity, Urine 1.026 1.005 - 1.030   pH 5.0 5.0 - 8.0   Glucose, UA NEGATIVE NEGATIVE mg/dL   Hgb urine dipstick NEGATIVE NEGATIVE   Bilirubin Urine NEGATIVE NEGATIVE   Ketones, ur NEGATIVE NEGATIVE mg/dL   Protein, ur NEGATIVE NEGATIVE mg/dL   Nitrite NEGATIVE NEGATIVE   Leukocytes, UA NEGATIVE NEGATIVE  Pregnancy, urine POC     Status: Abnormal   Collection Time: 03/14/17  9:48 PM  Result Value Ref Range   Preg Test, Ur POSITIVE (A) NEGATIVE  CBC     Status: Abnormal   Collection Time: 03/14/17 10:50 PM  Result Value Ref Range   WBC 13.6 (H) 4.0 - 10.5 K/uL   RBC 4.68 3.87 - 5.11 MIL/uL   Hemoglobin 14.0 12.0 - 15.0 g/dL   HCT 16.1 09.6 - 04.5 %   MCV 83.3 78.0 - 100.0 fL   MCH 29.9 26.0 - 34.0 pg   MCHC 35.9 30.0 - 36.0 g/dL   RDW 40.9 81.1 - 91.4 %   Platelets 252 150 - 400 K/uL  Basic metabolic panel     Status: Abnormal   Collection Time: 03/14/17 10:50 PM  Result Value Ref Range   Sodium 133 (L) 135 - 145 mmol/L   Potassium 3.7 3.5 - 5.1 mmol/L   Chloride 101 101 - 111 mmol/L   CO2 23 22 - 32 mmol/L   Glucose, Bld 96 65 - 99 mg/dL   BUN 8 6 - 20 mg/dL   Creatinine, Ser 7.82 0.44 - 1.00 mg/dL   Calcium 9.2 8.9 - 95.6 mg/dL   GFR calc non Af Amer >60 >60 mL/min   GFR calc Af Amer >60 >60 mL/min   Anion gap 9 5 - 15    MDM FHT 176 IV fluid bolus, phenergan  25 mg, pepcid 20 mg Pt requesting medication for headache -- will give headache cocktail d/t n/v -- decadron & benadryl (in addition to phenergan previously given) Patient reports resolution of headache & improvement in n/v. Able to tolerate POs Left voicemail with Dr. Oscar La. Patient stable. Will discharge home with rx for antiemetic.  Patient plans on calling ob tomorrow to establish care.   Assessment and Plan  A: 1. Nausea and vomiting during pregnancy prior to [redacted] weeks gestation   2. Headache in pregnancy, antepartum, first trimester   3. Fetal heart tones present, first trimester    P: Discharge home Rx phenergan Advance diet as tolerated Discussed reasons to return to MAU Establish prenatal care or call Dr. Hyacinth Meeker prn  Judeth Horn 03/14/2017, 10:16 PM

## 2017-03-15 ENCOUNTER — Encounter (HOSPITAL_COMMUNITY): Payer: Self-pay

## 2017-03-15 DIAGNOSIS — O219 Vomiting of pregnancy, unspecified: Secondary | ICD-10-CM | POA: Diagnosis not present

## 2017-03-15 MED ORDER — PROMETHAZINE HCL 25 MG PO TABS: 25.0000 mg | ORAL_TABLET | Freq: Four times a day (QID) | ORAL | 0 refills | Status: DC | PRN

## 2017-03-15 NOTE — Discharge Instructions (Signed)
Morning Sickness °Morning sickness is when you feel sick to your stomach (nauseous) during pregnancy. This nauseous feeling may or may not come with vomiting. It often occurs in the morning but can be a problem any time of day. Morning sickness is most common during the first trimester, but it may continue throughout pregnancy. While morning sickness is unpleasant, it is usually harmless unless you develop severe and continual vomiting (hyperemesis gravidarum). This condition requires more intense treatment. °What are the causes? °The cause of morning sickness is not completely known but seems to be related to normal hormonal changes that occur in pregnancy. °What increases the risk? °You are at greater risk if you: °· Experienced nausea or vomiting before your pregnancy. °· Had morning sickness during a previous pregnancy. °· Are pregnant with more than one baby, such as twins. ° °How is this treated? °Do not use any medicines (prescription, over-the-counter, or herbal) for morning sickness without first talking to your health care provider. Your health care provider may prescribe or recommend: °· Vitamin B6 supplements. °· Anti-nausea medicines. °· The herbal medicine ginger. ° °Follow these instructions at home: °· Only take over-the-counter or prescription medicines as directed by your health care provider. °· Taking multivitamins before getting pregnant can prevent or decrease the severity of morning sickness in most women. °· Eat a piece of dry toast or unsalted crackers before getting out of bed in the morning. °· Eat five or six small meals a day. °· Eat dry and bland foods (rice, baked potato). Foods high in carbohydrates are often helpful. °· Do not drink liquids with your meals. Drink liquids between meals. °· Avoid greasy, fatty, and spicy foods. °· Get someone to cook for you if the smell of any food causes nausea and vomiting. °· If you feel nauseous after taking prenatal vitamins, take the vitamins at  night or with a snack. °· Snack on protein foods (nuts, yogurt, cheese) between meals if you are hungry. °· Eat unsweetened gelatins for desserts. °· Wearing an acupressure wristband (worn for sea sickness) may be helpful. °· Acupuncture may be helpful. °· Do not smoke. °· Get a humidifier to keep the air in your house free of odors. °· Get plenty of fresh air. °Contact a health care provider if: °· Your home remedies are not working, and you need medicine. °· You feel dizzy or lightheaded. °· You are losing weight. °Get help right away if: °· You have persistent and uncontrolled nausea and vomiting. °· You pass out (faint). °This information is not intended to replace advice given to you by your health care provider. Make sure you discuss any questions you have with your health care provider. °Document Released: 11/18/2006 Document Revised: 03/04/2016 Document Reviewed: 03/14/2013 °Elsevier Interactive Patient Education © 2017 [employer] ° °

## 2017-03-16 IMAGING — CT CT ABD-PELV W/ CM
2 of 4 series · 16 of 46 positions shown, 18 images · IV contrast (iopamidol)
Comparison: None.

CLINICAL DATA: Right lower quadrant pain for 1 day

EXAM:
CT ABDOMEN AND PELVIS WITH CONTRAST
TECHNIQUE: Multidetector CT imaging of the abdomen and pelvis was performed
using the standard protocol following bolus administration of
intravenous contrast.
CONTRAST:  100mL LLWRSB-900 IOPAMIDOL (LLWRSB-900) INJECTION 61%

[Series 2: routine abd pel with · axial · 0.76mm/px · z∈[-1172,-757]mm · 13 of 93 slices shown, 15 images]
[im 5/93  soft-tissue]
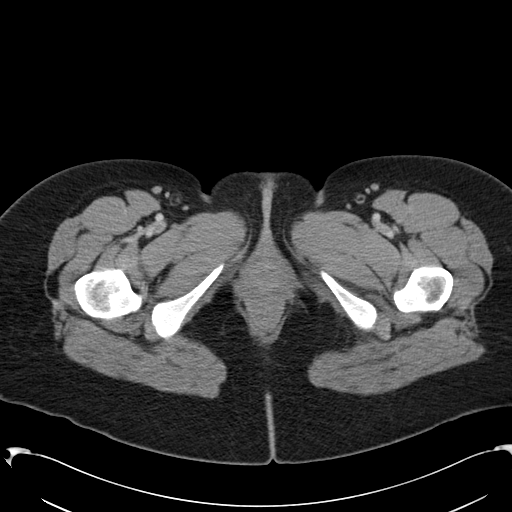
[im 5/93  bone]
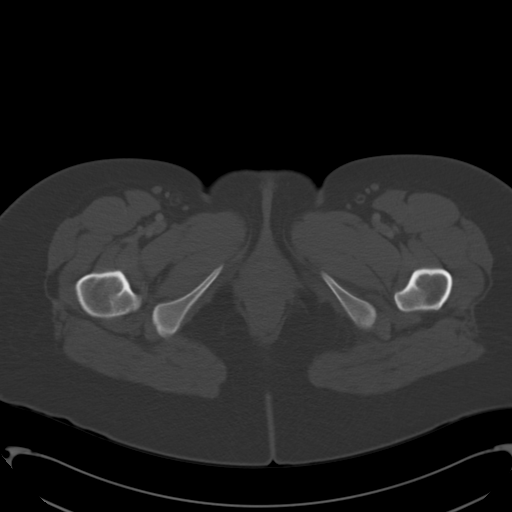
[im 13/93  soft-tissue]
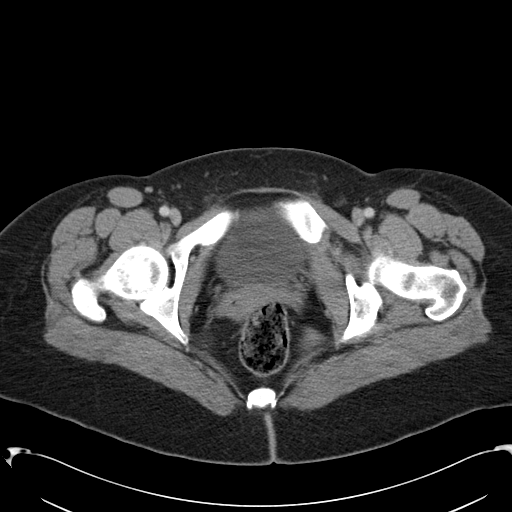
[im 21/93  soft-tissue]
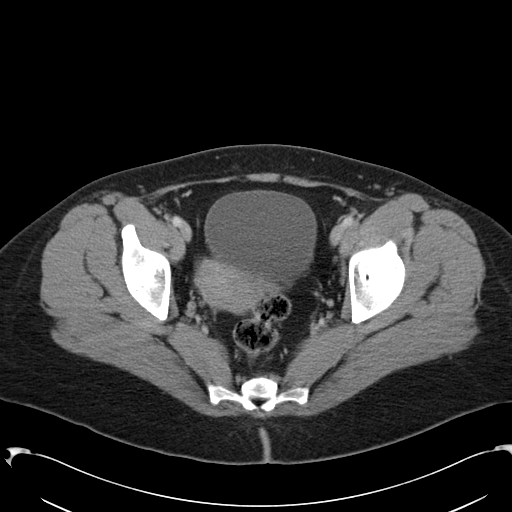
[im 26/93  soft-tissue]
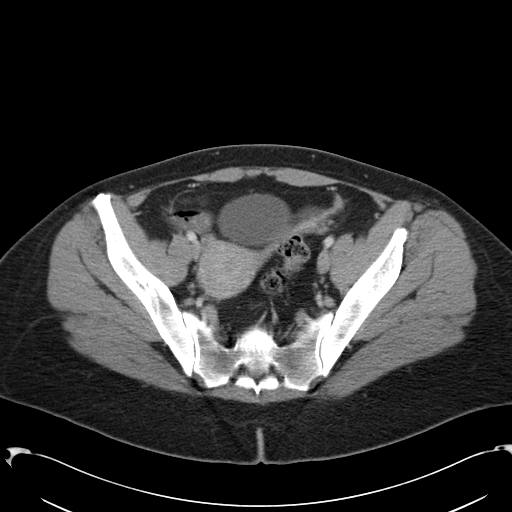
[im 34/93  soft-tissue]
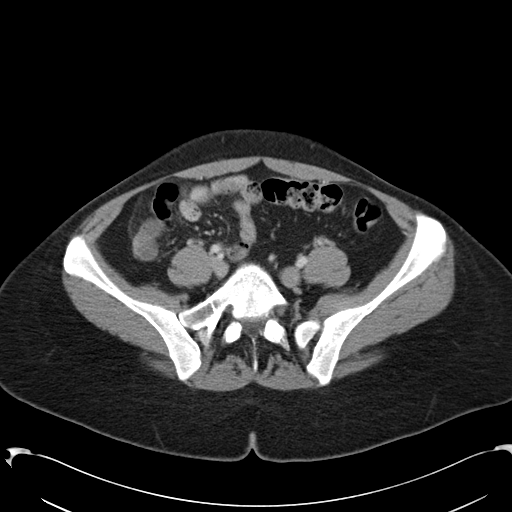
[im 38/93  soft-tissue]
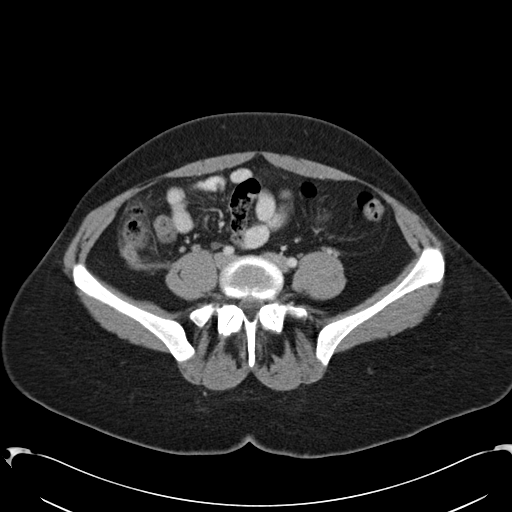
[im 47/93  soft-tissue]
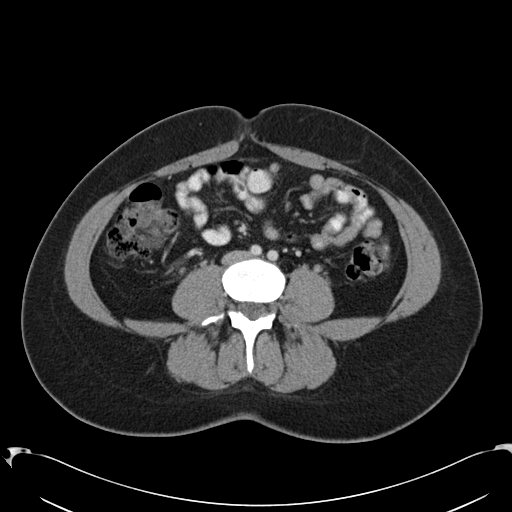
[im 55/93  soft-tissue]
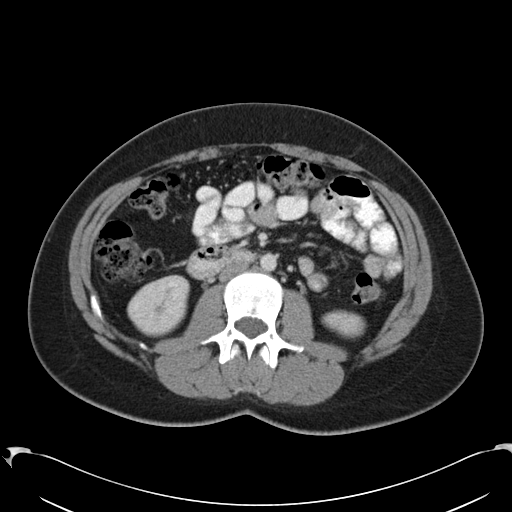
[im 59/93  soft-tissue]
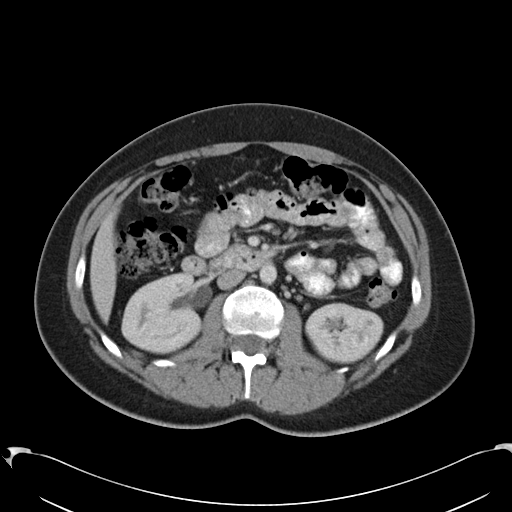
[im 59/93  bone]
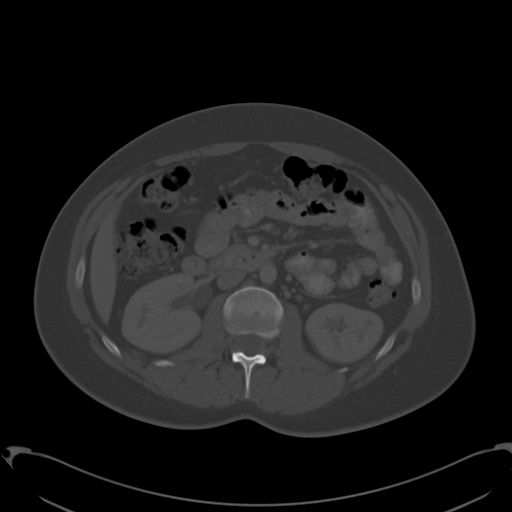
[im 67/93  soft-tissue]
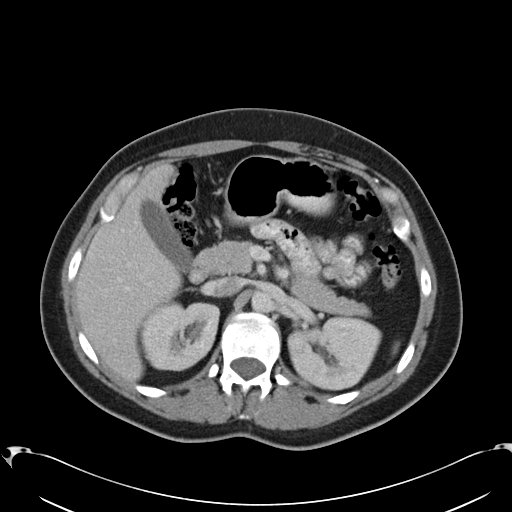
[im 72/93  soft-tissue]
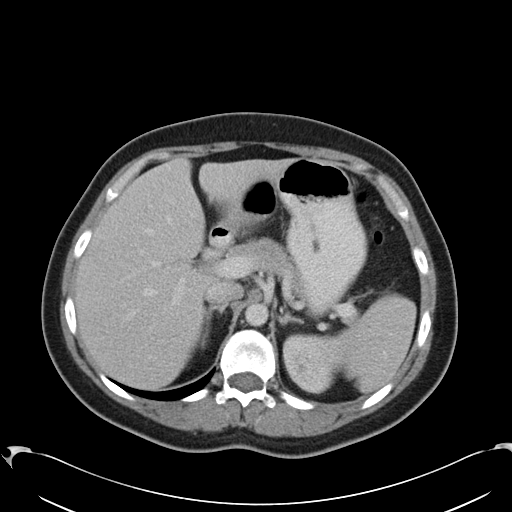
[im 80/93  soft-tissue]
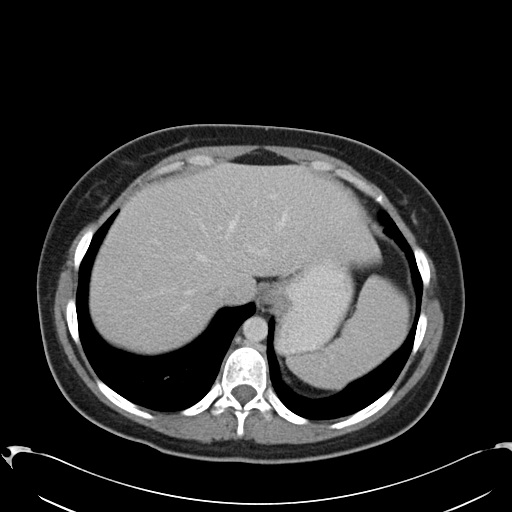
[im 88/93  soft-tissue]
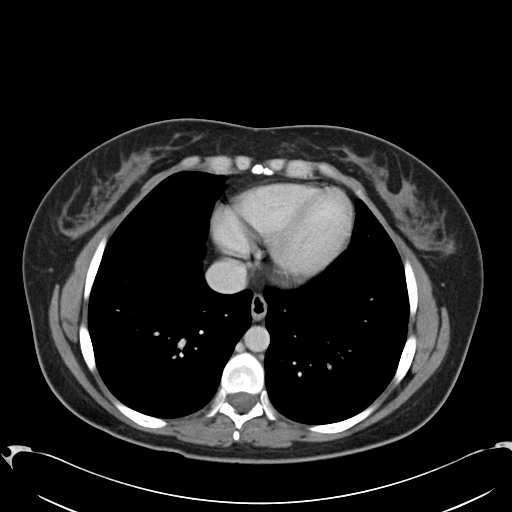

[Series 5: cor routine abd pel with · coronal · 0.75mm/px · 3 of 154 slices shown]
[im 52/154  soft-tissue]
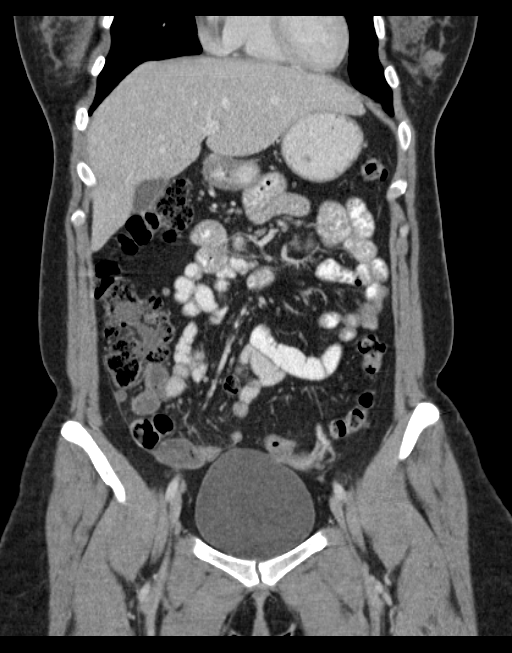
[im 69/154  soft-tissue]
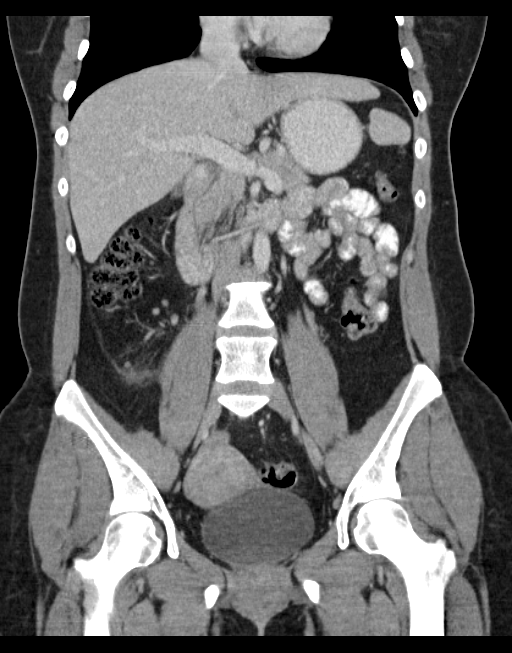
[im 86/154  soft-tissue]
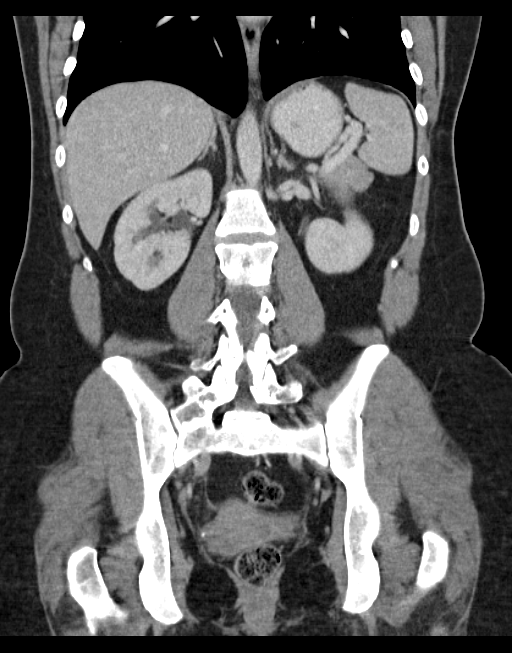

[16 of 46 positions shown; findings below may reference images not displayed]

FINDINGS: Lung bases are free of acute infiltrate or sizable effusion.

The liver, gallbladder, spleen, adrenal glands and pancreas are all
normal in their CT appearance. The kidneys are well visualized
bilaterally. No renal calculi are noted. The ureters are well
visualized to the urinary bladder without obstructive change. The
bladder is well distended.

The appendix is mildly inflamed consistent with acute appendicitis.
No changes of perforation or focal abscess are seen. The uterus and
ovaries are within normal limits. No free pelvic fluid is noted. The
osseous structures are grossly unremarkable.
IMPRESSION: Changes consistent with acute appendicitis. No perforation is
identified.

## 2017-03-18 ENCOUNTER — Ambulatory Visit (INDEPENDENT_AMBULATORY_CARE_PROVIDER_SITE_OTHER): Payer: 59 | Admitting: Obstetrics and Gynecology

## 2017-03-18 ENCOUNTER — Encounter: Payer: Self-pay | Admitting: Obstetrics and Gynecology

## 2017-03-18 DIAGNOSIS — Z6791 Unspecified blood type, Rh negative: Secondary | ICD-10-CM

## 2017-03-18 DIAGNOSIS — O09891 Supervision of other high risk pregnancies, first trimester: Secondary | ICD-10-CM

## 2017-03-18 DIAGNOSIS — Z113 Encounter for screening for infections with a predominantly sexual mode of transmission: Secondary | ICD-10-CM

## 2017-03-18 DIAGNOSIS — Z3689 Encounter for other specified antenatal screening: Secondary | ICD-10-CM

## 2017-03-18 DIAGNOSIS — Z803 Family history of malignant neoplasm of breast: Secondary | ICD-10-CM

## 2017-03-18 DIAGNOSIS — O26899 Other specified pregnancy related conditions, unspecified trimester: Secondary | ICD-10-CM | POA: Insufficient documentation

## 2017-03-18 DIAGNOSIS — Z1151 Encounter for screening for human papillomavirus (HPV): Secondary | ICD-10-CM | POA: Diagnosis not present

## 2017-03-18 DIAGNOSIS — Z124 Encounter for screening for malignant neoplasm of cervix: Secondary | ICD-10-CM | POA: Diagnosis not present

## 2017-03-18 DIAGNOSIS — O26891 Other specified pregnancy related conditions, first trimester: Secondary | ICD-10-CM

## 2017-03-18 DIAGNOSIS — Z3480 Encounter for supervision of other normal pregnancy, unspecified trimester: Secondary | ICD-10-CM | POA: Insufficient documentation

## 2017-03-18 HISTORY — DX: Family history of malignant neoplasm of breast: Z80.3

## 2017-03-18 LAB — OB RESULTS CONSOLE HIV ANTIBODY (ROUTINE TESTING): HIV: NONREACTIVE

## 2017-03-18 MED ORDER — DOXYLAMINE-PYRIDOXINE ER 20-20 MG PO TBCR: 1.0000 | EXTENDED_RELEASE_TABLET | Freq: Every day | ORAL | 6 refills | Status: DC

## 2017-03-18 NOTE — Progress Notes (Signed)
New OB Note  03/18/2017   Clinic: [facility]  Chief Complaint: NOB  Transfer of Care Patient: No  History of Present Illness: Kathy Aguilar is a 31 y.o. G3P1011 @ 11/0 weeks (EDC 12/28, based on Patient's last menstrual period was 12/31/2016.=11wk u/s today).  Preg complicated by has Infertility, female, secondary; Supervision of other normal pregnancy, antepartum; Rh negative state in antepartum period, first trimester; and Family history of breast cancer on her problem list.   Any events prior to today's visit: no Her periods were: regular, qmonth She was using no method when she conceived (planned pregnancy) She has Positive signs or symptoms of nausea/vomiting of pregnancy but much improved after doing phenergan qhs from the MAU She has Negative signs or symptoms of miscarriage or preterm labor On any different medications around the time she conceived/early pregnancy: No   ROS: A 12-point review of systems was performed and negative, except as stated in the above HPI.  OBGYN History: As per HPI. OB History  Gravida Para Term Preterm AB Living  3 1 1  0 1 1  SAB TAB Ectopic Multiple Live Births  1 0 0 0 1    # Outcome Date GA Lbr Len/2nd Weight Sex Delivery Anes PTL Lv  3 Current           2 SAB 10/2016          1 Term 10/18/05   7 lb (3.175 kg) F Vag-Spont  N LIV     Any issues with any prior pregnancies: no Prior children are healthy, doing well, and without any problems or issues: yes History of pap smears: Yes. Last pap smear 2 or 3 years ago? and results were negative and no h/o negative.    Past Medical History: Past Medical History:  Diagnosis Date  . Appendicitis   . Family history of adverse reaction to anesthesia    mother has vomiting after anesthesia    Past Surgical History: Past Surgical History:  Procedure Laterality Date  . BILATERAL SALPINGECTOMY Right 08/17/2016   Procedure: RIGHT SALPINGECTOMY;  Surgeon: Jerene Bears,  MD;  Location: [facility];  Service: Gynecology;  Laterality: Right;  . CHROMOPERTUBATION Bilateral 08/17/2016   Procedure: CHROMOPERTUBATION;  Surgeon: Jerene Bears, MD;  Location: [facility];  Service: Gynecology;  Laterality: Bilateral;  . LAPAROSCOPIC APPENDECTOMY N/A 01/14/2016   Procedure: APPENDECTOMY LAPAROSCOPIC;  Surgeon: Lattie Haw, MD;  Location: [facility];  Service: General;  Laterality: N/A;  . LAPAROSCOPIC LYSIS OF ADHESIONS  08/17/2016   Procedure: LAPAROSCOPIC LYSIS OF ADHESIONS;  Surgeon: Jerene Bears, MD;  Location: [facility];  Service: Gynecology;;  . LAPAROSCOPY N/A 08/17/2016   Procedure: LAPAROSCOPY DIAGNOSTIC with left paratubal cystectomy;  Surgeon: Jerene Bears, MD;  Location: [facility];  Service: Gynecology;  Laterality: N/A;  . SALPINGECTOMY Right     Family History:  Family History  Problem Relation Age of Onset  . Breast cancer Mother 17  . Osteoarthritis Maternal Grandmother   . Hypertension Maternal Grandmother   . Cancer Maternal Grandmother        blood cancer?  . Kidney disease Maternal Grandmother   . Cancer Maternal Grandfather 70       prostate cancer  . Kidney disease Paternal Grandmother     Social History:  Social History   Social History  . Marital status: Married    Spouse name: N/A  . Number of children: N/A  . Years of education: N/A  Occupational History  . Not on file.   Social History Main Topics  . Smoking status: Never Smoker  . Smokeless tobacco: Never Used  . Alcohol use No  . Drug use: No  . Sexual activity: Yes    Partners: Male    Birth control/ protection: None   Other Topics Concern  . Not on file   Social History Narrative  . No narrative on file    Allergy: No Known Allergies  Health Maintenance:  Mammogram Up to Date: not applicable  Current Outpatient Medications: PNV, phenergan PRN  Physical Exam:   BP 132/84   Pulse (!) 118   Wt 182 lb (82.6 kg)   LMP 12/31/2016   BMI 28.51 kg/m  Body mass  index is 28.51 kg/m. Contractions: Not present Vag. Bleeding: None. FHTs: 170s  General appearance: Well nourished, well developed female in no acute distress.  Neck:  Supple, normal appearance, and no thyromegaly  Cardiovascular: S1, S2 normal, no murmur, rub or gallop, regular rate and rhythm Respiratory:  Clear to auscultation bilateral. Normal respiratory effort Abdomen: positive bowel sounds and no masses, hernias; diffusely non tender to palpation, non distended Breasts: breasts appear normal, no suspicious masses, no skin or nipple changes or axillary nodes, and normal palpation. Neuro/Psych:  Normal mood and affect.  Skin:  Warm and dry.  Lymphatic:  No inguinal lymphadenopathy.   Pelvic exam: is not limited by body habitus EGBUS: within normal limits, Vagina: within normal limits and with no blood in the vault, Cervix: normal appearing cervix without discharge or lesions, closed/long/high, Uterus:  enlarged, c/w 12 week size, and Adnexa:  normal adnexa and no mass, fullness, tenderness  Laboratory: deferred  Imaging:  Per RN note (SLIUP c/w LMP, normal FHR)  Assessment: pt doing well  Plan: 1. Supervision of other normal pregnancy, antepartum Routine care. Declines genetics. Set up for anatomy u/s at 18-20wks  D/w pt recommend start br cancer surveillance in her late 30s.  - Obstetric Panel, Including HIV - Culture, OB Urine - Cytology - PAP - US MFM OB DETAIL +14 WK; Future  2. Rh negative state in antepartum period, first trimester Rhogam at 28wks and PP PRN  Problem list reviewed and updated.  Follow up in 3 weeks.  The nature of [facility] with multiple MDs and other Advanced Practice Providers was explained to patient; also emphasized that residents, students are part of our team.  >50% of 25 min visit spent on counseling and coordination of care.     Cornelia Copaharlie Drishti Pepperman, Jr. MD Attending Center for [facility]  [facility](Faculty Practice)

## 2017-03-18 NOTE — Progress Notes (Signed)
Last pap approximately 2016  Declines any genetic screening [place] sent to pt pharmacy DATING AND VIABILITY SONOGRAM   Kathy HeraldHeather M Aguilar is a 31 y.o. year old G3P1011 with LMP Patient's last menstrual period was 12/31/2016. which would correlate to  2361w0d weeks gestation.  She has regular menstrual cycles.   She is here today for a confirmatory initial sonogram.    GESTATION: SINGLETON      FETAL ACTIVITY:          Heart rate         170bpm          The fetus is active.   GESTATIONAL AGE AND  BIOMETRICS:  Gestational criteria: Estimated Date of Delivery: 10/07/17 by LMP now at 5961w0d  Previous Scans:0 CROWN RUMP LENGTH           4.28 mm         5761w0d                                                   AVERAGE EGA(BY THIS SCAN): 11.0 weeks  WORKING EDD( LMP ):  10/07/17     TECHNICIAN COMMENTS:  Bedside US shows SLIUP measuring 7061w0d which is consistent with LMP. Active fetal movement with FHR 170bpm   A copy of this report including all images has been saved and backed up to a second source for retrieval if needed. All measures and details of the anatomical scan, placentation, fluid volume and pelvic anatomy are contained in that report.  Kathy Aguilar 03/18/2017 9:23 AM

## 2017-03-19 LAB — OBSTETRIC PANEL, INCLUDING HIV
ANTIBODY SCREEN: NEGATIVE
BASOS: 0 %
Basophils Absolute: 0 10*3/uL (ref 0.0–0.2)
EOS (ABSOLUTE): 0.2 10*3/uL (ref 0.0–0.4)
EOS: 2 %
HEMATOCRIT: 40.5 % (ref 34.0–46.6)
HEP B S AG: NEGATIVE
HIV Screen 4th Generation wRfx: NONREACTIVE
Hemoglobin: 13.3 g/dL (ref 11.1–15.9)
IMMATURE GRANS (ABS): 0 10*3/uL (ref 0.0–0.1)
Immature Granulocytes: 0 %
LYMPHS: 19 %
Lymphocytes Absolute: 2.1 10*3/uL (ref 0.7–3.1)
MCH: 28.8 pg (ref 26.6–33.0)
MCHC: 32.8 g/dL (ref 31.5–35.7)
MCV: 88 fL (ref 79–97)
MONOCYTES: 8 %
Monocytes Absolute: 0.8 10*3/uL (ref 0.1–0.9)
Neutrophils Absolute: 7.7 10*3/uL — ABNORMAL HIGH (ref 1.4–7.0)
Neutrophils: 71 %
Platelets: 257 10*3/uL (ref 150–379)
RBC: 4.62 x10E6/uL (ref 3.77–5.28)
RDW: 13.2 % (ref 12.3–15.4)
RH TYPE: NEGATIVE
RPR: NONREACTIVE
RUBELLA: 1.06 {index} (ref >0.99)
WBC: 10.8 10*3/uL (ref 3.4–10.8)

## 2017-03-20 LAB — URINE CULTURE, OB REFLEX

## 2017-03-20 LAB — CULTURE, OB URINE

## 2017-03-21 ENCOUNTER — Telehealth: Payer: Self-pay | Admitting: *Deleted

## 2017-03-21 NOTE — Telephone Encounter (Signed)
error 

## 2017-03-22 NOTE — Addendum Note (Signed)
Addended by: Arne ClevelandHUTCHINSON, MANDY J on: 03/22/2017 11:20 AM   Modules accepted: Orders

## 2017-03-23 LAB — CYTOLOGY - PAP
CHLAMYDIA, DNA PROBE: NEGATIVE
HPV: DETECTED — AB
NEISSERIA GONORRHEA: NEGATIVE

## 2017-03-24 ENCOUNTER — Encounter: Payer: Self-pay | Admitting: Obstetrics and Gynecology

## 2017-03-24 DIAGNOSIS — N879 Dysplasia of cervix uteri, unspecified: Secondary | ICD-10-CM | POA: Insufficient documentation

## 2017-04-25 ENCOUNTER — Encounter: Payer: Self-pay | Admitting: Obstetrics and Gynecology

## 2017-04-25 ENCOUNTER — Ambulatory Visit (INDEPENDENT_AMBULATORY_CARE_PROVIDER_SITE_OTHER): Payer: 59 | Admitting: Obstetrics and Gynecology

## 2017-04-25 ENCOUNTER — Encounter: Payer: 59 | Admitting: Obstetrics and Gynecology

## 2017-04-25 VITALS — BP 123/75 | HR 105 | Wt 183.6 lb

## 2017-04-25 DIAGNOSIS — Z3482 Encounter for supervision of other normal pregnancy, second trimester: Secondary | ICD-10-CM

## 2017-04-25 DIAGNOSIS — N871 Moderate cervical dysplasia: Secondary | ICD-10-CM

## 2017-04-25 DIAGNOSIS — Z3480 Encounter for supervision of other normal pregnancy, unspecified trimester: Secondary | ICD-10-CM

## 2017-04-25 NOTE — Progress Notes (Signed)
Prenatal Visit Note Date: 04/25/2017 [facility]  Subjective:  Kathy Aguilar is a 31 y.o. G3P1011 at 6058w3d being seen today for ongoing prenatal care.  She is currently monitored for the following issues for this low-risk pregnancy and has Infertility, female, secondary; Supervision of other normal pregnancy, antepartum; Rh negative state in antepartum period, first trimester; Family history of breast cancer; and Cervical dysplasia on her problem list.  Patient reports no complaints.   Contractions: Not present. Vag. Bleeding: None.  Movement: Present. Denies leaking of fluid.   The following portions of the patient's history were reviewed and updated as appropriate: allergies, current medications, past family history, past medical history, past social history, past surgical history and problem list. Problem list updated.  Objective:   Vitals:   04/25/17 1320  BP: 123/75  Pulse: (!) 105  Weight: 183 lb 9.6 oz (83.3 kg)    Fetal Status: Fetal Heart Rate (bpm): 160s   Movement: Present     General:  Alert, oriented and cooperative. Patient is in no acute distress.  Skin: Skin is warm and dry. No rash noted.   Cardiovascular: Normal heart rate noted  Respiratory: Normal respiratory effort, no problems with respiration noted  Abdomen: Soft, gravid, appropriate for gestational age. Pain/Pressure: Absent     Pelvic:  Cervical exam performed Dilation: Closed Effacement (%): Thick    Extremities: Normal range of motion.  Edema: None  Mental Status: Normal mood and affect. Normal behavior. Normal judgment and thought content.   Urinalysis:      Assessment and Plan:  Pregnancy: G3P1011 at 10058w3d  1. Dysplasia of cervix, high grade CIN 2 See colpo note - Surgical pathology  2. Pregnancy Declines genetics. Anatomy u/s already scheduled.   Preterm labor symptoms and general obstetric precautions including but not limited to vaginal bleeding, contractions,  leaking of fluid and fetal movement were reviewed in detail with the patient. Please refer to After Visit Summary for other counseling recommendations.  Return in about 4 weeks (around 05/23/2017) for can cancel her rob in two weeks with Dr. Macon LargeAnyanwu.   [place] BingPickens, Alfio Loescher, MD

## 2017-04-25 NOTE — Procedures (Addendum)
Colposcopy Procedure Note  Pre-operative Diagnosis: LSIL, HPV +; a few cells suggestive of HSIL. 16/3 weeks  Post-operative Diagnosis: CIN 2.   Procedure Details   The risks (including infection, bleeding, pain) and benefits of the procedure were explained to the patient and written informed consent was obtained.  The patient was placed in the dorsal lithotomy position. A Graves was speculum inserted in the vagina, and the cervix was visualized.  AA staining done Lugol's with green filter.  Biopsy 7 o'clock on the uterine cervix. No bleeding after procedure after application of silver nitrate  Findings: with AA staining, there is diffuse AWE changes circumferentially. Most prominently at 7 o'clock, there is some mosaicism and increased friability of the TZ (also seen at 10 and 1 o'clock). Lugol's and green filter confirm.   Cervix was closed, nttp, long and thick  Adequate: yes  Specimens: 7 o'clock  Condition: Stable  Complications: None  Plan: The patient was advised to call for any fever or for prolonged or severe pain or bleeding. She was advised to use OTC analgesics as needed for mild to moderate pain. She was advised to avoid vaginal intercourse for 48 hours or until the bleeding has completely stopped.   Cornelia Copaharlie Robin Pafford, Jr MD Attending Center for [facility](Faculty Practice)

## 2017-05-10 ENCOUNTER — Encounter: Payer: 59 | Admitting: Obstetrics & Gynecology

## 2017-05-13 ENCOUNTER — Ambulatory Visit (HOSPITAL_COMMUNITY)
Admission: RE | Admit: 2017-05-13 | Discharge: 2017-05-13 | Disposition: A | Discharge status: Home / Self Care | Payer: 59 | Source: Ambulatory Visit | Attending: Obstetrics and Gynecology | Admitting: Obstetrics and Gynecology

## 2017-05-13 ENCOUNTER — Other Ambulatory Visit: Payer: Self-pay | Admitting: Obstetrics and Gynecology

## 2017-05-13 DIAGNOSIS — Z3A19 19 weeks gestation of pregnancy: Secondary | ICD-10-CM

## 2017-05-13 DIAGNOSIS — Z3480 Encounter for supervision of other normal pregnancy, unspecified trimester: Secondary | ICD-10-CM

## 2017-05-13 DIAGNOSIS — Z3689 Encounter for other specified antenatal screening: Secondary | ICD-10-CM

## 2017-05-16 ENCOUNTER — Encounter: Payer: Self-pay | Admitting: Obstetrics and Gynecology

## 2017-05-16 DIAGNOSIS — O43199 Other malformation of placenta, unspecified trimester: Secondary | ICD-10-CM | POA: Insufficient documentation

## 2017-05-20 ENCOUNTER — Encounter: Payer: Self-pay | Admitting: *Deleted

## 2017-05-20 DIAGNOSIS — Z3480 Encounter for supervision of other normal pregnancy, unspecified trimester: Secondary | ICD-10-CM

## 2017-05-25 ENCOUNTER — Ambulatory Visit (INDEPENDENT_AMBULATORY_CARE_PROVIDER_SITE_OTHER): Payer: 59 | Admitting: Obstetrics and Gynecology

## 2017-05-25 VITALS — BP 135/79 | HR 105 | Wt 189.0 lb

## 2017-05-25 DIAGNOSIS — Z3482 Encounter for supervision of other normal pregnancy, second trimester: Secondary | ICD-10-CM

## 2017-05-25 DIAGNOSIS — Z3480 Encounter for supervision of other normal pregnancy, unspecified trimester: Secondary | ICD-10-CM

## 2017-05-25 DIAGNOSIS — O26891 Other specified pregnancy related conditions, first trimester: Secondary | ICD-10-CM

## 2017-05-25 DIAGNOSIS — Z6791 Unspecified blood type, Rh negative: Secondary | ICD-10-CM

## 2017-05-25 NOTE — Progress Notes (Signed)
Prenatal Visit Note Date: 05/25/2017 Clinic: [facility]  Subjective:  Kathy Aguilar is a 31 y.o. G3P1011 at 5850w5d being seen today for ongoing prenatal care.  She is currently monitored for the following issues for this low-risk pregnancy and has Infertility, female, secondary; Supervision of other normal pregnancy, antepartum; Rh negative state in antepartum period, first trimester; Family history of breast cancer; Cervical dysplasia; and Placenta succenturiata on her problem list.  Patient reports no complaints.   Contractions: Not present. Vag. Bleeding: None.  Movement: Present. Denies leaking of fluid.   The following portions of the patient's history were reviewed and updated as appropriate: allergies, current medications, past family history, past medical history, past social history, past surgical history and problem list. Problem list updated.  Objective:   Vitals:   05/25/17 0858  BP: 135/79  Pulse: (!) 105  Weight: 189 lb (85.7 kg)    Fetal Status: Fetal Heart Rate (bpm): 154   Movement: Present     General:  Alert, oriented and cooperative. Patient is in no acute distress.  Skin: Skin is warm and dry. No rash noted.   Cardiovascular: Normal heart rate noted  Respiratory: Normal respiratory effort, no problems with respiration noted  Abdomen: Soft, gravid, appropriate for gestational age. Pain/Pressure: Absent     Pelvic:  Cervical exam deferred        Extremities: Normal range of motion.  Edema: None  Mental Status: Normal mood and affect. Normal behavior. Normal judgment and thought content.   Urinalysis:      Assessment and Plan:  Pregnancy: G3P1011 at 7550w5d  1. Supervision of other normal pregnancy, antepartum No issues  2. Rh negative state in antepartum period, first trimester  Preterm labor symptoms and general obstetric precautions including but not limited to vaginal bleeding, contractions, leaking of fluid and fetal movement were  reviewed in detail with the patient. Please refer to After Visit Summary for other counseling recommendations.  Return in about 4 weeks (around 06/22/2017) for rob.   [place] BingPickens, Kamaljit Hizer, MD

## 2017-06-22 ENCOUNTER — Ambulatory Visit (INDEPENDENT_AMBULATORY_CARE_PROVIDER_SITE_OTHER): Payer: 59 | Admitting: Obstetrics and Gynecology

## 2017-06-22 VITALS — BP 109/72 | HR 112 | Wt 196.0 lb

## 2017-06-22 DIAGNOSIS — Z3482 Encounter for supervision of other normal pregnancy, second trimester: Secondary | ICD-10-CM

## 2017-06-22 DIAGNOSIS — O09891 Supervision of other high risk pregnancies, first trimester: Secondary | ICD-10-CM

## 2017-06-22 DIAGNOSIS — Z6791 Unspecified blood type, Rh negative: Secondary | ICD-10-CM

## 2017-06-22 DIAGNOSIS — Z3480 Encounter for supervision of other normal pregnancy, unspecified trimester: Secondary | ICD-10-CM

## 2017-06-22 DIAGNOSIS — O26891 Other specified pregnancy related conditions, first trimester: Secondary | ICD-10-CM

## 2017-06-22 DIAGNOSIS — O43192 Other malformation of placenta, second trimester: Secondary | ICD-10-CM

## 2017-06-22 NOTE — Progress Notes (Signed)
Pt c/o bilateral leg cramps at night - right side mostly during the day

## 2017-06-22 NOTE — Progress Notes (Signed)
   PRENATAL VISIT NOTE  Subjective:  Kathy HeraldHeather M Aguilar is a 31 y.o. G3P1011 at [redacted]w[redacted]d being seen today for ongoing prenatal care.  She is currently monitored for the following issues for this low-risk pregnancy and has Infertility, female, secondary; Supervision of other normal pregnancy, antepartum; Rh negative state in antepartum period, first trimester; Family history of breast cancer; Cervical dysplasia; and Placenta succenturiata on her problem list.  Patient reports no complaints.  Contractions: Not present. Vag. Bleeding: None.  Movement: Present. Denies leaking of fluid.   The following portions of the patient's history were reviewed and updated as appropriate: allergies, current medications, past family history, past medical history, past social history, past surgical history and problem list. Problem list updated.  Objective:   Vitals:   06/22/17 0852  BP: 109/72  Pulse: (!) 112  Weight: 196 lb (88.9 kg)    Fetal Status: Fetal Heart Rate (bpm): 150 Fundal Height: 25 cm Movement: Present     General:  Alert, oriented and cooperative. Patient is in no acute distress.  Skin: Skin is warm and dry. No rash noted.   Cardiovascular: Normal heart rate noted  Respiratory: Normal respiratory effort, no problems with respiration noted  Abdomen: Soft, gravid, appropriate for gestational age.  Pain/Pressure: Absent     Pelvic: Cervical exam deferred        Extremities: Normal range of motion.  Edema: None  Mental Status:  Normal mood and affect. Normal behavior. Normal judgment and thought content.   Assessment and Plan:  Pregnancy: G3P1011 at [redacted]w[redacted]d  1. Supervision of other normal pregnancy, antepartum Patient is doing well without complaints Third trimester labs, glucola, and tdap  2. Placenta succenturiata in second trimester No previa  3. Rh negative state in antepartum period, first trimester Rhogam next visit  Preterm labor symptoms and general obstetric precautions  including but not limited to vaginal bleeding, contractions, leaking of fluid and fetal movement were reviewed in detail with the patient. Please refer to After Visit Summary for other counseling recommendations.  Return in about 4 weeks (around 07/20/2017) for ROB, 2 hr glucola next visit.   Catalina AntiguaPeggy Mikesha Migliaccio, MD

## 2017-07-13 ENCOUNTER — Encounter: Payer: Self-pay | Admitting: Obstetrics & Gynecology

## 2017-07-20 ENCOUNTER — Ambulatory Visit (INDEPENDENT_AMBULATORY_CARE_PROVIDER_SITE_OTHER): Payer: 59 | Admitting: Family Medicine

## 2017-07-20 VITALS — BP 113/77 | HR 114 | Wt 199.4 lb

## 2017-07-20 DIAGNOSIS — Z23 Encounter for immunization: Secondary | ICD-10-CM

## 2017-07-20 DIAGNOSIS — O360930 Maternal care for other rhesus isoimmunization, third trimester, not applicable or unspecified: Secondary | ICD-10-CM | POA: Diagnosis not present

## 2017-07-20 DIAGNOSIS — Z3483 Encounter for supervision of other normal pregnancy, third trimester: Secondary | ICD-10-CM

## 2017-07-20 DIAGNOSIS — K219 Gastro-esophageal reflux disease without esophagitis: Secondary | ICD-10-CM

## 2017-07-20 DIAGNOSIS — Z3480 Encounter for supervision of other normal pregnancy, unspecified trimester: Secondary | ICD-10-CM

## 2017-07-20 LAB — CBC
HEMATOCRIT: 32.3 % — AB (ref 34.0–46.6)
Hemoglobin: 10.8 g/dL — ABNORMAL LOW (ref 11.1–15.9)
MCH: 29.3 pg (ref 26.6–33.0)
MCHC: 33.4 g/dL (ref 31.5–35.7)
MCV: 88 fL (ref 79–97)
PLATELETS: 272 10*3/uL (ref 150–379)
RBC: 3.68 x10E6/uL — AB (ref 3.77–5.28)
RDW: 14.5 % (ref 12.3–15.4)
WBC: 11 10*3/uL — AB (ref 3.4–10.8)

## 2017-07-20 MED ORDER — RHO D IMMUNE GLOBULIN 1500 UNIT/2ML IJ SOSY
300.0000 ug | PREFILLED_SYRINGE | Freq: Once | INTRAMUSCULAR | Status: AC
  Administered 2017-07-20: 300 ug via INTRAMUSCULAR

## 2017-07-20 MED ORDER — OMEPRAZOLE MAGNESIUM 20 MG PO TBEC: 20.0000 mg | DELAYED_RELEASE_TABLET | Freq: Every day | ORAL | 3 refills | Status: DC

## 2017-07-20 NOTE — Progress Notes (Signed)
   PRENATAL VISIT NOTE  Subjective:  Kathy Aguilar is a 31 y.o. G3P1011 at [redacted]w[redacted]d being seen today for ongoing prenatal care.  She is currently monitored for the following issues for this low-risk pregnancy and has Infertility, female, secondary; Supervision of other normal pregnancy, antepartum; Rh negative state in antepartum period, first trimester; Family history of breast cancer; Cervical dysplasia; and Placenta succenturiata on her problem list.  Patient reports heartburn.  Contractions: Irregular.  .  Movement: Present. Denies leaking of fluid.   The following portions of the patient's history were reviewed and updated as appropriate: allergies, current medications, past family history, past medical history, past social history, past surgical history and problem list. Problem list updated.  Objective:   Vitals:   07/20/17 0823  BP: 113/77  Pulse: (!) 114  Weight: 199 lb 6.4 oz (90.4 kg)    Fetal Status:   Fundal Height: 29 cm Movement: Present     General:  Alert, oriented and cooperative. Patient is in no acute distress.  Skin: Skin is warm and dry. No rash noted.   Cardiovascular: Normal heart rate noted  Respiratory: Normal respiratory effort, no problems with respiration noted  Abdomen: Soft, gravid, appropriate for gestational age.  Pain/Pressure: Absent     Pelvic: Cervical exam deferred        Extremities: Normal range of motion.  Edema: None  Mental Status:  Normal mood and affect. Normal behavior. Normal judgment and thought content.   Assessment and Plan:  Pregnancy: G3P1011 at [redacted]w[redacted]d  1. Supervision of other normal pregnancy, antepartum 28 wk labs today - CBC - RPR - HIV antibody - Glucose Tolerance, 2 Hours w/1 Hour - Antibody screen; Future - rho (d) immune globulin (RHIG/RHOPHYLAC) injection 300 mcg; Inject 2 mLs (300 mcg total) into the muscle once. - Antibody screen  2. Immunization due  - Flu Vaccine QUAD 36+ mos IM (Fluarix, Quad PF)  3.  Gastroesophageal reflux disease without esophagitis Trial of Prilosec. - omeprazole (PRILOSEC OTC) 20 MG tablet; Take 1 tablet (20 mg total) by mouth daily.  Dispense: 30 tablet; Refill: 3  Preterm labor symptoms and general obstetric precautions including but not limited to vaginal bleeding, contractions, leaking of fluid and fetal movement were reviewed in detail with the patient. Please refer to After Visit Summary for other counseling recommendations.  Return in 2 weeks (on 08/03/2017).   Reva Bores, MD

## 2017-07-20 NOTE — Patient Instructions (Addendum)
Contraception Choices Contraception (birth control) is the use of any methods or devices to prevent pregnancy. Below are some methods to help avoid pregnancy. Hormonal methods  Contraceptive implant. This is a thin, plastic tube containing progesterone hormone. It does not contain estrogen hormone. Your health care provider inserts the tube in the inner part of the upper arm. The tube can remain in place for up to 3 years. After 3 years, the implant must be removed. The implant prevents the ovaries from releasing an egg (ovulation), thickens the cervical mucus to prevent sperm from entering the uterus, and thins the lining of the inside of the uterus.  Progesterone-only injections. These injections are given every 3 months by your health care provider to prevent pregnancy. This synthetic progesterone hormone stops the ovaries from releasing eggs. It also thickens cervical mucus and changes the uterine lining. This makes it harder for sperm to survive in the uterus.  Birth control pills. These pills contain estrogen and progesterone hormone. They work by preventing the ovaries from releasing eggs (ovulation). They also cause the cervical mucus to thicken, preventing the sperm from entering the uterus. Birth control pills are prescribed by a health care provider.Birth control pills can also be used to treat heavy periods.  Minipill. This type of birth control pill contains only the progesterone hormone. They are taken every day of each month and must be prescribed by your health care provider.  Birth control patch. The patch contains hormones similar to those in birth control pills. It must be changed once a week and is prescribed by a health care provider.  Vaginal ring. The ring contains hormones similar to those in birth control pills. It is left in the vagina for 3 weeks, removed for 1 week, and then a new one is put back in place. The patient must be comfortable inserting and removing the ring  from the vagina.A health care provider's prescription is necessary.  Emergency contraception. Emergency contraceptives prevent pregnancy after unprotected sexual intercourse. This pill can be taken right after sex or up to 5 days after unprotected sex. It is most effective the sooner you take the pills after having sexual intercourse. Most emergency contraceptive pills are available without a prescription. Check with your pharmacist. Do not use emergency contraception as your only form of birth control. Barrier methods  Female condom. This is a thin sheath (latex or rubber) that is worn over the penis during sexual intercourse. It can be used with spermicide to increase effectiveness.  Female condom. This is a soft, loose-fitting sheath that is put into the vagina before sexual intercourse.  Diaphragm. This is a soft, latex, dome-shaped barrier that must be fitted by a health care provider. It is inserted into the vagina, along with a spermicidal jelly. It is inserted before intercourse. The diaphragm should be left in the vagina for 6 to 8 hours after intercourse.  Cervical cap. This is a round, soft, latex or plastic cup that fits over the cervix and must be fitted by a health care provider. The cap can be left in place for up to 48 hours after intercourse.  Sponge. This is a soft, circular piece of polyurethane foam. The sponge has spermicide in it. It is inserted into the vagina after wetting it and before sexual intercourse.  Spermicides. These are chemicals that kill or block sperm from entering the cervix and uterus. They come in the form of creams, jellies, suppositories, foam, or tablets. They do not require a prescription. They  are inserted into the vagina with an applicator before having sexual intercourse. The process must be repeated every time you have sexual intercourse. Intrauterine contraception  Intrauterine device (IUD). This is a T-shaped device that is put in a woman's uterus  during a menstrual period to prevent pregnancy. There are 2 types: ? Copper IUD. This type of IUD is wrapped in copper wire and is placed inside the uterus. Copper makes the uterus and fallopian tubes produce a fluid that kills sperm. It can stay in place for 10 years. ? Hormone IUD. This type of IUD contains the hormone progestin (synthetic progesterone). The hormone thickens the cervical mucus and prevents sperm from entering the uterus, and it also thins the uterine lining to prevent implantation of a fertilized egg. The hormone can weaken or kill the sperm that get into the uterus. It can stay in place for 3-5 years, depending on which type of IUD is used. Permanent methods of contraception  Female tubal ligation. This is when the woman's fallopian tubes are surgically sealed, tied, or blocked to prevent the egg from traveling to the uterus.  Hysteroscopic sterilization. This involves placing a small coil or insert into each fallopian tube. Your doctor uses a technique called hysteroscopy to do the procedure. The device causes scar tissue to form. This results in permanent blockage of the fallopian tubes, so the sperm cannot fertilize the egg. It takes about 3 months after the procedure for the tubes to become blocked. You must use another form of birth control for these 3 months.  Female sterilization. This is when the female has the tubes that carry sperm tied off (vasectomy).This blocks sperm from entering the vagina during sexual intercourse. After the procedure, the man can still ejaculate fluid (semen). Natural planning methods  Natural family planning. This is not having sexual intercourse or using a barrier method (condom, diaphragm, cervical cap) on days the woman could become pregnant.  Calendar method. This is keeping track of the length of each menstrual cycle and identifying when you are fertile.  Ovulation method. This is avoiding sexual intercourse during ovulation.  Symptothermal  method. This is avoiding sexual intercourse during ovulation, using a thermometer and ovulation symptoms.  Post-ovulation method. This is timing sexual intercourse after you have ovulated. Regardless of which type or method of contraception you choose, it is important that you use condoms to protect against the transmission of sexually transmitted infections (STIs). Talk with your health care provider about which form of contraception is most appropriate for you. This information is not intended to replace advice given to you by your health care provider. Make sure you discuss any questions you have with your health care provider. Document Released: 09/27/2005 Document Revised: 03/04/2016 Document Reviewed: 03/22/2013 Elsevier Interactive Patient Education  2017 [employer].  Third Trimester of Pregnancy The third trimester is from week 28 through week 40 (months 7 through 9). The third trimester is a time when the unborn baby (fetus) is growing rapidly. At the end of the ninth month, the fetus is about 20 inches in length and weighs 6-10 pounds. Body changes during your third trimester Your body will continue to go through many changes during pregnancy. The changes vary from woman to woman. During the third trimester:  Your weight will continue to increase. You can expect to gain 25-35 pounds (11-16 kg) by the end of the pregnancy.  You may begin to get stretch marks on your hips, abdomen, and breasts.  You may urinate more  often because the fetus is moving lower into your pelvis and pressing on your bladder.  You may develop or continue to have heartburn. This is caused by increased hormones that slow down muscles in the digestive tract.  You may develop or continue to have constipation because increased hormones slow digestion and cause the muscles that push waste through your intestines to relax.  You may develop hemorrhoids. These are swollen veins (varicose veins) in the rectum that can itch  or be painful.  You may develop swollen, bulging veins (varicose veins) in your legs.  You may have increased body aches in the pelvis, back, or thighs. This is due to weight gain and increased hormones that are relaxing your joints.  You may have changes in your hair. These can include thickening of your hair, rapid growth, and changes in texture. Some women also have hair loss during or after pregnancy, or hair that feels dry or thin. Your hair will most likely return to normal after your baby is born.  Your breasts will continue to grow and they will continue to become tender. A yellow fluid (colostrum) may leak from your breasts. This is the first milk you are producing for your baby.  Your belly button may stick out.  You may notice more swelling in your hands, face, or ankles.  You may have increased tingling or numbness in your hands, arms, and legs. The skin on your belly may also feel numb.  You may feel short of breath because of your expanding uterus.  You may have more problems sleeping. This can be caused by the size of your belly, increased need to urinate, and an increase in your body's metabolism.  You may notice the fetus "dropping," or moving lower in your abdomen (lightening).  You may have increased vaginal discharge.  You may notice your joints feel loose and you may have pain around your pelvic bone.  What to expect at prenatal visits You will have prenatal exams every 2 weeks until week 36. Then you will have weekly prenatal exams. During a routine prenatal visit:  You will be weighed to make sure you and the baby are growing normally.  Your blood pressure will be taken.  Your abdomen will be measured to track your baby's growth.  The fetal heartbeat will be listened to.  Any test results from the previous visit will be discussed.  You may have a cervical check near your due date to see if your cervix has softened or thinned (effaced).  You will be  tested for Group B streptococcus. This happens between 35 and 37 weeks.  Your health care provider may ask you:  What your birth plan is.  How you are feeling.  If you are feeling the baby move.  If you have had any abnormal symptoms, such as leaking fluid, bleeding, severe headaches, or abdominal cramping.  If you are using any tobacco products, including cigarettes, chewing tobacco, and electronic cigarettes.  If you have any questions.  Other tests or screenings that may be performed during your third trimester include:  Blood tests that check for low iron levels (anemia).  Fetal testing to check the health, activity level, and growth of the fetus. Testing is done if you have certain medical conditions or if there are problems during the pregnancy.  Nonstress test (NST). This test checks the health of your baby to make sure there are no signs of problems, such as the baby not getting enough oxygen. During   this test, a belt is placed around your belly. The baby is made to move, and its heart rate is monitored during movement.  What is false labor? False labor is a condition in which you feel small, irregular tightenings of the muscles in the womb (contractions) that usually go away with rest, changing position, or drinking water. These are called Braxton Hicks contractions. Contractions may last for hours, days, or even weeks before true labor sets in. If contractions come at regular intervals, become more frequent, increase in intensity, or become painful, you should see your health care provider. What are the signs of labor?  Abdominal cramps.  Regular contractions that start at 10 minutes apart and become stronger and more frequent with time.  Contractions that start on the top of the uterus and spread down to the lower abdomen and back.  Increased pelvic pressure and dull back pain.  A watery or bloody mucus discharge that comes from the vagina.  Leaking of  amniotic fluid. This is also known as your "water breaking." It could be a slow trickle or a gush. Let your health care provider know if it has a color or strange odor. If you have any of these signs, call your health care provider right away, even if it is before your due date. Follow these instructions at home: Medicines  Follow your health care provider's instructions regarding medicine use. Specific medicines may be either safe or unsafe to take during pregnancy.  Take a prenatal vitamin that contains at least 600 micrograms (mcg) of folic acid.  If you develop constipation, try taking a stool softener if your health care provider approves. Eating and drinking  Eat a balanced diet that includes fresh fruits and vegetables, whole grains, good sources of protein such as meat, eggs, or tofu, and low-fat dairy. Your health care provider will help you determine the amount of weight gain that is right for you.  Avoid raw meat and uncooked cheese. These carry germs that can cause birth defects in the baby.  If you have low calcium intake from food, talk to your health care provider about whether you should take a daily calcium supplement.  Eat four or five small meals rather than three large meals a day.  Limit foods that are high in fat and processed sugars, such as fried and sweet foods.  To prevent constipation: ? Drink enough fluid to keep your urine clear or pale yellow. ? Eat foods that are high in fiber, such as fresh fruits and vegetables, whole grains, and beans. Activity  Exercise only as directed by your health care provider. Most women can continue their usual exercise routine during pregnancy. Try to exercise for 30 minutes at least 5 days a week. Stop exercising if you experience uterine contractions.  Avoid heavy lifting.  Do not exercise in extreme heat or humidity, or at high altitudes.  Wear low-heel, comfortable shoes.  Practice good posture.  You may continue to  have sex unless your health care provider tells you otherwise. Relieving pain and discomfort  Take frequent breaks and rest with your legs elevated if you have leg cramps or low back pain.  Take warm sitz baths to soothe any pain or discomfort caused by hemorrhoids. Use hemorrhoid cream if your health care provider approves.  Wear a good support bra to prevent discomfort from breast tenderness.  If you develop varicose veins: ? Wear support pantyhose or compression stockings as told by your healthcare provider. ? Elevate your feet  for 15 minutes, 3-4 times a day. Prenatal care  Write down your questions. Take them to your prenatal visits.  Keep all your prenatal visits as told by your health care provider. This is important. Safety  Wear your seat belt at all times when driving.  Make a list of emergency phone numbers, including numbers for family, friends, the hospital, and police and fire departments. General instructions  Avoid cat litter boxes and soil used by cats. These carry germs that can cause birth defects in the baby. If you have a cat, ask someone to clean the litter box for you.  Do not travel far distances unless it is absolutely necessary and only with the approval of your health care provider.  Do not use hot tubs, steam rooms, or saunas.  Do not drink alcohol.  Do not use any products that contain nicotine or tobacco, such as cigarettes and e-cigarettes. If you need help quitting, ask your health care provider.  Do not use any medicinal herbs or unprescribed drugs. These chemicals affect the formation and growth of the baby.  Do not douche or use tampons or scented sanitary pads.  Do not cross your legs for long periods of time.  To prepare for the arrival of your baby: ? Take prenatal classes to understand, practice, and ask questions about labor and delivery. ? Make a trial run to the hospital. ? Visit the hospital and tour the maternity area. ? Arrange  for maternity or paternity leave through employers. ? Arrange for family and friends to take care of pets while you are in the hospital. ? Purchase a rear-facing car seat and make sure you know how to install it in your car. ? Pack your hospital bag. ? Prepare the baby's nursery. Make sure to remove all pillows and stuffed animals from the baby's crib to prevent suffocation.  Visit your dentist if you have not gone during your pregnancy. Use a soft toothbrush to brush your teeth and be gentle when you floss. Contact a health care provider if:  You are unsure if you are in labor or if your water has broken.  You become dizzy.  You have mild pelvic cramps, pelvic pressure, or nagging pain in your abdominal area.  You have lower back pain.  You have persistent nausea, vomiting, or diarrhea.  You have an unusual or bad smelling vaginal discharge.  You have pain when you urinate. Get help right away if:  Your water breaks before 37 weeks.  You have regular contractions less than 5 minutes apart before 37 weeks.  You have a fever.  You are leaking fluid from your vagina.  You have spotting or bleeding from your vagina.  You have severe abdominal pain or cramping.  You have rapid weight loss or weight gain.  You have shortness of breath with chest pain.  You notice sudden or extreme swelling of your face, hands, ankles, feet, or legs.  Your baby makes fewer than 10 movements in 2 hours.  You have severe headaches that do not go away when you take medicine.  You have vision changes. Summary  The third trimester is from week 28 through week 40, months 7 through 9. The third trimester is a time when the unborn baby (fetus) is growing rapidly.  During the third trimester, your discomfort may increase as you and your baby continue to gain weight. You may have abdominal, leg, and back pain, sleeping problems, and an increased need to urinate.  During  the third trimester your  breasts will keep growing and they will continue to become tender. A yellow fluid (colostrum) may leak from your breasts. This is the first milk you are producing for your baby.  False labor is a condition in which you feel small, irregular tightenings of the muscles in the womb (contractions) that eventually go away. These are called Braxton Hicks contractions. Contractions may last for hours, days, or even weeks before true labor sets in.  Signs of labor can include: abdominal cramps; regular contractions that start at 10 minutes apart and become stronger and more frequent with time; watery or bloody mucus discharge that comes from the vagina; increased pelvic pressure and dull back pain; and leaking of amniotic fluid. This information is not intended to replace advice given to you by your health care provider. Make sure you discuss any questions you have with your health care provider. Document Released: 09/21/2001 Document Revised: 03/04/2016 Document Reviewed: 11/28/2012 Elsevier Interactive Patient Education  2017 [employer].   Breastfeeding Deciding to breastfeed is one of the best choices you can make for you and your baby. A change in hormones during pregnancy causes your breast tissue to grow and increases the number and size of your milk ducts. These hormones also allow proteins, sugars, and fats from your blood supply to make breast milk in your milk-producing glands. Hormones prevent breast milk from being released before your baby is born as well as prompt milk flow after birth. Once breastfeeding has begun, thoughts of your baby, as well as his or her sucking or crying, can stimulate the release of milk from your milk-producing glands. Benefits of breastfeeding For Your Baby  Your first milk (colostrum) helps your baby's digestive system function better.  There are antibodies in your milk that help your baby fight off infections.  Your baby has a lower incidence of asthma,  allergies, and sudden infant death syndrome.  The nutrients in breast milk are better for your baby than infant formulas and are designed uniquely for your baby's needs.  Breast milk improves your baby's brain development.  Your baby is less likely to develop other conditions, such as childhood obesity, asthma, or type 2 diabetes mellitus.  For You  Breastfeeding helps to create a very special bond between you and your baby.  Breastfeeding is convenient. Breast milk is always available at the correct temperature and costs nothing.  Breastfeeding helps to burn calories and helps you lose the weight gained during pregnancy.  Breastfeeding makes your uterus contract to its prepregnancy size faster and slows bleeding (lochia) after you give birth.  Breastfeeding helps to lower your risk of developing type 2 diabetes mellitus, osteoporosis, and breast or ovarian cancer later in life.  Signs that your baby is hungry Early Signs of Hunger  Increased alertness or activity.  Stretching.  Movement of the head from side to side.  Movement of the head and opening of the mouth when the corner of the mouth or cheek is stroked (rooting).  Increased sucking sounds, smacking lips, cooing, sighing, or squeaking.  Hand-to-mouth movements.  Increased sucking of fingers or hands.  Late Signs of Hunger  Fussing.  Intermittent crying.  Extreme Signs of Hunger Signs of extreme hunger will require calming and consoling before your baby will be able to breastfeed successfully. Do not wait for the following signs of extreme hunger to occur before you initiate breastfeeding:  Restlessness.  A loud, strong cry.  Screaming.  Breastfeeding basics Breastfeeding Initiation  Find a comfortable place to sit or lie down, with your neck and back well supported.  Place a pillow or rolled up blanket under your baby to bring him or her to the level of your breast (if you are seated). Nursing pillows  are specially designed to help support your arms and your baby while you breastfeed.  Make sure that your baby's abdomen is facing your abdomen.  Gently massage your breast. With your fingertips, massage from your chest wall toward your nipple in a circular motion. This encourages milk flow. You may need to continue this action during the feeding if your milk flows slowly.  Support your breast with 4 fingers underneath and your thumb above your nipple. Make sure your fingers are well away from your nipple and your baby's mouth.  Stroke your baby's lips gently with your finger or nipple.  When your baby's mouth is open wide enough, quickly bring your baby to your breast, placing your entire nipple and as much of the colored area around your nipple (areola) as possible into your baby's mouth. ? More areola should be visible above your baby's upper lip than below the lower lip. ? Your baby's tongue should be between his or her lower gum and your breast.  Ensure that your baby's mouth is correctly positioned around your nipple (latched). Your baby's lips should create a seal on your breast and be turned out (everted).  It is common for your baby to suck about 2-3 minutes in order to start the flow of breast milk.  Latching Teaching your baby how to latch on to your breast properly is very important. An improper latch can cause nipple pain and decreased milk supply for you and poor weight gain in your baby. Also, if your baby is not latched onto your nipple properly, he or she may swallow some air during feeding. This can make your baby fussy. Burping your baby when you switch breasts during the feeding can help to get rid of the air. However, teaching your baby to latch on properly is still the best way to prevent fussiness from swallowing air while breastfeeding. Signs that your baby has successfully latched on to your nipple:  Silent tugging or silent sucking, without causing you  pain.  Swallowing heard between every 3-4 sucks.  Muscle movement above and in front of his or her ears while sucking.  Signs that your baby has not successfully latched on to nipple:  Sucking sounds or smacking sounds from your baby while breastfeeding.  Nipple pain.  If you think your baby has not latched on correctly, slip your finger into the corner of your baby's mouth to break the suction and place it between your baby's gums. Attempt breastfeeding initiation again. Signs of Successful Breastfeeding Signs from your baby:  A gradual decrease in the number of sucks or complete cessation of sucking.  Falling asleep.  Relaxation of his or her body.  Retention of a small amount of milk in his or her mouth.  Letting go of your breast by himself or herself.  Signs from you:  Breasts that have increased in firmness, weight, and size 1-3 hours after feeding.  Breasts that are softer immediately after breastfeeding.  Increased milk volume, as well as a change in milk consistency and color by the fifth day of breastfeeding.  Nipples that are not sore, cracked, or bleeding.  Signs That Your Pecola Leisure is Getting Enough Milk  Wetting at least 1-2 diapers during the first 24  hours after birth.  Wetting at least 5-6 diapers every 24 hours for the first week after birth. The urine should be clear or pale yellow by 5 days after birth.  Wetting 6-8 diapers every 24 hours as your baby continues to grow and develop.  At least 3 stools in a 24-hour period by age 769 days. The stool should be soft and yellow.  At least 3 stools in a 24-hour period by age 6 days. The stool should be seedy and yellow.  No loss of weight greater than 10% of birth weight during the first 63 days of age.  Average weight gain of 4-7 ounces (113-198 g) per week after age 43 days.  Consistent daily weight gain by age 769 days, without weight loss after the age of 2 weeks.  After a feeding, your baby may spit up a  small amount. This is common. Breastfeeding frequency and duration Frequent feeding will help you make more milk and can prevent sore nipples and breast engorgement. Breastfeed when you feel the need to reduce the fullness of your breasts or when your baby shows signs of hunger. This is called "breastfeeding on demand." Avoid introducing a pacifier to your baby while you are working to establish breastfeeding (the first 4-6 weeks after your baby is born). After this time you may choose to use a pacifier. Research has shown that pacifier use during the first year of a baby's life decreases the risk of sudden infant death syndrome (SIDS). Allow your baby to feed on each breast as long as he or she wants. Breastfeed until your baby is finished feeding. When your baby unlatches or falls asleep while feeding from the first breast, offer the second breast. Because newborns are often sleepy in the first few weeks of life, you may need to awaken your baby to get him or her to feed. Breastfeeding times will vary from baby to baby. However, the following rules can serve as a guide to help you ensure that your baby is properly fed:  Newborns (babies 60 weeks of age or younger) may breastfeed every 1-3 hours.  Newborns should not go longer than 3 hours during the day or 5 hours during the night without breastfeeding.  You should breastfeed your baby a minimum of 8 times in a 24-hour period until you begin to introduce solid foods to your baby at around 32 months of age.  Breast milk pumping Pumping and storing breast milk allows you to ensure that your baby is exclusively fed your breast milk, even at times when you are unable to breastfeed. This is especially important if you are going back to work while you are still breastfeeding or when you are not able to be present during feedings. Your lactation consultant can give you guidelines on how long it is safe to store breast milk. A breast pump is a machine that  allows you to pump milk from your breast into a sterile bottle. The pumped breast milk can then be stored in a refrigerator or freezer. Some breast pumps are operated by hand, while others use electricity. Ask your lactation consultant which type will work best for you. Breast pumps can be purchased, but some hospitals and breastfeeding support groups lease breast pumps on a monthly basis. A lactation consultant can teach you how to hand express breast milk, if you prefer not to use a pump. Caring for your breasts while you breastfeed Nipples can become dry, cracked, and sore while breastfeeding. The following  recommendations can help keep your breasts moisturized and healthy:  Avoid using soap on your nipples.  Wear a supportive bra. Although not required, special nursing bras and tank tops are designed to allow access to your breasts for breastfeeding without taking off your entire bra or top. Avoid wearing underwire-style bras or extremely tight bras.  Air dry your nipples for 3-784minutes after each feeding.  Use only cotton bra pads to absorb leaked breast milk. Leaking of breast milk between feedings is normal.  Use lanolin on your nipples after breastfeeding. Lanolin helps to maintain your skin's normal moisture barrier. If you use pure lanolin, you do not need to wash it off before feeding your baby again. Pure lanolin is not toxic to your baby. You may also hand express a few drops of breast milk and gently massage that milk into your nipples and allow the milk to air dry.  In the first few weeks after giving birth, some women experience extremely full breasts (engorgement). Engorgement can make your breasts feel heavy, warm, and tender to the touch. Engorgement peaks within 3-5 days after you give birth. The following recommendations can help ease engorgement:  Completely empty your breasts while breastfeeding or pumping. You may want to start by applying warm, moist heat (in the shower or  with warm water-soaked hand towels) just before feeding or pumping. This increases circulation and helps the milk flow. If your baby does not completely empty your breasts while breastfeeding, pump any extra milk after he or she is finished.  Wear a snug bra (nursing or regular) or tank top for 1-2 days to signal your body to slightly decrease milk production.  Apply ice packs to your breasts, unless this is too uncomfortable for you.  Make sure that your baby is latched on and positioned properly while breastfeeding.  If engorgement persists after 48 hours of following these recommendations, contact your health care provider or a Advertising copywriterlactation consultant. Overall health care recommendations while breastfeeding  Eat healthy foods. Alternate between meals and snacks, eating 3 of each per day. Because what you eat affects your breast milk, some of the foods may make your baby more irritable than usual. Avoid eating these foods if you are sure that they are negatively affecting your baby.  Drink milk, fruit juice, and water to satisfy your thirst (about 10 glasses a day).  Rest often, relax, and continue to take your prenatal vitamins to prevent fatigue, stress, and anemia.  Continue breast self-awareness checks.  Avoid chewing and smoking tobacco. Chemicals from cigarettes that pass into breast milk and exposure to secondhand smoke may harm your baby.  Avoid alcohol and drug use, including marijuana. Some medicines that may be harmful to your baby can pass through breast milk. It is important to ask your health care provider before taking any medicine, including all over-the-counter and prescription medicine as well as vitamin and herbal supplements. It is possible to become pregnant while breastfeeding. If birth control is desired, ask your health care provider about options that will be safe for your baby. Contact a health care provider if:  You feel like you want to stop breastfeeding or have  become frustrated with breastfeeding.  You have painful breasts or nipples.  Your nipples are cracked or bleeding.  Your breasts are red, tender, or warm.  You have a swollen area on either breast.  You have a fever or chills.  You have nausea or vomiting.  You have drainage other than breast milk from  your nipples.  Your breasts do not become full before feedings by the fifth day after you give birth.  You feel sad and depressed.  Your baby is too sleepy to eat well.  Your baby is having trouble sleeping.  Your baby is wetting less than 3 diapers in a 24-hour period.  Your baby has less than 3 stools in a 24-hour period.  Your baby's skin or the white part of his or her eyes becomes yellow.  Your baby is not gaining weight by 205 days of age. Get help right away if:  Your baby is overly tired (lethargic) and does not want to wake up and feed.  Your baby develops an unexplained fever. This information is not intended to replace advice given to you by your health care provider. Make sure you discuss any questions you have with your health care provider. Document Released: 09/27/2005 Document Revised: 03/10/2016 Document Reviewed: 03/21/2013 Elsevier Interactive Patient Education  2017 [employer]

## 2017-07-20 NOTE — Progress Notes (Signed)
tdap given

## 2017-07-21 LAB — ANTIBODY SCREEN: Antibody Screen: NEGATIVE

## 2017-07-21 LAB — GLUCOSE TOLERANCE, 2 HOURS W/ 1HR
GLUCOSE, 1 HOUR: 145 mg/dL (ref 65–179)
Glucose, 2 hour: 123 mg/dL (ref 65–152)
Glucose, Fasting: 82 mg/dL (ref 65–91)

## 2017-07-21 LAB — RPR: RPR Ser Ql: NONREACTIVE

## 2017-07-21 LAB — HIV ANTIBODY (ROUTINE TESTING W REFLEX): HIV Screen 4th Generation wRfx: NONREACTIVE

## 2017-08-04 ENCOUNTER — Ambulatory Visit (INDEPENDENT_AMBULATORY_CARE_PROVIDER_SITE_OTHER): Payer: 59 | Admitting: Obstetrics and Gynecology

## 2017-08-04 VITALS — BP 99/67 | HR 111 | Wt 200.2 lb

## 2017-08-04 DIAGNOSIS — Z6791 Unspecified blood type, Rh negative: Secondary | ICD-10-CM

## 2017-08-04 DIAGNOSIS — Z3480 Encounter for supervision of other normal pregnancy, unspecified trimester: Secondary | ICD-10-CM

## 2017-08-04 DIAGNOSIS — O09891 Supervision of other high risk pregnancies, first trimester: Secondary | ICD-10-CM

## 2017-08-04 DIAGNOSIS — O26891 Other specified pregnancy related conditions, first trimester: Secondary | ICD-10-CM

## 2017-08-04 DIAGNOSIS — O43193 Other malformation of placenta, third trimester: Secondary | ICD-10-CM

## 2017-08-04 NOTE — Progress Notes (Signed)
Prenatal Visit Note Date: 08/04/2017 Clinic: [facility]  Subjective:  Kathy Aguilar is a 31 y.o. G3P1011 at 3235w6d being seen today for ongoing prenatal care.  She is currently monitored for the following issues for this low-risk pregnancy and has Infertility, female, secondary; Supervision of other normal pregnancy, antepartum; Rh negative state in antepartum period, first trimester; Family history of breast cancer; Cervical dysplasia; and Placenta succenturiata on her problem list.  Patient reports no complaints.   Contractions: Not present. Vag. Bleeding: None.  Movement: Present. Denies leaking of fluid.   The following portions of the patient's history were reviewed and updated as appropriate: allergies, current medications, past family history, past medical history, past social history, past surgical history and problem list. Problem list updated.  Objective:   Vitals:   08/04/17 0933  BP: 99/67  Pulse: (!) 111  Weight: 200 lb 3.2 oz (90.8 kg)    Fetal Status: Fetal Heart Rate (bpm): 155 Fundal Height: 31 cm Movement: Present     General:  Alert, oriented and cooperative. Patient is in no acute distress.  Skin: Skin is warm and dry. No rash noted.   Cardiovascular: Normal heart rate noted  Respiratory: Normal respiratory effort, no problems with respiration noted  Abdomen: Soft, gravid, appropriate for gestational age. Pain/Pressure: Absent     Pelvic:  Cervical exam deferred        Extremities: Normal range of motion.  Edema: Trace  Mental Status: Normal mood and affect. Normal behavior. Normal judgment and thought content.   Urinalysis:      Assessment and Plan:  Pregnancy: G3P1011 at 1635w6d  1. Supervision of other normal pregnancy, antepartum Routine care. Patch. PPI working well  2. Rh negative state in antepartum period, first trimester S/p rhogam already.  3. Placenta succenturiata in third trimester F/u at delivery.   Preterm labor  symptoms and general obstetric precautions including but not limited to vaginal bleeding, contractions, leaking of fluid and fetal movement were reviewed in detail with the patient. Please refer to After Visit Summary for other counseling recommendations.  Return in about 2 weeks (around 08/18/2017) for 2-3wk rob.   [place] BingPickens, Sherrica Niehaus, MD

## 2017-08-16 ENCOUNTER — Inpatient Hospital Stay (HOSPITAL_COMMUNITY)
Admission: AD | Admit: 2017-08-16 | Discharge: 2017-08-16 | Disposition: A | Discharge status: Home / Self Care | Payer: 59 | Source: Ambulatory Visit | Attending: Obstetrics & Gynecology | Admitting: Obstetrics & Gynecology

## 2017-08-16 ENCOUNTER — Encounter (HOSPITAL_COMMUNITY): Payer: Self-pay

## 2017-08-16 DIAGNOSIS — Z803 Family history of malignant neoplasm of breast: Secondary | ICD-10-CM | POA: Diagnosis not present

## 2017-08-16 DIAGNOSIS — Z3480 Encounter for supervision of other normal pregnancy, unspecified trimester: Secondary | ICD-10-CM

## 2017-08-16 DIAGNOSIS — O26893 Other specified pregnancy related conditions, third trimester: Secondary | ICD-10-CM | POA: Diagnosis not present

## 2017-08-16 DIAGNOSIS — Z3A32 32 weeks gestation of pregnancy: Secondary | ICD-10-CM | POA: Diagnosis not present

## 2017-08-16 DIAGNOSIS — R0602 Shortness of breath: Secondary | ICD-10-CM | POA: Diagnosis present

## 2017-08-16 DIAGNOSIS — R0789 Other chest pain: Secondary | ICD-10-CM | POA: Diagnosis not present

## 2017-08-16 HISTORY — DX: Unspecified asthma, uncomplicated: J45.909

## 2017-08-16 LAB — COMPREHENSIVE METABOLIC PANEL
ALT: 8 U/L — ABNORMAL LOW (ref 14–54)
AST: 17 U/L (ref 15–41)
Albumin: 2.9 g/dL — ABNORMAL LOW (ref 3.5–5.0)
Alkaline Phosphatase: 113 U/L (ref 38–126)
Anion gap: 7 (ref 5–15)
BUN: 7 mg/dL (ref 6–20)
CHLORIDE: 105 mmol/L (ref 101–111)
CO2: 24 mmol/L (ref 22–32)
Calcium: 8.4 mg/dL — ABNORMAL LOW (ref 8.9–10.3)
Creatinine, Ser: 0.58 mg/dL (ref 0.44–1.00)
Glucose, Bld: 99 mg/dL (ref 65–99)
POTASSIUM: 3.2 mmol/L — AB (ref 3.5–5.1)
Sodium: 136 mmol/L (ref 135–145)
Total Bilirubin: 0.2 mg/dL — ABNORMAL LOW (ref 0.3–1.2)
Total Protein: 6.8 g/dL (ref 6.5–8.1)

## 2017-08-16 LAB — URINALYSIS, ROUTINE W REFLEX MICROSCOPIC
BILIRUBIN URINE: NEGATIVE
Glucose, UA: NEGATIVE mg/dL
Hgb urine dipstick: NEGATIVE
Ketones, ur: 5 mg/dL — AB
Nitrite: NEGATIVE
Protein, ur: 100 mg/dL — AB
SPECIFIC GRAVITY, URINE: 1.024 (ref 1.005–1.030)
pH: 6 (ref 5.0–8.0)

## 2017-08-16 LAB — CBC
HEMATOCRIT: 32 % — AB (ref 36.0–46.0)
HEMOGLOBIN: 10.9 g/dL — AB (ref 12.0–15.0)
MCH: 29.5 pg (ref 26.0–34.0)
MCHC: 34.1 g/dL (ref 30.0–36.0)
MCV: 86.7 fL (ref 78.0–100.0)
Platelets: 275 10*3/uL (ref 150–400)
RBC: 3.69 MIL/uL — AB (ref 3.87–5.11)
RDW: 14.1 % (ref 11.5–15.5)
WBC: 11.7 10*3/uL — AB (ref 4.0–10.5)

## 2017-08-16 LAB — PROTEIN / CREATININE RATIO, URINE
CREATININE, URINE: 227 mg/dL
Protein Creatinine Ratio: 0.15 mg/mg{Cre} (ref 0.00–0.15)
TOTAL PROTEIN, URINE: 35 mg/dL

## 2017-08-16 NOTE — MAU Provider Note (Signed)
Chief Complaint:  Shortness of Breath   None     HPI: Kathy Aguilar is a 31 y.o. G3P1011 at 6454w4d who presents to MAU reporting shortness of breath that started earlier today.  Patient states that she was just sitting down and had shortness of breath.  Denies anything that worsens her shortness of breath.  States that she feels a little better now.  Has some chest pain/pressure localized to the center lower part of her chest.  Also endorsing face and hand tingling that occurred shortly after shortness of breath but was brief.  Having some fuzzy vision.  Patient denies any heart or lung disease history.  States that when she was younger she had asthma but that has resolved.  Denies any clotting history.  Patient states she has had no known sick contacts and denies any fever.  Denies cough.  Patient denies leg pain or swelling.  She has current headache that is rated as a 3.  She denies any wheezing, n/v/d.  Denies contractions, leakage of fluid, vaginal discharge, or vaginal bleeding. Good fetal movement.   Pregnancy Course:  [facility]  Past Medical History: Past Medical History:  Diagnosis Date  . Appendicitis   . Asthma    [place]  . Family history of adverse reaction to anesthesia    mother has vomiting after anesthesia    Past obstetric history: OB History  Gravida Para Term Preterm AB Living  3 1 1  0 1 1  SAB TAB Ectopic Multiple Live Births  1 0 0 0 1    # Outcome Date GA Lbr Len/2nd Weight Sex Delivery Anes PTL Lv  3 Current           2 SAB 10/2016          1 Term 10/18/05   3.175 kg (7 lb) F Vag-Spont  N LIV      Past Surgical History: Past Surgical History:  Procedure Laterality Date  . SALPINGECTOMY Right      Family History: Family History  Problem Relation Age of Onset  . Breast cancer Mother [id number]  . Osteoarthritis Maternal Grandmother   . Hypertension Maternal Grandmother   . Cancer Maternal Grandmother       blood cancer?  . Kidney disease Maternal Grandmother   . Cancer Maternal Grandfather 370       prostate cancer  . Kidney disease Paternal Grandmother     Social History: Social History   Tobacco Use  . Smoking status: Never Smoker  . Smokeless tobacco: Never Used  Substance Use Topics  . Alcohol use: No    Alcohol/week: 0.6 oz    Types: 1 Standard drinks or equivalent per week  . Drug use: No    Allergies: No Known Allergies  Meds:  Medications Prior to Admission  Medication Sig Dispense Refill Last Dose  . Doxylamine-Pyridoxine ER (BONJESTA) 20-20 MG TBCR Take 1 tablet by mouth at bedtime. Add 1 tab midday prn 30 tablet 6 Taking  . omeprazole (PRILOSEC OTC) 20 MG tablet Take 1 tablet (20 mg total) by mouth daily. 30 tablet 3 Taking  . Prenatal Vit-Fe Fumarate-FA (PRENATAL VITAMIN PO) Take 1 tablet by mouth every morning.    Taking  . promethazine (PHENERGAN) 25 MG tablet Take 1 tablet (25 mg total) by mouth every 6 (six) hours as needed for nausea or vomiting. 30 tablet 0 Taking    I have reviewed patient's Past Medical Hx, Surgical  Hx, Family Hx, Social Hx, medications and allergies.   ROS:  All systems reviewed and are negative for acute change except as noted in the HPI.   Physical Exam   Patient Vitals for the past 24 hrs:  BP Temp Temp src Pulse Resp SpO2 Height Weight  08/16/17 1619 132/71 98.5 F (36.9 C) Oral (!) 105 18 97 % 5\' 7"  (1.702 m) 92.5 kg (204 lb)   Constitutional: Well-developed, well-nourished female in no acute distress.  HEENT: NCAT, o/p clear, EOMI Cardiovascular: tachycardia, normal rhythm, pulses intact.   Respiratory: normal rate and effort. No wheezes or crackles. GI: Abd soft, non-tender, gravid appropriate for gestational age.  MS: Extremities nontender, no edema, normal ROM MSK: No chest wall tenderness. Homan's sign negative, tace edema Neurologic: Alert and oriented x 4. No focal deficits. Strength and sensation intact.  Psych:  normal mood and affect    Labs: Results for orders placed or performed during the hospital encounter of 08/16/17 (from the past 24 hour(s))  Urinalysis, Routine w reflex microscopic     Status: Abnormal   Collection Time: 08/16/17  4:20 PM  Result Value Ref Range   Color, Urine YELLOW YELLOW   APPearance CLOUDY (A) CLEAR   Specific Gravity, Urine 1.024 1.005 - 1.030   pH 6.0 5.0 - 8.0   Glucose, UA NEGATIVE NEGATIVE mg/dL   Hgb urine dipstick NEGATIVE NEGATIVE   Bilirubin Urine NEGATIVE NEGATIVE   Ketones, ur 5 (A) NEGATIVE mg/dL   Protein, ur 914100 (A) NEGATIVE mg/dL   Nitrite NEGATIVE NEGATIVE   Leukocytes, UA SMALL (A) NEGATIVE   RBC / HPF 0-5 0 - 5 RBC/hpf   WBC, UA 6-30 0 - 5 WBC/hpf   Bacteria, UA MANY (A) NONE SEEN   Squamous Epithelial / LPF 0-5 (A) NONE SEEN   Mucus PRESENT   Protein / creatinine ratio, urine     Status: None   Collection Time: 08/16/17  4:20 PM  Result Value Ref Range   Creatinine, Urine 227.00 mg/dL   Total Protein, Urine 35 mg/dL   Protein Creatinine Ratio 0.15 0.00 - 0.15 mg/mg[Cre]  CBC     Status: Abnormal   Collection Time: 08/16/17  4:54 PM  Result Value Ref Range   WBC 11.7 (H) 4.0 - 10.5 K/uL   RBC 3.69 (L) 3.87 - 5.11 MIL/uL   Hemoglobin 10.9 (L) 12.0 - 15.0 g/dL   HCT 78.232.0 (L) 95.636.0 - 21.346.0 %   MCV 86.7 78.0 - 100.0 fL   MCH 29.5 26.0 - 34.0 pg   MCHC 34.1 30.0 - 36.0 g/dL   RDW 08.614.1 57.811.5 - 46.915.5 %   Platelets 275 150 - 400 K/uL  Comprehensive metabolic panel     Status: Abnormal   Collection Time: 08/16/17  4:54 PM  Result Value Ref Range   Sodium 136 135 - 145 mmol/L   Potassium 3.2 (L) 3.5 - 5.1 mmol/L   Chloride 105 101 - 111 mmol/L   CO2 24 22 - 32 mmol/L   Glucose, Bld 99 65 - 99 mg/dL   BUN 7 6 - 20 mg/dL   Creatinine, Ser 6.290.58 0.44 - 1.00 mg/dL   Calcium 8.4 (L) 8.9 - 10.3 mg/dL   Total Protein 6.8 6.5 - 8.1 g/dL   Albumin 2.9 (L) 3.5 - 5.0 g/dL   AST 17 15 - 41 U/L   ALT 8 (L) 14 - 54 U/L   Alkaline Phosphatase 113 38  - 126 U/L   Total  Bilirubin 0.2 (L) 0.3 - 1.2 mg/dL   GFR calc non Af Amer >60 >60 mL/min   GFR calc Af Amer >60 >60 mL/min   Anion gap 7 5 - 15    Imaging:  No results found.  MAU Course: Vitals and nursing notes reviewed -blood pressure is elevated above patient's usual baseline also tachycardic I have ordered labs and reviewed them Urine shows signs of dehydration Preeclamptic labs ordered due to some of her symptoms and elevated pressures: labs are unremarkable EKG with sinus tachycardia Wells criteria for PE score 1.5 = low risk Shared decision making happened and patient declined CXR at this time; lung exam was unremarkable  Treatments given in MAU: PO hydration with improvement in HR and uterine irritability   I personally reviewed the patient's NST today, found to be REACTIVE. 145 bpm, mod var, +accels, no decels (had one variable decel over monitoring). CTX: UI   MDM: Plan of care reviewed with patient, including labs and tests ordered and medical treatment.   Assessment: 1. Shortness of breath due to pregnancy in third trimester   2. Supervision of other normal pregnancy, antepartum   3. Discomfort in chest    Dyspnea of pregnancy  Plan: Discharge home in stable condition Return precautions and signs given Discussed need to increase hydration Preterm labor precautions and fetal kick counts reviewed Handout given Follow-up with OB provider on Thursday   Caryl Ada, DO [facility], [facility] 08/16/2017 4:32 PM

## 2017-08-16 NOTE — MAU Note (Signed)
Pt reports around 2:15 she began to feel like she couldn't catch her breath and later began feeling like her face and hands were tingling.

## 2017-08-16 NOTE — Discharge Instructions (Signed)
Shortness of Breath, Adult  Shortness of breath means you have trouble breathing. Your lungs are organs for breathing.  Follow these instructions at home:  Pay attention to any changes in your symptoms. Take these actions to help with your condition:  ? Do not smoke. Smoking can cause shortness of breath. If you need help to quit smoking, ask your doctor.  ? Avoid things that can make it harder to breathe, such as:  ? Mold.  ? Dust.  ? Air pollution.  ? Chemical smells.  ? Things that can cause allergy symptoms (allergens), if you have allergies.  ? Keep your living space clean and free of mold and dust.  ? Rest as needed. Slowly return to your usual activities.  ? Take over-the-counter and prescription medicines, including oxygen and inhaled medicines, only as told by your doctor.  ? Keep all follow-up visits as told by your doctor. This is important.  Contact a doctor if:  ? Your condition does not get better as soon as expected.  ? You have a hard time doing your normal activities, even after you rest.  ? You have new symptoms.  Get help right away if:  ? You have trouble breathing when you are resting.  ? You feel light-headed or you faint.  ? You have a cough that is not helped by medicines.  ? You cough up blood.  ? You have pain with breathing.  ? You have pain in your chest, arms, shoulders, or belly (abdomen).  ? You have a fever.  ? You cannot walk up stairs.  ? You cannot exercise the way you normally do.  This information is not intended to replace advice given to you by your health care provider. Make sure you discuss any questions you have with your health care provider.  Document Released: 03/15/2008 Document Revised: 10/14/2016 Document Reviewed: 10/14/2016  Elsevier Interactive Patient Education ? 2017 Elsevier Inc.

## 2017-08-18 ENCOUNTER — Ambulatory Visit (INDEPENDENT_AMBULATORY_CARE_PROVIDER_SITE_OTHER): Payer: 59 | Admitting: Obstetrics and Gynecology

## 2017-08-18 VITALS — BP 111/72 | HR 110 | Wt 204.5 lb

## 2017-08-18 DIAGNOSIS — Z3480 Encounter for supervision of other normal pregnancy, unspecified trimester: Secondary | ICD-10-CM

## 2017-08-18 DIAGNOSIS — Z3483 Encounter for supervision of other normal pregnancy, third trimester: Secondary | ICD-10-CM

## 2017-08-18 NOTE — Progress Notes (Signed)
Prenatal Visit Note Date: 08/18/2017 Clinic: [facility]  Subjective:  Kathy Aguilar is a 31 y.o. G3P1011 at 5060w6d being seen today for ongoing prenatal care.  She is currently monitored for the following issues for this low-risk pregnancy and has Infertility, female, secondary; Supervision of other normal pregnancy, antepartum; Rh negative state in antepartum period, first trimester; Family history of breast cancer; Cervical dysplasia; and Placenta succenturiata on their problem list.  Patient reports no complaints.   Contractions: Not present. Vag. Bleeding: None.  Movement: Present. Denies leaking of fluid.   The following portions of the patient's history were reviewed and updated as appropriate: allergies, current medications, past family history, past medical history, past social history, past surgical history and problem list. Problem list updated.  Objective:   Vitals:   08/18/17 0846  BP: 111/72  Pulse: (!) 110  Weight: 204 lb 8 oz (92.8 kg)    Fetal Status: Fetal Heart Rate (bpm): 152 Fundal Height: 32 cm Movement: Present  Presentation: Vertex  General:  Alert, oriented and cooperative. Patient is in no acute distress.  Skin: Skin is warm and dry. No rash noted.   Cardiovascular: Normal heart rate noted  Respiratory: Normal respiratory effort, no problems with respiration noted  Abdomen: Soft, gravid, appropriate for gestational age. Pain/Pressure: Absent     Pelvic:  Cervical exam deferred        Extremities: Normal range of motion.  Edema: Trace  Mental Status: Normal mood and affect. Normal behavior. Normal judgment and thought content.   Urinalysis:      Assessment and Plan:  Pregnancy: G3P1011 at 4860w6d   1. Supervision of other normal pregnancy, antepartum Routine care. Patch. PPI working well  2. Rh negative state in antepartum period, first trimester S/p rhogam already.  3. Placenta succenturiata in third trimester F/u at  delivery.   Preterm labor symptoms and general obstetric precautions including but not limited to vaginal bleeding, contractions, leaking of fluid and fetal movement were reviewed in detail with the patient. Please refer to After Visit Summary for other counseling recommendations.  Return in about 2 weeks (around 09/01/2017) for rob.   [place] BingPickens, Resa Rinks, MD

## 2017-08-31 ENCOUNTER — Ambulatory Visit (INDEPENDENT_AMBULATORY_CARE_PROVIDER_SITE_OTHER): Payer: 59 | Admitting: Obstetrics and Gynecology

## 2017-08-31 ENCOUNTER — Encounter: Payer: Self-pay | Admitting: Obstetrics and Gynecology

## 2017-08-31 VITALS — BP 120/79 | HR 102 | Wt 205.8 lb

## 2017-08-31 DIAGNOSIS — Z6791 Unspecified blood type, Rh negative: Secondary | ICD-10-CM

## 2017-08-31 DIAGNOSIS — Z3480 Encounter for supervision of other normal pregnancy, unspecified trimester: Secondary | ICD-10-CM

## 2017-08-31 DIAGNOSIS — O09893 Supervision of other high risk pregnancies, third trimester: Secondary | ICD-10-CM

## 2017-08-31 DIAGNOSIS — O26891 Other specified pregnancy related conditions, first trimester: Principal | ICD-10-CM

## 2017-08-31 DIAGNOSIS — Z3483 Encounter for supervision of other normal pregnancy, third trimester: Secondary | ICD-10-CM

## 2017-08-31 NOTE — Progress Notes (Signed)
   PRENATAL VISIT NOTE  Subjective:  Kathy Aguilar is a 31 y.o. G3P1011 at 7732w5d being seen today for ongoing prenatal care.  She is currently monitored for the following issues for this low-risk pregnancy and has Infertility, female, secondary; Supervision of other normal pregnancy, antepartum; Rh negative state in antepartum period, first trimester; Family history of breast cancer; Cervical dysplasia; and Placenta succenturiata on their problem list.  Patient reports no complaints.  Contractions: Irregular.  .  Movement: Present. Denies leaking of fluid.   The following portions of the patient's history were reviewed and updated as appropriate: allergies, current medications, past family history, past medical history, past social history, past surgical history and problem list. Problem list updated.  Objective:   Vitals:   08/31/17 1521  BP: 120/79  Pulse: (!) 102  Weight: 205 lb 12.8 oz (93.4 kg)    Fetal Status: Fetal Heart Rate (bpm): 140 Fundal Height: 35 cm Movement: Present     General:  Alert, oriented and cooperative. Patient is in no acute distress.  Skin: Skin is warm and dry. No rash noted.   Cardiovascular: Normal heart rate noted  Respiratory: Normal respiratory effort, no problems with respiration noted  Abdomen: Soft, gravid, appropriate for gestational age.  Pain/Pressure: Absent     Pelvic: Cervical exam deferred        Extremities: Normal range of motion.  Edema: Trace  Mental Status:  Normal mood and affect. Normal behavior. Normal judgment and thought content.   Assessment and Plan:  Pregnancy: G3P1011 at 6832w5d  1. Rh negative state in antepartum period, first trimester S/p rhogam  2. Supervision of other normal pregnancy, antepartum Patient is doing well without complaints Cultures next visit Patient desires to start weekly visits  Preterm labor symptoms and general obstetric precautions including but not limited to vaginal bleeding, contractions,  leaking of fluid and fetal movement were reviewed in detail with the patient. Please refer to After Visit Summary for other counseling recommendations.  Return in about 1 week (around 09/07/2017) for ROB.   Catalina AntiguaPeggy Kazoua Gossen, MD

## 2017-09-07 ENCOUNTER — Encounter: Payer: Self-pay | Admitting: Family Medicine

## 2017-09-07 ENCOUNTER — Ambulatory Visit (INDEPENDENT_AMBULATORY_CARE_PROVIDER_SITE_OTHER): Payer: 59 | Admitting: Family Medicine

## 2017-09-07 VITALS — BP 122/82 | HR 102 | Wt 207.4 lb

## 2017-09-07 DIAGNOSIS — Z6791 Unspecified blood type, Rh negative: Secondary | ICD-10-CM

## 2017-09-07 DIAGNOSIS — N979 Female infertility, unspecified: Secondary | ICD-10-CM

## 2017-09-07 DIAGNOSIS — O09893 Supervision of other high risk pregnancies, third trimester: Secondary | ICD-10-CM

## 2017-09-07 DIAGNOSIS — Z803 Family history of malignant neoplasm of breast: Secondary | ICD-10-CM

## 2017-09-07 DIAGNOSIS — O26891 Other specified pregnancy related conditions, first trimester: Secondary | ICD-10-CM

## 2017-09-07 DIAGNOSIS — Z3483 Encounter for supervision of other normal pregnancy, third trimester: Secondary | ICD-10-CM

## 2017-09-07 DIAGNOSIS — O43193 Other malformation of placenta, third trimester: Secondary | ICD-10-CM

## 2017-09-07 DIAGNOSIS — Z3480 Encounter for supervision of other normal pregnancy, unspecified trimester: Secondary | ICD-10-CM

## 2017-09-07 NOTE — Progress Notes (Signed)
   PRENATAL VISIT NOTE  Subjective:  Kathy Aguilar is a 31 y.o. G3P1011 at 6869w5d being seen today for ongoing prenatal care.  She is currently monitored for the following issues for this low-risk pregnancy and has Infertility, female, secondary; Supervision of other normal pregnancy, antepartum; Rh negative state in antepartum period, first trimester; Family history of breast cancer; Cervical dysplasia; and Placenta succenturiata on their problem list.  Patient reports backache- occasional, worse with prolonged driving.  Contractions: Irregular.  .  Movement: Present. Denies leaking of fluid.   The following portions of the patient's history were reviewed and updated as appropriate: allergies, current medications, past family history, past medical history, past social history, past surgical history and problem list. Problem list updated.  Objective:   Vitals:   09/07/17 1634  BP: 122/82  Pulse: (!) 102  Weight: 207 lb 6.4 oz (94.1 kg)    Fetal Status: Fetal Heart Rate (bpm): 136   Movement: Present     General:  Alert, oriented and cooperative. Patient is in no acute distress.  Skin: Skin is warm and dry. No rash noted.   Cardiovascular: Normal heart rate noted  Respiratory: Normal respiratory effort, no problems with respiration noted  Abdomen: Soft, gravid, appropriate for gestational age.  Pain/Pressure: Absent     Pelvic: Cervical exam deferred        Extremities: Normal range of motion.  Edema: Trace  Mental Status:  Normal mood and affect. Normal behavior. Normal judgment and thought content.   Assessment and Plan:  Pregnancy: G3P1011 at 1569w5d  1. Supervision of other normal pregnancy, antepartum UTD 36 week labs next visit Has swelling in legs, recommended elevation, rest, water intake and compression stocking  2. Rh negative state in antepartum period, first trimester Received rhogam   3. Placenta succenturiata in third trimester  4. Family history of breast  cancer  5. Infertility, female, secondary   Preterm labor symptoms and general obstetric precautions including but not limited to vaginal bleeding, contractions, leaking of fluid and fetal movement were reviewed in detail with the patient. Please refer to After Visit Summary for other counseling recommendations.  No Follow-up on file.   Federico FlakeKimberly Niles Joshaua Epple, MD

## 2017-09-14 ENCOUNTER — Ambulatory Visit (INDEPENDENT_AMBULATORY_CARE_PROVIDER_SITE_OTHER): Payer: 59 | Admitting: Student

## 2017-09-14 ENCOUNTER — Encounter: Payer: Self-pay | Admitting: Radiology

## 2017-09-14 VITALS — BP 130/81 | HR 98 | Wt 208.2 lb

## 2017-09-14 DIAGNOSIS — Z113 Encounter for screening for infections with a predominantly sexual mode of transmission: Secondary | ICD-10-CM | POA: Diagnosis not present

## 2017-09-14 DIAGNOSIS — Z3480 Encounter for supervision of other normal pregnancy, unspecified trimester: Secondary | ICD-10-CM

## 2017-09-14 DIAGNOSIS — Z3483 Encounter for supervision of other normal pregnancy, third trimester: Secondary | ICD-10-CM

## 2017-09-14 NOTE — Patient Instructions (Signed)
Group B Streptococcus Infection During Pregnancy Group B Streptococcus (GBS) is a type of bacteria (Streptococcus agalactiae) that is often found in healthy people, commonly in the rectum, vagina, and intestines. In people who are healthy and not pregnant, the bacteria rarely cause serious illness or complications. However, women who test positive for GBS during pregnancy can pass the bacteria to their baby during childbirth, which can cause serious infection in the baby after birth. Women with GBS may also have infections during their pregnancy or immediately after childbirth, such as such as urinary tract infections (UTIs) or infections of the uterus (uterine infections). Having GBS also increases a woman's risk of complications during pregnancy, such as early (preterm) labor or delivery, miscarriage, or stillbirth. Routine testing (screening) for GBS is recommended for all pregnant women. What increases the risk? You may have a higher risk for GBS infection during pregnancy if you had one during a past pregnancy. What are the signs or symptoms? In most cases, GBS infection does not cause symptoms in pregnant women. Signs and symptoms of a possible GBS-related infection may include:  Labor starting before the 37th week of pregnancy.  A UTI or bladder infection, which may cause: ? Fever. ? Pain or burning during urination. ? Frequent urination.  Fever during labor, along with: ? Bad-smelling discharge. ? Uterine tenderness. ? Rapid heartbeat in the mother, baby, or both.  Rare but serious symptoms of a possible GBS-related infection in women include:  Blood infection (septicemia). This may cause fever, chills, or confusion.  Lung infection (pneumonia). This may cause fever, chills, cough, rapid breathing, difficulty breathing, or chest pain.  Bone, joint, skin, or soft tissue infection.  How is this diagnosed? You may be screened for GBS between week 35 and week 37 of your pregnancy. If  you have symptoms of preterm labor, you may be screened earlier. This condition is diagnosed based on lab test results from:  A swab of fluid from the vagina and rectum.  A urine sample.  How is this treated? This condition is treated with antibiotic medicine. When you go into labor, or as soon as your water breaks (your membranes rupture), you will be given antibiotics through an IV tube. Antibiotics will continue until after you give birth. If you are having a cesarean delivery, you do not need antibiotics unless your membranes have already ruptured. Follow these instructions at home:  Take over-the-counter and prescription medicines only as told by your health care provider.  Take your antibiotic medicine as told by your health care provider. Do not stop taking the antibiotic even if you start to feel better.  Keep all pre-birth (prenatal) visits and follow-up visits as told by your health care provider. This is important. Contact a health care provider if:  You have pain or burning when you urinate.  You have to urinate frequently.  You have a fever or chills.  You develop a bad-smelling vaginal discharge. Get help right away if:  Your membranes rupture.  You go into labor.  You have severe pain in your abdomen.  You have difficulty breathing.  You have chest pain. This information is not intended to replace advice given to you by your health care provider. Make sure you discuss any questions you have with your health care provider. Document Released: 01/04/2008 Document Revised: 04/23/2016 Document Reviewed: 04/22/2016 Elsevier Interactive Patient Education  2018 Elsevier Inc.  

## 2017-09-14 NOTE — Progress Notes (Signed)
ROB GBS and GC/CT due today Pt desires Cx check today.

## 2017-09-15 LAB — GC/CHLAMYDIA PROBE AMP (~~LOC~~) NOT AT ARMC
CHLAMYDIA, DNA PROBE: NEGATIVE
NEISSERIA GONORRHEA: NEGATIVE

## 2017-09-15 LAB — OB RESULTS CONSOLE GC/CHLAMYDIA: GC PROBE AMP, GENITAL: NEGATIVE

## 2017-09-15 LAB — OB RESULTS CONSOLE GBS: GBS: NEGATIVE

## 2017-09-15 NOTE — Progress Notes (Signed)
   PRENATAL VISIT NOTE  Subjective:  Kathy Aguilar is a 31 y.o. G3P1011 at 6235w6d being seen today for ongoing prenatal care.  She is currently monitored for the following issues for this low-risk pregnancy and has Infertility, female, secondary; Supervision of other normal pregnancy, antepartum; Rh negative state in antepartum period, first trimester; Family history of breast cancer; Cervical dysplasia; and Placenta succenturiata on their problem list.  Patient reports no complaints.  Contractions: Not present. Vag. Bleeding: None.  Movement: Present. Denies leaking of fluid.   The following portions of the patient's history were reviewed and updated as appropriate: allergies, current medications, past family history, past medical history, past social history, past surgical history and problem list. Problem list updated.  Objective:   Vitals:   09/14/17 1514  BP: 130/81  Pulse: 98  Weight: 208 lb 3.2 oz (94.4 kg)    Fetal Status: Fetal Heart Rate (bpm): 158 Fundal Height: 37 cm Movement: Present     General:  Alert, oriented and cooperative. Patient is in no acute distress.  Skin: Skin is warm and dry. No rash noted.   Cardiovascular: Normal heart rate noted  Respiratory: Normal respiratory effort, no problems with respiration noted  Abdomen: Soft, gravid, appropriate for gestational age.  Pain/Pressure: Present     Pelvic: Cervical exam deferred        Extremities: Normal range of motion.  Edema: Mild pitting, slight indentation  Mental Status:  Normal mood and affect. Normal behavior. Normal judgment and thought content.   Assessment and Plan:  Pregnancy: G3P1011 at 5435w6d  1. Supervision of other normal pregnancy, antepartum Doing well; reviewed S/S of labor.  - Strep Gp B NAA - GC[facility]Chlamydia probe amp [facility]  Term labor symptoms and general obstetric precautions including but not limited to vaginal bleeding, contractions, leaking of fluid and fetal movement  were reviewed in detail with the patient. Please refer to After Visit Summary for other counseling recommendations.  Return in about 1 week (around 09/21/2017), or ROb.   Kathy Aguilar, CNM

## 2017-09-16 LAB — STREP GP B NAA: STREP GROUP B AG: NEGATIVE

## 2017-09-22 ENCOUNTER — Inpatient Hospital Stay (HOSPITAL_COMMUNITY)
Admission: AD | Admit: 2017-09-22 | Discharge: 2017-09-22 | Disposition: A | Discharge status: Home / Self Care | Payer: 59 | Source: Ambulatory Visit | Attending: Obstetrics and Gynecology | Admitting: Obstetrics and Gynecology

## 2017-09-22 ENCOUNTER — Ambulatory Visit (INDEPENDENT_AMBULATORY_CARE_PROVIDER_SITE_OTHER): Payer: 59 | Admitting: Obstetrics and Gynecology

## 2017-09-22 ENCOUNTER — Encounter (HOSPITAL_COMMUNITY): Payer: Self-pay

## 2017-09-22 VITALS — BP 118/82 | HR 100 | Wt 207.1 lb

## 2017-09-22 DIAGNOSIS — O09891 Supervision of other high risk pregnancies, first trimester: Secondary | ICD-10-CM

## 2017-09-22 DIAGNOSIS — O471 False labor at or after 37 completed weeks of gestation: Secondary | ICD-10-CM | POA: Diagnosis not present

## 2017-09-22 DIAGNOSIS — Z3A38 38 weeks gestation of pregnancy: Secondary | ICD-10-CM | POA: Diagnosis not present

## 2017-09-22 DIAGNOSIS — O43193 Other malformation of placenta, third trimester: Secondary | ICD-10-CM

## 2017-09-22 DIAGNOSIS — Z3480 Encounter for supervision of other normal pregnancy, unspecified trimester: Secondary | ICD-10-CM

## 2017-09-22 DIAGNOSIS — O26891 Other specified pregnancy related conditions, first trimester: Secondary | ICD-10-CM

## 2017-09-22 DIAGNOSIS — Z6791 Unspecified blood type, Rh negative: Secondary | ICD-10-CM

## 2017-09-22 DIAGNOSIS — O479 False labor, unspecified: Secondary | ICD-10-CM

## 2017-09-22 LAB — POCT FERN TEST: POCT FERN TEST: NEGATIVE

## 2017-09-22 NOTE — MAU Note (Signed)
Patient came in with contractions every 3-7 minutes. She lost her mucous plug 09-21-17. No bleeding. Mucous clear discharge.

## 2017-09-22 NOTE — Progress Notes (Signed)
Prenatal Visit Note Date: 09/22/2017 Clinic: [facility]  Subjective:  Zane HeraldHeather M Pickrel is a 31 y.o. G3P1011 at 2967w6d being seen today for ongoing prenatal care.  She is currently monitored for the following issues for this low-risk pregnancy and has Infertility, female, secondary; Supervision of other normal pregnancy, antepartum; Rh negative state in antepartum period, first trimester; Family history of breast cancer; Cervical dysplasia; and Placenta succenturiata on their problem list.  Patient reports no complaints.  UCs died down from MAU eval early this morning Contractions: Irregular. Vag. Bleeding: None.  Movement: Present. Denies leaking of fluid.   The following portions of the patient's history were reviewed and updated as appropriate: allergies, current medications, past family history, past medical history, past social history, past surgical history and problem list. Problem list updated.  Objective:   Vitals:   09/22/17 1134  BP: 118/82  Pulse: 100  Weight: 207 lb 1.6 oz (93.9 kg)    Fetal Status: Fetal Heart Rate (bpm): 159 Fundal Height: 38 cm Movement: Present     General:  Alert, oriented and cooperative. Patient is in no acute distress.  Skin: Skin is warm and dry. No rash noted.   Cardiovascular: Normal heart rate noted  Respiratory: Normal respiratory effort, no problems with respiration noted  Abdomen: Soft, gravid, appropriate for gestational age. Pain/Pressure: Present     Pelvic:  Cervical exam deferred        Extremities: Normal range of motion.     Mental Status: Normal mood and affect. Normal behavior. Normal judgment and thought content.   Urinalysis:      Assessment and Plan:  Pregnancy: G3P1011 at 667w6d  1. Placenta succenturiata in third trimester F/u at delivery  2. Rh negative state in antepartum period, first trimester Rhogam pp prn  3. Supervision of other normal pregnancy, antepartum Routine care. Bedside u/s shows  ceph, subj normal AF  Term labor symptoms and general obstetric precautions including but not limited to vaginal bleeding, contractions, leaking of fluid and fetal movement were reviewed in detail with the patient. Please refer to After Visit Summary for other counseling recommendations.  Return in about 1 week (around 09/29/2017) for rob.   [place] BingPickens, Johnhenry Tippin, MD

## 2017-09-22 NOTE — MAU Note (Signed)
Kathy AlbinoHannah Key RN (under the supervision of) myself  have communicated with  Dr. Doroteo GlassmanPhelps   and reviewed vital signs:  Vitals:   09/22/17 0309  BP: 136/89  Pulse: 95  Resp: 18  Temp: 97.8 F (36.6 C)  SpO2: 99%    Vaginal exam:  Dilation: 3 Cervical Position: Middle Exam by:: Kathy AlbinoHannah Key, RN,   Also reviewed contraction pattern and that non-stress test is reactive.  It has been documented that patient is contracting every 1-3 minutes. Provider stated "patient is not 5 cm and can go home." Patient denies any other complaints.  Based on this report provider has given order for discharge.  A discharge order and diagnosis entered by a provider.   Labor discharge instructions reviewed with patient.

## 2017-09-28 ENCOUNTER — Ambulatory Visit (INDEPENDENT_AMBULATORY_CARE_PROVIDER_SITE_OTHER): Payer: 59

## 2017-09-28 VITALS — BP 125/80 | HR 72 | Wt 208.0 lb

## 2017-09-28 DIAGNOSIS — Z3480 Encounter for supervision of other normal pregnancy, unspecified trimester: Secondary | ICD-10-CM

## 2017-09-28 NOTE — Patient Instructions (Signed)

## 2017-09-28 NOTE — Progress Notes (Signed)
   PRENATAL VISIT NOTE  Subjective:  Kathy Aguilar is a 31 y.o. G3P1011 at 6544w5d being seen today for ongoing prenatal care.  She is currently monitored for the following issues for this low-risk pregnancy and has Infertility, female, secondary; Supervision of other normal pregnancy, antepartum; Rh negative state in antepartum period, first trimester; Family history of breast cancer; Cervical dysplasia; and Placenta succenturiata on their problem list.  Patient reports occasional contractions. She reports worse contractions at night and it causes problems with her sleeping.  Contractions: Irregular.  .  Movement: Present. Denies leaking of fluid.   The following portions of the patient's history were reviewed and updated as appropriate: allergies, current medications, past family history, past medical history, past social history, past surgical history and problem list. Problem list updated.  Objective:   Vitals:   09/28/17 1310  BP: 125/80  Pulse: 72  Weight: 208 lb (94.3 kg)    Fetal Status: Fetal Heart Rate (bpm): 141 Fundal Height: 39 cm Movement: Present  Presentation: Vertex  General:  Alert, oriented and cooperative. Patient is in no acute distress.  Skin: Skin is warm and dry. No rash noted.   Cardiovascular: Normal heart rate noted  Respiratory: Normal respiratory effort, no problems with respiration noted  Abdomen: Soft, gravid, appropriate for gestational age.  Pain/Pressure: Present     Pelvic: Cervical exam performed Dilation: 3 Effacement (%): 50 Station: -3  Extremities: Normal range of motion.     Mental Status:  Normal mood and affect. Normal behavior. Normal judgment and thought content.   Assessment and Plan:  Pregnancy: G3P1011 at 5344w5d  1. Supervision of other normal pregnancy, antepartum -Deberah PeltonBraxton Hicks and labor precautions reviewed -Reviewed safe medications for sleep in pregnancy  Term labor symptoms and general obstetric precautions including but not  limited to vaginal bleeding, contractions, leaking of fluid and fetal movement were reviewed in detail with the patient. Please refer to After Visit Summary for other counseling recommendations.  Return in about 1 week (around 10/05/2017) for Return OB visit.   Rolm BookbinderCaroline M Auden Tatar, CNM 09/28/17 1:27 PM

## 2017-09-30 ENCOUNTER — Other Ambulatory Visit: Payer: Self-pay

## 2017-09-30 ENCOUNTER — Inpatient Hospital Stay (HOSPITAL_COMMUNITY): Payer: 59 | Admitting: Anesthesiology

## 2017-09-30 ENCOUNTER — Inpatient Hospital Stay (HOSPITAL_COMMUNITY)
Admission: AD | Admit: 2017-09-30 | Discharge: 2017-10-03 | DRG: 807 | Disposition: A | Discharge status: Home / Self Care | Payer: 59 | Source: Ambulatory Visit | Attending: Obstetrics and Gynecology | Admitting: Obstetrics and Gynecology

## 2017-09-30 ENCOUNTER — Encounter (HOSPITAL_COMMUNITY): Payer: Self-pay | Admitting: *Deleted

## 2017-09-30 DIAGNOSIS — Z3A39 39 weeks gestation of pregnancy: Secondary | ICD-10-CM | POA: Diagnosis not present

## 2017-09-30 DIAGNOSIS — Z87410 Personal history of cervical dysplasia: Secondary | ICD-10-CM | POA: Diagnosis not present

## 2017-09-30 DIAGNOSIS — Z6791 Unspecified blood type, Rh negative: Secondary | ICD-10-CM

## 2017-09-30 DIAGNOSIS — O26893 Other specified pregnancy related conditions, third trimester: Secondary | ICD-10-CM | POA: Diagnosis present

## 2017-09-30 DIAGNOSIS — Z880 Allergy status to penicillin: Secondary | ICD-10-CM | POA: Diagnosis not present

## 2017-09-30 DIAGNOSIS — Z3480 Encounter for supervision of other normal pregnancy, unspecified trimester: Secondary | ICD-10-CM

## 2017-09-30 LAB — CBC
HEMATOCRIT: 34.4 % — AB (ref 36.0–46.0)
HEMOGLOBIN: 11.6 g/dL — AB (ref 12.0–15.0)
MCH: 28 pg (ref 26.0–34.0)
MCHC: 33.7 g/dL (ref 30.0–36.0)
MCV: 83.1 fL (ref 78.0–100.0)
Platelets: 322 10*3/uL (ref 150–400)
RBC: 4.14 MIL/uL (ref 3.87–5.11)
RDW: 13.8 % (ref 11.5–15.5)
WBC: 17.4 10*3/uL — AB (ref 4.0–10.5)

## 2017-09-30 LAB — TYPE AND SCREEN
ABO/RH(D): A NEG
Antibody Screen: NEGATIVE

## 2017-09-30 LAB — AMNISURE RUPTURE OF MEMBRANE (ROM) NOT AT ARMC: Amnisure ROM: POSITIVE

## 2017-09-30 MED ORDER — OXYCODONE-ACETAMINOPHEN 5-325 MG PO TABS: 1.0000 | ORAL_TABLET | ORAL | Status: DC | PRN

## 2017-09-30 MED ORDER — FENTANYL 2.5 MCG/ML BUPIVACAINE 1/10 % EPIDURAL INFUSION (WH - ANES)
14.0000 mL/h | INTRAMUSCULAR | Status: DC | PRN
  Administered 2017-09-30 – 2017-10-01 (×3): 14 mL/h via EPIDURAL
  Filled 2017-09-30 (×3): qty 100

## 2017-09-30 MED ORDER — SOD CITRATE-CITRIC ACID 500-334 MG/5ML PO SOLN: 30.0000 mL | ORAL | Status: DC | PRN

## 2017-09-30 MED ORDER — ONDANSETRON HCL 4 MG/2ML IJ SOLN
4.0000 mg | Freq: Four times a day (QID) | INTRAMUSCULAR | Status: DC | PRN
  Administered 2017-09-30 – 2017-10-01 (×2): 4 mg via INTRAVENOUS
  Filled 2017-09-30 (×2): qty 2

## 2017-09-30 MED ORDER — PHENYLEPHRINE 40 MCG/ML (10ML) SYRINGE FOR IV PUSH (FOR BLOOD PRESSURE SUPPORT)
80.0000 ug | PREFILLED_SYRINGE | INTRAVENOUS | Status: DC | PRN
  Filled 2017-09-30: qty 5
  Filled 2017-09-30: qty 10

## 2017-09-30 MED ORDER — LIDOCAINE HCL (PF) 1 % IJ SOLN
INTRAMUSCULAR | Status: DC | PRN
  Administered 2017-09-30 (×2): 5 mL

## 2017-09-30 MED ORDER — OXYTOCIN BOLUS FROM INFUSION
500.0000 mL | Freq: Once | INTRAVENOUS | Status: AC
  Administered 2017-10-01: 500 mL via INTRAVENOUS

## 2017-09-30 MED ORDER — OXYTOCIN 40 UNITS IN LACTATED RINGERS INFUSION - SIMPLE MED
1.0000 m[IU]/min | INTRAVENOUS | Status: DC
  Administered 2017-09-30: 2 m[IU]/min via INTRAVENOUS
  Administered 2017-10-01: 12 m[IU]/min via INTRAVENOUS

## 2017-09-30 MED ORDER — PHENYLEPHRINE 40 MCG/ML (10ML) SYRINGE FOR IV PUSH (FOR BLOOD PRESSURE SUPPORT)
80.0000 ug | PREFILLED_SYRINGE | INTRAVENOUS | Status: DC | PRN
  Filled 2017-09-30: qty 5

## 2017-09-30 MED ORDER — LACTATED RINGERS IV SOLN
INTRAVENOUS | Status: DC
  Administered 2017-09-30 (×2): via INTRAVENOUS

## 2017-09-30 MED ORDER — EPHEDRINE 5 MG/ML INJ
10.0000 mg | INTRAVENOUS | Status: DC | PRN
  Filled 2017-09-30: qty 2

## 2017-09-30 MED ORDER — LIDOCAINE HCL (PF) 1 % IJ SOLN
30.0000 mL | INTRAMUSCULAR | Status: DC | PRN
  Filled 2017-09-30: qty 30

## 2017-09-30 MED ORDER — GENTAMICIN SULFATE 40 MG/ML IJ SOLN
190.0000 mg | Freq: Three times a day (TID) | INTRAVENOUS | Status: DC
  Administered 2017-09-30 – 2017-10-02 (×6): 190 mg via INTRAVENOUS
  Filled 2017-09-30 (×6): qty 4.75

## 2017-09-30 MED ORDER — GENTAMICIN SULFATE 40 MG/ML IJ SOLN: 5.0000 mg/kg | INTRAVENOUS | Status: DC

## 2017-09-30 MED ORDER — OXYCODONE-ACETAMINOPHEN 5-325 MG PO TABS: 2.0000 | ORAL_TABLET | ORAL | Status: DC | PRN

## 2017-09-30 MED ORDER — FLEET ENEMA 7-19 GM/118ML RE ENEM: 1.0000 | ENEMA | RECTAL | Status: DC | PRN

## 2017-09-30 MED ORDER — OXYTOCIN 40 UNITS IN LACTATED RINGERS INFUSION - SIMPLE MED
2.5000 [IU]/h | INTRAVENOUS | Status: DC
  Filled 2017-09-30: qty 1000

## 2017-09-30 MED ORDER — SODIUM CHLORIDE 0.9 % IV SOLN
2.0000 g | Freq: Four times a day (QID) | INTRAVENOUS | Status: DC
  Administered 2017-09-30 – 2017-10-02 (×7): 2 g via INTRAVENOUS
  Filled 2017-09-30 (×8): qty 2000

## 2017-09-30 MED ORDER — LACTATED RINGERS IV SOLN
500.0000 mL | Freq: Once | INTRAVENOUS | Status: AC
  Administered 2017-09-30: 500 mL via INTRAVENOUS

## 2017-09-30 MED ORDER — DIPHENHYDRAMINE HCL 50 MG/ML IJ SOLN: 12.5000 mg | INTRAMUSCULAR | Status: DC | PRN

## 2017-09-30 MED ORDER — SODIUM CHLORIDE 0.9 % IV SOLN: 2.0000 g | Freq: Four times a day (QID) | INTRAVENOUS | Status: DC

## 2017-09-30 MED ORDER — ACETAMINOPHEN 500 MG PO TABS
1000.0000 mg | ORAL_TABLET | Freq: Four times a day (QID) | ORAL | Status: DC | PRN
  Administered 2017-09-30: 1000 mg via ORAL
  Filled 2017-09-30: qty 2

## 2017-09-30 MED ORDER — ACETAMINOPHEN 325 MG PO TABS
650.0000 mg | ORAL_TABLET | ORAL | Status: DC | PRN
  Filled 2017-09-30: qty 2

## 2017-09-30 MED ORDER — LACTATED RINGERS IV SOLN
500.0000 mL | INTRAVENOUS | Status: DC | PRN
  Administered 2017-10-01: 500 mL via INTRAVENOUS

## 2017-09-30 MED ORDER — TERBUTALINE SULFATE 1 MG/ML IJ SOLN
0.2500 mg | Freq: Once | INTRAMUSCULAR | Status: DC | PRN
  Filled 2017-09-30: qty 1

## 2017-09-30 NOTE — MAU Note (Signed)
[facility] the BS [facility] fluid leaking in and around vagina.  Dr. Parke SimmersBland notified, amni sure ordered, collected and sent to lab.

## 2017-09-30 NOTE — MAU Note (Signed)
Urine in lab 

## 2017-09-30 NOTE — H&P (Signed)
LABOR AND DELIVERY ADMISSION HISTORY AND PHYSICAL NOTE  Kathy Aguilar is a 31 y.o. female G3P1011 with [facility]UP at 3398w0d [facility] for [facility]OL for SROM.  She reports positive fetal movement. She denies leakage of fluid or vaginal bleeding.  P[facility]Complications[facility] P[facility] complications:  - secondary infertility -RH neg -placenta succenturiata  [facility]  Dating 12/28 edc, lmp=11wk u/s Blood type: A/Negative/-- (06/08 0929)   Genetic Screen Declines Antibody:Negative (06/08 0929)  Anatomic US  neg Rubella: 1.06 (06/08 0929)  GTT Early:   n/a            Third trimester: 82/145/123 RPR: Non Reactive (06/08 0929) neg  Flu vaccine 07/20/17 HBsAg: Negative (06/08 0929) neg  TDaP vaccine 07/20/17                    Rhogam:07/20/17 HIV: Non-reactive (06/08 0000) neg  Baby Food Breast GBS: (For PCN allergy, check sensitivities) neg  Contraception Undecided Pap: Unsure - Possibly 2016  Circumcision Desires   Pediatrician [facility]   Support Person Ronaldo MiyamotoKyle   Prenatal Classes Not completed as of 07/20/17     Past Medical History: Past Medical History:  Diagnosis Date  . Appendicitis   . Asthma    [place]  . Family history of adverse reaction to anesthesia    mother has vomiting after anesthesia    Past Surgical History: Past Surgical History:  Procedure Laterality Date  . BILATERAL SALPINGECTOMY Right 08/17/2016   Procedure: RIGHT SALPINGECTOMY;  Surgeon: Jerene BearsMary S Miller, MD;  Location: [facility];  Service: Gynecology;  Laterality: Right;  . CHROMOPERTUBATION Bilateral 08/17/2016   Procedure: CHROMOPERTUBATION;  Surgeon: Jerene BearsMary S Miller, MD;  Location: [facility];  Service: Gynecology;  Laterality: Bilateral;  . LAPAROSCOPIC APPENDECTOMY N/A 01/14/2016   Procedure: APPENDECTOMY LAPAROSCOPIC;  Surgeon: Lattie Hawichard E Cooper, MD;  Location: [facility] ORS;  Service: General;  Laterality: N/A;  . LAPAROSCOPIC LYSIS OF ADHESIONS  08/17/2016   Procedure:  LAPAROSCOPIC LYSIS OF ADHESIONS;  Surgeon: Jerene BearsMary S Miller, MD;  Location: [facility];  Service: Gynecology;;  . LAPAROSCOPY N/A 08/17/2016   Procedure: LAPAROSCOPY DIAGNOSTIC with left paratubal cystectomy;  Surgeon: Jerene BearsMary S Miller, MD;  Location: [facility];  Service: Gynecology;  Laterality: N/A;  . SALPINGECTOMY Right     Obstetrical History: OB History    Gravida Para Term Preterm AB Living   3 1 1  0 1 1   SAB TAB Ectopic Multiple Live Births   1 0 0 0 1      Social History: Social History   Socioeconomic History  . Marital status: Married    Spouse name: None  . Number of children: None  . Years of education: None  . Highest education level: None  Social Needs  . Financial resource strain: None  . Food insecurity - worry: None  . Food insecurity - inability: None  . Transportation needs - medical: None  . Transportation needs - non-medical: None  Occupational History  . None  Tobacco Use  . Smoking status: Never Smoker  . Smokeless tobacco: Never Used  Substance and Sexual Activity  . Alcohol use: No    Alcohol/week: 0.6 oz    Types: 1 Standard drinks or equivalent per week  . Drug use: No  . Sexual activity: Yes    Partners: Male    Birth control/protection: None  Other Topics Concern  . None  Social History Narrative  . None    Family  History: Family History  Problem Relation Age of Onset  . Breast cancer Mother 32  . Osteoarthritis Maternal Grandmother   . Hypertension Maternal Grandmother   . Cancer Maternal Grandmother        blood cancer?  . Kidney disease Maternal Grandmother   . Cancer Maternal Grandfather 45       prostate cancer  . Kidney disease Paternal Grandmother     Allergies: No Known Allergies  Medications Prior to Admission  Medication Sig Dispense Refill Last Dose  . calcium carbonate (TUMS - DOSED IN MG ELEMENTAL CALCIUM) 500 MG chewable tablet Chew 1-2 tablets by mouth daily.   09/29/2017 at Unknown time  . cetirizine (ZYRTEC) 10 MG  tablet Take 10 mg by mouth daily.   09/29/2017 at Unknown time  . Prenatal Vit-Fe Fumarate-FA (PRENATAL VITAMIN PO) Take 1 tablet by mouth every morning.    09/29/2017 at Unknown time  . Doxylamine-Pyridoxine ER (BONJESTA) 20-20 MG TBCR Take 1 tablet by mouth at bedtime. Add 1 tab midday prn (Patient not taking: Reported on 09/22/2017) 30 tablet 6 Not Taking  . omeprazole (PRILOSEC OTC) 20 MG tablet Take 1 tablet (20 mg total) by mouth daily. (Patient not taking: Reported on 09/30/2017) 30 tablet 3 Not Taking at Unknown time  . promethazine (PHENERGAN) 25 MG tablet Take 1 tablet (25 mg total) by mouth every 6 (six) hours as needed for nausea or vomiting. (Patient not taking: Reported on 08/31/2017) 30 tablet 0 Not Taking     Review of Systems  All systems reviewed and negative except as stated in HPI  Physical Exam Blood pressure 138/85, pulse 94, temperature (!) 97.4 F (36.3 C), temperature source Oral, resp. rate 20, height 5\' 8"  (1.727 m), weight 94.3 kg (208 lb), last menstrual period 12/31/2016. General appearance: alert, cooperative, appears stated age and no distress Lungs: no IWB Heart: regular rate, distal pulse wnl Abdomen: soft, non-tender Extremities: No calf swelling or tenderness Presentation: cephalic Fetal monitoring: cat 1 Uterine activity: 2-76minutes Dilation: 3 Effacement (%): 70 Station: -3 Exam by:: Millner, RN   Prenatal labs: ABO, Rh: A/Negative/-- (06/08 0929) Antibody: Negative (10/10 0830) Rubella: 1.06 (06/08 0929) RPR: Non Reactive (10/10 0935)  HBsAg: Negative (06/08 0929)  HIV: Non-reactive (06/08 0000)  GC/Chlamydia: neg GBS: Negative (12/06 0000)  1 hr Glucola: third tri  82/145/123 Genetic screening:  declines Anatomy US: neg  Prenatal Transfer Tool  Maternal Diabetes: No Genetic Screening: Declined Maternal Ultrasounds/Referrals: Normal Fetal Ultrasounds or other Referrals:  None Maternal Substance Abuse:  No Significant Maternal  Medications:  none Significant Maternal Lab Results: None  Results for orders placed or performed during the hospital encounter of 09/30/17 (from the past 24 hour(s))  Amnisure rupture of membrane (rom)not at [facility]   Collection Time: 09/30/17 10:55 AM  Result Value Ref Range   Amnisure ROM POSITIVE     Patient Active Problem List   Diagnosis Date Noted  . Indication for care in labor or delivery 09/30/2017  . Placenta succenturiata 05/16/2017  . Cervical dysplasia 03/24/2017  . Supervision of other normal pregnancy, antepartum 03/18/2017  . Rh negative state in antepartum period, first trimester 03/18/2017  . Family history of breast cancer 03/18/2017  . Infertility, female, secondary 07/20/2016    Assessment: Kathy Aguilar is a 31 y.o. G3P1011 at [redacted]w[redacted]d here for IOL for SROM  #Labor: pit #Pain: Epidural planned #FWB: Cat 1 #ID:  gbs neg #MOF: breast #MOC:undecided #Circ:  Yes, will provide list  Marthenia Rolling  09/30/2017, 11:52 AM

## 2017-09-30 NOTE — Progress Notes (Signed)
Pharmacy Antibiotic Note  Kathy Aguilar is a 31 y.o. female admitted on 09/30/2017 with IOL at 39 weeks.  Pharmacy has been consulted for Gentamicin dosing for maternal temp during labor.  Plan: Gentamicin 190mg  IV q8h Will continue to follow and assess need for further kinetic workup and serum Creatinine  Height: 5\' 8"  (172.7 cm) Weight: 208 lb (94.3 kg) IBW/kg (Calculated) : 63.9  Adjusted/Dosing BW: 73 kg  Temp (24hrs), Avg:98.7 F (37.1 C), Min:97.4 F (36.3 C), Max:101.2 F (38.4 C)  Recent Labs  Lab 09/30/17 1145  WBC 17.4*    CrCl cannot be calculated (Patient's most recent lab result is older than the maximum 21 days allowed.).    No Known Allergies  Antimicrobials this admission: Ampicillin 2 gram IV q6h    Thank you for allowing pharmacy to be a part of this patient's care.  Claybon Jabsngel, Massiah Longanecker G 09/30/2017 9:02 PM

## 2017-09-30 NOTE — Anesthesia Pain Management Evaluation Note (Signed)
  [facility] Pain Management Visit Note  Patient: Kathy HeraldHeather M Mehrer, 31 y.o., female  "Hello I am a member of the anesthesia team at [facility]. We have an anesthesia team available at all times to provide care throughout the hospital, including epidural management and anesthesia for C-section. I don't know your plan for the delivery whether it a natural birth, water birth, IV sedation, nitrous supplementation, doula or epidural, but we want to meet your pain goals."   1.Was your pain managed to your expectations on prior hospitalizations?   Yes   2.What is your expectation for pain management during this hospitalization?     Epidural  3.How can we help you reach that goal? epidural  Record the patient's initial score and the patient's pain goal.   Pain: 0  Pain Goal: 4 [facility] wants you to be able to say your pain was always managed very well.  Caily Rakers 09/30/2017

## 2017-09-30 NOTE — Anesthesia Preprocedure Evaluation (Signed)

## 2017-09-30 NOTE — MAU Note (Signed)
Contractions started around 0200 this morning, contractions getting more intense around 0530-0600.  Denies vaginal bleeding, positive for fetal movement, denies vaginal leaking.  EFM applied - FHR 150s, Toco applied - contraction palpated.

## 2017-09-30 NOTE — Anesthesia Procedure Notes (Signed)
Epidural Patient location during procedure: OB  Staffing Anesthesiologist: Zamere Pasternak, MD Performed: anesthesiologist   Preanesthetic Checklist Completed: patient identified, site marked, surgical consent, pre-op evaluation, timeout performed, IV checked, risks and benefits discussed and monitors and equipment checked  Epidural Patient position: sitting Prep: DuraPrep Patient monitoring: heart rate, continuous pulse ox and blood pressure Approach: right paramedian Location: L3-L4 Injection technique: LOR saline  Needle:  Needle type: Tuohy  Needle gauge: 17 G Needle length: 9 cm and 9 Needle insertion depth: 6 cm Catheter type: closed end flexible Catheter size: 20 Guage Catheter at skin depth: 10 cm Test dose: negative  Assessment Events: blood not aspirated, injection not painful, no injection resistance, negative IV test and no paresthesia  Additional Notes Patient identified. Risks/Benefits/Options discussed with patient including but not limited to bleeding, infection, nerve damage, paralysis, failed block, incomplete pain control, headache, blood pressure changes, nausea, vomiting, reactions to medication both or allergic, itching and postpartum back pain. Confirmed with bedside nurse the patient's most recent platelet count. Confirmed with patient that they are not currently taking any anticoagulation, have any bleeding history or any family history of bleeding disorders. Patient expressed understanding and wished to proceed. All questions were answered. Sterile technique was used throughout the entire procedure. Please see nursing notes for vital signs. Test dose was given through epidural needle and negative prior to continuing to dose epidural or start infusion. Warning signs of high block given to the patient including shortness of breath, tingling/numbness in hands, complete motor block, or any concerning symptoms with instructions to call for help. Patient was given  instructions on fall risk and not to get out of bed. All questions and concerns addressed with instructions to call with any issues.     

## 2017-10-01 ENCOUNTER — Encounter (HOSPITAL_COMMUNITY): Payer: Self-pay

## 2017-10-01 DIAGNOSIS — Z3A39 39 weeks gestation of pregnancy: Secondary | ICD-10-CM

## 2017-10-01 LAB — RPR: RPR: NONREACTIVE

## 2017-10-01 MED ORDER — ONDANSETRON HCL 4 MG/2ML IJ SOLN: 4.0000 mg | INTRAMUSCULAR | Status: DC | PRN

## 2017-10-01 MED ORDER — ZOLPIDEM TARTRATE 5 MG PO TABS: 5.0000 mg | ORAL_TABLET | Freq: Every evening | ORAL | Status: DC | PRN

## 2017-10-01 MED ORDER — FENTANYL CITRATE (PF) 100 MCG/2ML IJ SOLN
100.0000 ug | Freq: Once | INTRAMUSCULAR | Status: AC
  Administered 2017-10-01: 100 ug via INTRAVENOUS

## 2017-10-01 MED ORDER — ONDANSETRON HCL 4 MG PO TABS: 4.0000 mg | ORAL_TABLET | ORAL | Status: DC | PRN

## 2017-10-01 MED ORDER — BENZOCAINE-MENTHOL 20-0.5 % EX AERO: 1.0000 "application " | INHALATION_SPRAY | CUTANEOUS | Status: DC | PRN

## 2017-10-01 MED ORDER — DIPHENHYDRAMINE HCL 25 MG PO CAPS: 25.0000 mg | ORAL_CAPSULE | Freq: Four times a day (QID) | ORAL | Status: DC | PRN

## 2017-10-01 MED ORDER — FAMOTIDINE IN NACL 20-0.9 MG/50ML-% IV SOLN
20.0000 mg | Freq: Once | INTRAVENOUS | Status: AC
  Administered 2017-10-01: 20 mg via INTRAVENOUS
  Filled 2017-10-01: qty 50

## 2017-10-01 MED ORDER — FENTANYL CITRATE (PF) 100 MCG/2ML IJ SOLN
INTRAMUSCULAR | Status: AC
  Filled 2017-10-01: qty 2

## 2017-10-01 MED ORDER — ACETAMINOPHEN 325 MG PO TABS
650.0000 mg | ORAL_TABLET | ORAL | Status: DC | PRN
  Administered 2017-10-01 – 2017-10-03 (×4): 650 mg via ORAL
  Filled 2017-10-01 (×4): qty 2

## 2017-10-01 MED ORDER — COCONUT OIL OIL
1.0000 "application " | TOPICAL_OIL | Status: DC | PRN
  Administered 2017-10-02: 1 via TOPICAL
  Filled 2017-10-01: qty 120

## 2017-10-01 MED ORDER — MISOPROSTOL 200 MCG PO TABS
ORAL_TABLET | ORAL | Status: AC
  Filled 2017-10-01: qty 1

## 2017-10-01 MED ORDER — MISOPROSTOL 200 MCG PO TABS
200.0000 ug | ORAL_TABLET | Freq: Once | ORAL | Status: AC
  Administered 2017-10-01: 200 ug via RECTAL

## 2017-10-01 MED ORDER — WITCH HAZEL-GLYCERIN EX PADS: 1.0000 "application " | MEDICATED_PAD | CUTANEOUS | Status: DC | PRN

## 2017-10-01 MED ORDER — SENNOSIDES-DOCUSATE SODIUM 8.6-50 MG PO TABS
2.0000 | ORAL_TABLET | ORAL | Status: DC
  Administered 2017-10-01 – 2017-10-03 (×2): 2 via ORAL
  Filled 2017-10-01 (×2): qty 2

## 2017-10-01 MED ORDER — TETANUS-DIPHTH-ACELL PERTUSSIS 5-2.5-18.5 LF-MCG/0.5 IM SUSP: 0.5000 mL | Freq: Once | INTRAMUSCULAR | Status: DC

## 2017-10-01 MED ORDER — DIBUCAINE 1 % RE OINT: 1.0000 "application " | TOPICAL_OINTMENT | RECTAL | Status: DC | PRN

## 2017-10-01 MED ORDER — PRENATAL MULTIVITAMIN CH
1.0000 | ORAL_TABLET | Freq: Every day | ORAL | Status: DC
  Administered 2017-10-01 – 2017-10-03 (×3): 1 via ORAL
  Filled 2017-10-01 (×3): qty 1

## 2017-10-01 MED ORDER — IBUPROFEN 600 MG PO TABS
600.0000 mg | ORAL_TABLET | Freq: Four times a day (QID) | ORAL | Status: DC
  Administered 2017-10-01 – 2017-10-03 (×9): 600 mg via ORAL
  Filled 2017-10-01 (×9): qty 1

## 2017-10-01 MED ORDER — SODIUM BICARBONATE 8.4 % IV SOLN
INTRAVENOUS | Status: DC | PRN
  Administered 2017-10-01: 5 mL via EPIDURAL

## 2017-10-01 MED ORDER — SIMETHICONE 80 MG PO CHEW: 80.0000 mg | CHEWABLE_TABLET | ORAL | Status: DC | PRN

## 2017-10-01 NOTE — Lactation Note (Signed)
This note was copied from a baby's chart. Lactation Consultation Note  Patient Name: Kathy Sela HuaHeather Heacox RUEAV'WToday's Date: 10/01/2017 Reason for consult: Initial assessment;Term  Baby is 5 hours old,  LC reviewed and updated the doc flow sheets.  As LC entered the room , baby latched, but shallow and dimpling noted.  LC briefly released suction and assisted to obtain depth, several swallows  Noted and per mom more comfortable. Baby ate 23 mins , and nipple well rounded  When abby released. Baby still awake and hungry, attempted to latch on the right breast And areola semi compressible, few sucks and baby fell asleep.  LC Instructed on the use shells for between feedings when not STS, and reverse pressure.  And hand pump.  Per mom will have a DEBP at home ( Medela )  LC also recommended prior to feeding - breast massage , hand express, pre-pump  To make the  Nipple / areola complex more compressible , and reverse pressure.  Left breast / modified laid back did well for this baby and mom.     Maternal Data Has patient been taught Hand Expression?: Yes Does the patient have breastfeeding experience prior to this delivery?: Yes  Feeding Feeding Type: Breast Fed(left breast , already latched, dimpling noted , off few seconds - re-latched ) Length of feed: 23 min(left breast , swallows )  LATCH Score Latch: Grasps breast easily, tongue down, lips flanged, rhythmical sucking.  Audible Swallowing: A few with stimulation  Type of Nipple: Everted at rest and after stimulation  Comfort (Breast/Nipple): Soft / non-tender  Hold (Positioning): Assistance needed to correctly position infant at breast and maintain latch.  LATCH Score: 8  Interventions Interventions: Breast feeding basics reviewed;Assisted with latch;Skin to skin;Breast massage;Breast compression  Lactation Tools Discussed/Used WIC Program: No   Consult Status Consult Status: Follow-up Date: 10/02/17 Follow-up type:  In-patient    Kathy Aguilar 10/01/2017, 1:51 PM

## 2017-10-01 NOTE — Progress Notes (Signed)
Baby awoke and showing feeding cues.  Offered help to mom; refused.  Jtwells, rn  At 2006, mom called in for this nurse to check latch.  Score in baby's flowsheet.

## 2017-10-01 NOTE — Progress Notes (Addendum)
Baby awoke and showing feeding cues.  Offered help to mom; refused.  Jtwells, rn  At 2006, mom called in for this nurse to check latch.  Score in baby's flowsheet.   

## 2017-10-01 NOTE — Progress Notes (Signed)
Labor Progress Note Kathy HeraldHeather M Aguilar is a 31 y.o. G3P1011 at 7454w1d presented for SROM. S: No complaints  O:  BP 130/87   Pulse (!) 134   Temp 98.4 F (36.9 C) (Axillary)   Resp 18   Ht 5\' 8"  (1.727 m)   Wt 208 lb (94.3 kg)   LMP 12/31/2016   SpO2 100%   BMI 31.63 kg/m  EFM: 150 bpm/mod var/pos acels/no decels  CVE: Dilation: 8 Effacement (%): 70, 80 Cervical Position: Middle Station: 0, +1 Presentation: Vertex Exam by:: Dorathy DaftShay Payne RN    A&P: 31 y.o. G3P1011 4154w1d here for SROM. #Labor: No cervical change since 1630. IUPC placed. Continue pitocin until adequate contraction pattern.  Marla Pouliot, DO 1:21 AM

## 2017-10-02 NOTE — Progress Notes (Signed)
Post Partum Day 1 Subjective: no complaints, up ad lib, voiding and tolerating PO  Objective: Blood pressure 115/78, pulse 83, temperature 97.7 F (36.5 C), resp. rate 18, height 5\' 8"  (1.727 m), weight 195 lb (88.5 kg), last menstrual period 12/31/2016, SpO2 98 %, unknown if currently breastfeeding.  Physical Exam:  General: alert, cooperative, appears stated age and no distress Lochia: appropriate Uterine Fundus: firm Incision: n/a DVT Evaluation: No evidence of DVT seen on physical exam.  Recent Labs    09/30/17 1145  HGB 11.6*  HCT 34.4*    Assessment/Plan: Plan for discharge tomorrow   LOS: 2 days   Kathy Aguilar 10/02/2017, 10:07 AM

## 2017-10-02 NOTE — Anesthesia Postprocedure Evaluation (Signed)
Anesthesia Post Note  Patient: Kathy BasemanHeather M Aguilar  Procedure(s) Performed: AN AD HOC LABOR EPIDURAL     Patient location during evaluation: Mother Baby Anesthesia Type: Epidural Level of consciousness: awake and alert and oriented Pain management: pain level controlled Vital Signs Assessment: post-procedure vital signs reviewed and stable Respiratory status: spontaneous breathing and nonlabored ventilation Cardiovascular status: stable Postop Assessment: no headache, patient able to bend at knees, no backache, no apparent nausea or vomiting, epidural receding and adequate PO intake Anesthetic complications: no    Last Vitals:  Vitals:   10/01/17 1615 10/02/17 0601  BP: 121/78 115/78  Pulse: 92 83  Resp: 18 18  Temp: 36.5 C 36.5 C  SpO2:  98%    Last Pain:  Vitals:   10/02/17 0800  TempSrc:   PainSc: 3    Pain Goal:                 [name],[name]

## 2017-10-03 MED ORDER — IBUPROFEN 600 MG PO TABS: 600.0000 mg | ORAL_TABLET | Freq: Four times a day (QID) | ORAL | 0 refills | Status: DC

## 2017-10-03 NOTE — Lactation Note (Signed)
This note was copied from a baby's chart. Lactation Consultation Note; Mom reports baby has just finished nursing for 15 min. Reports he has been cluster feeding through the night. Reassurance given. Reports this baby is doing much better than her first. Has Medela pump for home. Reviewed engorgement prevention and treatment. Reviewed our phone number, OP appointments and BFSG as resources for support after DC. To call prn   Patient Name: Kathy Sela HuaHeather Gutierrez ZOXWR'UToday's Date: 10/03/2017     Maternal Data    Feeding LATCH Score                   Interventions    Lactation Tools Discussed/Used     Consult Status      Pamelia HoitWeeks, Shyne Lehrke D 10/03/2017, 8:12 AM

## 2017-10-03 NOTE — Discharge Instructions (Signed)
Postpartum Care After Vaginal Delivery °The period of time right after you deliver your newborn is called the postpartum period. °What kind of medical care will I receive? °· You may continue to receive fluids and medicines through an IV tube inserted into one of your veins. °· If an incision was made near your vagina (episiotomy) or if you had some vaginal tearing during delivery, cold compresses may be placed on your episiotomy or your tear. This helps to reduce pain and swelling. °· You may be given a squirt bottle to use when you go to the bathroom. You may use this until you are comfortable wiping as usual. To use the squirt bottle, follow these steps: °? Before you urinate, fill the squirt bottle with warm water. Do not use hot water. °? After you urinate, while you are sitting on the toilet, use the squirt bottle to rinse the area around your urethra and vaginal opening. This rinses away any urine and blood. °? You may do this instead of wiping. As you start healing, you may use the squirt bottle before wiping yourself. Make sure to wipe gently. °? Fill the squirt bottle with clean water every time you use the bathroom. °· You will be given sanitary pads to wear. °How can I expect to feel? °· You may not feel the need to urinate for several hours after delivery. °· You will have some soreness and pain in your abdomen and vagina. °· If you are breastfeeding, you may have uterine contractions every time you breastfeed for up to several weeks postpartum. Uterine contractions help your uterus return to its normal size. °· It is normal to have vaginal bleeding (lochia) after delivery. The amount and appearance of lochia is often similar to a menstrual period in the first week after delivery. It will gradually decrease over the next few weeks to a dry, yellow-brown discharge. For most women, lochia stops completely by 6-8 weeks after delivery. Vaginal bleeding can vary from woman to woman. °· Within the first few  days after delivery, you may have breast engorgement. This is when your breasts feel heavy, full, and uncomfortable. Your breasts may also throb and feel hard, tightly stretched, warm, and tender. After this occurs, you may have milk leaking from your breasts. Your health care provider can help you relieve discomfort due to breast engorgement. Breast engorgement should go away within a few days. °· You may feel more sad or worried than normal due to hormonal changes after delivery. These feelings should not last more than a few days. If these feelings do not go away after several days, speak with your health care provider. °How should I care for myself? °· Tell your health care provider if you have pain or discomfort. °· Drink enough water to keep your urine clear or pale yellow. °· Wash your hands thoroughly with soap and water for at least 20 seconds after changing your sanitary pads, after using the toilet, and before holding or feeding your baby. °· If you are not breastfeeding, avoid touching your breasts a lot. Doing this can make your breasts produce more milk. °· If you become weak or lightheaded, or you feel like you might faint, ask for help before: °? Getting out of bed. °? Showering. °· Change your sanitary pads frequently. Watch for any changes in your flow, such as a sudden increase in volume, a change in color, the passing of large blood clots. If you pass a blood clot from your vagina, save it   to show to your health care provider. Do not flush blood clots down the toilet without having your health care provider look at them. °· Make sure that all your vaccinations are up to date. This can help protect you and your baby from getting certain diseases. You may need to have immunizations done before you leave the hospital. °· If desired, talk with your health care provider about methods of family planning or birth control (contraception). °How can I start bonding with my baby? °Spending as much time as  possible with your baby is very important. During this time, you and your baby can get to know each other and develop a bond. Having your baby stay with you in your room (rooming in) can give you time to get to know your baby. Rooming in can also help you become comfortable caring for your baby. Breastfeeding can also help you bond with your baby. °How can I plan for returning home with my baby? °· Make sure that you have a car seat installed in your vehicle. °? Your car seat should be checked by a certified car seat installer to make sure that it is installed safely. °? Make sure that your baby fits into the car seat safely. °· Ask your health care provider any questions you have about caring for yourself or your baby. Make sure that you are able to contact your health care provider with any questions after leaving the hospital. °This information is not intended to replace advice given to you by your health care provider. Make sure you discuss any questions you have with your health care provider. °Document Released: 07/25/2007 Document Revised: 03/01/2016 Document Reviewed: 09/01/2015 °Elsevier Interactive Patient Education © 2018 [employer] ° °

## 2017-10-03 NOTE — Discharge Summary (Signed)
OB Discharge Summary     Patient Name: Kathy HeraldHeather M Mcbeth DOB: 1986-06-04 MRN: [id number]  Date of admission: 09/30/2017 Delivering MD: Marthenia RollingBLAND, SCOTT   Date of discharge: 10/03/2017  Admitting diagnosis: 39WKS,CTX Intrauterine pregnancy: 132w1d     Secondary diagnosis:  Active Problems:   Indication for care in labor or delivery  Additional problems: n/a     Discharge diagnosis: Term Pregnancy Delivered                                                                                                Post partum procedures:n/a  Augmentation: Pitocin  Complications: None  Hospital course:  Induction of Labor With Vaginal Delivery   31 y.o. yo [id number] at 652w1d was admitted to the hospital 09/30/2017 for induction of labor.  Indication for induction: PROM.  Patient had an uncomplicated labor course as follows: Membrane Rupture Time/Date: 2:23 PM ,09/30/2017   Intrapartum Procedures: Episiotomy: None [1]                                         Lacerations:  None [1]  Patient had delivery of a Viable infant.  Information for the patient's newborn:  Virginia CrewsSzuts, Boy Mallery [[id number]][[id number]]  Delivery Method: Vaginal, Spontaneous(Filed from Delivery Summary)   10/01/2017  Details of delivery can be found in separate delivery note.  Patient had a routine postpartum course. Patient is discharged home 10/03/17.  Physical exam  Vitals:   10/01/17 1615 10/02/17 0601 10/02/17 1700 10/03/17 0500  BP: 121/78 115/78 138/79 127/72  Pulse: 92 83 85 76  Resp: 18 18  18   Temp: 97.7 F (36.5 C) 97.7 F (36.5 C)  98.1 F (36.7 C)  TempSrc: Oral   Oral  SpO2:  98%    Weight:  195 lb (88.5 kg)  178 lb (80.7 kg)  Height:       General: alert, cooperative and no distress Lochia: appropriate Uterine Fundus: firm Incision: N/A DVT Evaluation: No evidence of DVT seen on physical exam. Labs: Lab Results  Component Value Date   WBC 17.4 (H) 09/30/2017   HGB 11.6 (L) 09/30/2017   HCT 34.4 (L)  09/30/2017   MCV 83.1 09/30/2017   PLT 322 09/30/2017   CMP Latest Ref Rng & Units 08/16/2017  Glucose 65 - 99 mg/dL 99  BUN 6 - 20 mg/dL 7  Creatinine 1.300.44 - 8.651.00 mg/dL 7.840.58  Sodium 696135 - 295145 mmol/L 136  Potassium 3.5 - 5.1 mmol/L 3.2(L)  Chloride 101 - 111 mmol/L 105  CO2 22 - 32 mmol/L 24  Calcium 8.9 - 10.3 mg/dL 2.8(U8.4(L)  Total Protein 6.5 - 8.1 g/dL 6.8  Total Bilirubin 0.3 - 1.2 mg/dL 1.3(K0.2(L)  Alkaline Phos 38 - 126 U/L 113  AST 15 - 41 U/L 17  ALT 14 - 54 U/L 8(L)    Discharge instruction: per After Visit Summary and "Baby and Me Booklet".  After visit meds:  Allergies as of 10/03/2017   No Known Allergies  Medication List    STOP taking these medications   Doxylamine-Pyridoxine ER 20-20 MG Tbcr Commonly known as:  BONJESTA   omeprazole 20 MG tablet Commonly known as:  PRILOSEC OTC   promethazine 25 MG tablet Commonly known as:  PHENERGAN     TAKE these medications   calcium carbonate 500 MG chewable tablet Commonly known as:  TUMS - dosed in mg elemental calcium Chew 1-2 tablets by mouth daily.   cetirizine 10 MG tablet Commonly known as:  ZYRTEC Take 10 mg by mouth daily.   ibuprofen 600 MG tablet Commonly known as:  ADVIL,MOTRIN Take 1 tablet (600 mg total) by mouth every 6 (six) hours.   PRENATAL VITAMIN PO Take 1 tablet by mouth every morning.       Diet: routine diet  Activity: Advance as tolerated. Pelvic rest for 6 weeks.   Outpatient follow up:4 weeks Follow up Appt: Future Appointments  Date Time Provider Department Center  10/07/2017 11:00 AM [place] BingPickens, Charlie, MD [facility]   Follow up Visit:No Follow-up on file.  Postpartum contraception: unsure, options reviewed   Newborn Data: Live born female  Birth Weight: 8 lb 14.2 oz (4030 g) APGAR: 8, 9  Newborn Delivery   Time head delivered:  05/23/2017 08:13:00 Birth date/time:  10/01/2017 08:14:00 Delivery type:  Vaginal, Spontaneous     Baby Feeding:  Breast Disposition:home with mother   10/03/2017 Rolm Bookbinderaroline M Neill, CNM

## 2017-10-05 ENCOUNTER — Telehealth: Payer: Self-pay | Admitting: Radiology

## 2017-10-05 ENCOUNTER — Encounter (HOSPITAL_COMMUNITY): Payer: Self-pay | Admitting: *Deleted

## 2017-10-05 NOTE — Telephone Encounter (Signed)
Left message on the voicemail that postpartum appointment in scheduled for 10/28/17 @ 10:00 with Dr Macon LargeAnyanwu, call office if this appointment does not work, provided office number

## 2017-10-07 ENCOUNTER — Encounter: Payer: 59 | Admitting: Obstetrics and Gynecology

## 2017-10-20 ENCOUNTER — Encounter: Payer: Self-pay | Admitting: Internal Medicine

## 2017-10-20 ENCOUNTER — Ambulatory Visit (INDEPENDENT_AMBULATORY_CARE_PROVIDER_SITE_OTHER): Payer: 59 | Admitting: Internal Medicine

## 2017-10-20 VITALS — BP 124/78 | HR 84 | Temp 98.1°F | Wt 179.0 lb

## 2017-10-20 DIAGNOSIS — B9789 Other viral agents as the cause of diseases classified elsewhere: Secondary | ICD-10-CM | POA: Diagnosis not present

## 2017-10-20 DIAGNOSIS — J069 Acute upper respiratory infection, unspecified: Secondary | ICD-10-CM | POA: Diagnosis not present

## 2017-10-20 MED ORDER — METHYLPREDNISOLONE ACETATE 80 MG/ML IJ SUSP
80.0000 mg | Freq: Once | INTRAMUSCULAR | Status: AC
Start: 2017-10-20 — End: 2017-10-20
  Administered 2017-10-20: 80 mg via INTRAMUSCULAR

## 2017-10-20 NOTE — Progress Notes (Signed)
HPI  Pt presents to the clinic today with c/o ear pain, sore throat and cough. This started 1 week ago. She describes the ear pain as pressure. She denies difficulty swallowing. The cough is non productive. She denies fever, chills or body aches. She has not taken anything OTC. She has no history of allergies. She has not had sick contacts that she is aware of.  Review of Systems      Past Medical History:  Diagnosis Date  . Appendicitis   . Asthma    [place]  . Family history of adverse reaction to anesthesia    mother has vomiting after anesthesia    Family History  Problem Relation Age of Onset  . Breast cancer Mother [id number]  . Osteoarthritis Maternal Grandmother   . Hypertension Maternal Grandmother   . Cancer Maternal Grandmother        blood cancer?  . Kidney disease Maternal Grandmother   . Cancer Maternal Grandfather [id number]       prostate cancer  . Kidney disease Paternal Grandmother     Social History   Socioeconomic History  . Marital status: Married    Spouse name: Not on file  . Number of children: Not on file  . Years of education: Not on file  . Highest education level: Not on file  Social Needs  . Financial resource strain: Not on file  . Food insecurity - worry: Not on file  . Food insecurity - inability: Not on file  . Transportation needs - medical: Not on file  . Transportation needs - non-medical: Not on file  Occupational History  . Not on file  Tobacco Use  . Smoking status: Never Smoker  . Smokeless tobacco: Never Used  Substance and Sexual Activity  . Alcohol use: No    Alcohol/week: 0.6 oz    Types: 1 Standard drinks or equivalent per week  . Drug use: No  . Sexual activity: Yes    Partners: Male    Birth control/protection: None  Other Topics Concern  . Not on file  Social History Narrative  . Not on file    No Known Allergies   Constitutional: Denies headache, fatigue, fever or abrupt weight changes.  HEENT:  Positive ear pain,  sore throat. Denies eye redness, eye pain, pressure behind the eyes, facial pain, nasal congestion, ringing in the ears, wax buildup, runny nose or bloody nose. Respiratory: Positive cough. Denies difficulty breathing or shortness of breath.  Cardiovascular: Denies chest pain, chest tightness, palpitations or swelling in the hands or feet.   No other specific complaints in a complete review of systems (except as listed in HPI above).  Objective:   BP 124/78   Pulse 84   Temp 98.1 F (36.7 C) (Oral)   Wt 179 lb (81.2 kg)   SpO2 98%   BMI 27.22 kg/m  Wt Readings from Last 3 Encounters:  10/20/17 179 lb (81.2 kg)  10/03/17 178 lb (80.7 kg)  09/28/17 208 lb (94.3 kg)     General: Appears her stated age,in NAD. HEENT: Head: normal shape and size, no sinus tenderness noted; Ears: Tm's gray and intact, normal light reflex; Nose: mucosa pink and moist, septum midline; Throat/Mouth: + PND. Teeth present, mucosa pink and moist, no exudate noted, no lesions or ulcerations noted.  Neck: Bilateral anterior cervical lymphadenopathy.  Pulmonary/Chest: Normal effort and positive vesicular breath sounds. No respiratory distress. No wheezes, rales or ronchi noted.       Assessment & Plan:  Viral Upper Respiratory Infection with Cough:  Get some rest and drink plenty of water Do salt water gargles for the sore throat 80 mg Depo IM today Can take Ibuprofen and Flonase No antihistamine as this can dry up the breastmilk  RTC as needed or if symptoms persist.   Nicki Reaper, NP

## 2017-10-20 NOTE — Patient Instructions (Signed)
Upper Respiratory Infection, Adult Most upper respiratory infections (URIs) are caused by a virus. A URI affects the nose, throat, and upper air passages. The most common type of URI is often called "the common cold." Follow these instructions at home:  Take medicines only as told by your doctor.  Gargle warm saltwater or take cough drops to comfort your throat as told by your doctor.  Use a warm mist humidifier or inhale steam from a shower to increase air moisture. This may make it easier to breathe.  Drink enough fluid to keep your pee (urine) clear or pale yellow.  Eat soups and other clear broths.  Have a healthy diet.  Rest as needed.  Go back to work when your fever is gone or your doctor says it is okay. ? You may need to stay home longer to avoid giving your URI to others. ? You can also wear a face mask and wash your hands often to prevent spread of the virus.  Use your inhaler more if you have asthma.  Do not use any tobacco products, including cigarettes, chewing tobacco, or electronic cigarettes. If you need help quitting, ask your doctor. Contact a doctor if:  You are getting worse, not better.  Your symptoms are not helped by medicine.  You have chills.  You are getting more short of breath.  You have brown or red mucus.  You have yellow or brown discharge from your nose.  You have pain in your face, especially when you bend forward.  You have a fever.  You have puffy (swollen) neck glands.  You have pain while swallowing.  You have white areas in the back of your throat. Get help right away if:  You have very bad or constant: ? Headache. ? Ear pain. ? Pain in your forehead, behind your eyes, and over your cheekbones (sinus pain). ? Chest pain.  You have long-lasting (chronic) lung disease and any of the following: ? Wheezing. ? Long-lasting cough. ? Coughing up blood. ? A change in your usual mucus.  You have a stiff neck.  You have  changes in your: ? Vision. ? Hearing. ? Thinking. ? Mood. This information is not intended to replace advice given to you by your health care provider. Make sure you discuss any questions you have with your health care provider. Document Released: 03/15/2008 Document Revised: 05/30/2016 Document Reviewed: 01/02/2014 Elsevier Interactive Patient Education  2018 Elsevier Inc.  

## 2017-10-20 NOTE — Addendum Note (Signed)
Addended by: Roena MaladyEVONTENNO, Lamica Mccart Y on: 10/20/2017 03:10 PM   Modules accepted: Orders

## 2017-10-28 ENCOUNTER — Encounter: Payer: Self-pay | Admitting: Obstetrics & Gynecology

## 2017-10-28 ENCOUNTER — Ambulatory Visit (INDEPENDENT_AMBULATORY_CARE_PROVIDER_SITE_OTHER): Payer: 59 | Admitting: Obstetrics & Gynecology

## 2017-10-28 DIAGNOSIS — N879 Dysplasia of cervix uteri, unspecified: Secondary | ICD-10-CM

## 2017-10-28 MED ORDER — NORELGESTROMIN-ETH ESTRADIOL 150-35 MCG/24HR TD PTWK: 1.0000 | MEDICATED_PATCH | TRANSDERMAL | 12 refills | Status: DC

## 2017-10-28 NOTE — Patient Instructions (Signed)
Return to clinic for any scheduled appointments or for any gynecologic concerns as needed.   

## 2017-10-28 NOTE — Progress Notes (Signed)
Post Partum Exam  Kathy Aguilar is a 32 y.o. [id number] female who presents for a postpartum visit. She is four weeks postpartum following a delivery on 10/01/2017. I have fully reviewed the prenatal and intrapartum course. The delivery was at 39 gestational weeks, complicated by mild shoulder dystocia and retained placenta requiring removal at bedside with curette.  Anesthesia: epidural. Postpartum course has been complicated by mild viral URI. Baby's course has been uncomplicated.  Baby is feeding by breast and formula. Bleeding:small amount. Bowel function is normal. Bladder function is normal. Patient is not sexually active. Contraception method is nothing at this time but, desires the patch. Postpartum depression screening:negative.  The following portions of the patient's history were reviewed and updated as appropriate: allergies, current medications, past family history, past medical history, past social history, past surgical history and problem list. Last pap smear done 03/18/2017 and was Abnormal- LGSIL, normal colposcopy in 04/2017.   Review of Systems Pertinent items noted in HPI and remainder of comprehensive ROS otherwise negative.    Objective:  Blood pressure 115/72, pulse 76, weight 171 lb (77.6 kg), unknown if currently breastfeeding.  General:  alert and no distress   Breasts:  inspection negative, no nipple discharge or bleeding, no masses or nodularity palpable  Lungs: clear to auscultation bilaterally  Heart:  regular rate and rhythm  Abdomen: soft, non-tender; bowel sounds normal; no masses,  no organomegaly   Pelvic:  not evaluated        Assessment:   Normal postpartum exam. Pap smear not done at today's visit.   Plan:   1. Contraception: Xulane patches weekly. Discussed risk of possible decrease in breast milk, FDA black box warning about increased VTE risk.  2. No other concerns 3. Follow up in: 2 months for BP check or as needed.    Jaynie CollinsUGONNA  Nettie Wyffels, MD,  FACOG Obstetrician & Gynecologist, [facility], [facility]

## 2017-11-18 ENCOUNTER — Encounter: Payer: Self-pay | Admitting: Radiology

## 2017-12-22 NOTE — Progress Notes (Signed)
Subjective:  Kathy Aguilar is a 32 y.o. female here for BP check.    Objective:  BP 128/84   Pulse 93   Appearance alert, well appearing, and in no distress. General exam BP noted to be well controlled today in office.    Assessment:   Blood Pressure well controlled.   Plan:  Current treatment plan is effective, no change in therapy..Marland Kitchen

## 2017-12-23 ENCOUNTER — Ambulatory Visit (INDEPENDENT_AMBULATORY_CARE_PROVIDER_SITE_OTHER): Payer: 59 | Admitting: *Deleted

## 2017-12-23 VITALS — BP 128/84 | HR 93

## 2017-12-23 DIAGNOSIS — Z0130 Encounter for examination of blood pressure without abnormal findings: Secondary | ICD-10-CM

## 2017-12-23 NOTE — Progress Notes (Signed)
I have reviewed the chart and agree with nursing staff's documentation of this patient's encounter.  Catalina AntiguaPeggy Acelin Ferdig, MD 12/23/2017 11:03 AM

## 2018-01-19 ENCOUNTER — Other Ambulatory Visit: Payer: Self-pay

## 2018-01-19 MED ORDER — NORELGESTROMIN-ETH ESTRADIOL 150-35 MCG/24HR TD PTWK: 1.0000 | MEDICATED_PATCH | TRANSDERMAL | 12 refills | Status: DC

## 2018-11-20 ENCOUNTER — Other Ambulatory Visit: Payer: Self-pay

## 2018-11-20 ENCOUNTER — Other Ambulatory Visit (HOSPITAL_COMMUNITY)
Admission: RE | Admit: 2018-11-20 | Discharge: 2018-11-20 | Disposition: A | Discharge status: Home / Self Care | Payer: BC Managed Care – PPO | Source: Ambulatory Visit | Attending: Obstetrics & Gynecology | Admitting: Obstetrics & Gynecology

## 2018-11-20 ENCOUNTER — Ambulatory Visit: Payer: BC Managed Care – PPO | Admitting: Obstetrics & Gynecology

## 2018-11-20 ENCOUNTER — Encounter: Payer: Self-pay | Admitting: Obstetrics & Gynecology

## 2018-11-20 VITALS — BP 120/84 | HR 84 | Resp 16 | Ht 66.25 in | Wt 185.0 lb

## 2018-11-20 DIAGNOSIS — Z124 Encounter for screening for malignant neoplasm of cervix: Secondary | ICD-10-CM | POA: Diagnosis not present

## 2018-11-20 DIAGNOSIS — Z01419 Encounter for gynecological examination (general) (routine) without abnormal findings: Secondary | ICD-10-CM | POA: Diagnosis not present

## 2018-11-20 DIAGNOSIS — M25512 Pain in left shoulder: Secondary | ICD-10-CM

## 2018-11-20 DIAGNOSIS — R87612 Low grade squamous intraepithelial lesion on cytologic smear of cervix (LGSIL): Secondary | ICD-10-CM

## 2018-11-20 MED ORDER — NORELGESTROMIN-ETH ESTRADIOL 150-35 MCG/24HR TD PTWK: 1.0000 | MEDICATED_PATCH | TRANSDERMAL | 4 refills | Status: DC

## 2018-11-20 NOTE — Progress Notes (Signed)
33 y.o. [id number] Married White or Caucasian female here for annual exam.  Doing well.  Son just turned one 12/22.  He's a handful but it's been a good year.    Using the Ortho Evra patches for contraception.  Started about six weeks post partum.  She nursed for 2-3 months.  Cycles are regular and cycles are when she is on off week.    Reports six month hx of left shoulder pain that aches all of the time.  Pain is in the biceps tendon area.  It hurts even when she just lift her left arm.  She will get a shooting pain across the front part of the shoulder.  Denies trauma although reports the summer was hard.    Her mother passed July 1st after breast cancer returned.  She was on treatment but caught pneumonia.  She ended up on a ventilator.  She had just turned 51.  Mother went with her and family to [employer].  Pt feels she was sent home too early and she was readmitted.  It has been hard these last six months.    Patient's last menstrual period was 10/30/2018 (approximate).          Sexually active: Yes.    The current method of family planning is Ortho-Evra patches weekly.   Exercising: No.   Smoker:  no  Health Maintenance: Pap:  03/18/17 LSIL. HR HPV:+detected History of abnormal Pap:  Yes, CIN 1 MMG:  Never TDaP:  2018 Screening Labs: if needed    reports that she has never smoked. She has never used smokeless tobacco. She reports that she does not drink alcohol or use drugs.  Past Medical History:  Diagnosis Date  . Appendicitis   . Asthma    Juvenille  . Family history of adverse reaction to anesthesia    mother has vomiting after anesthesia  . Family history of breast cancer 03/18/2017   Mom at age 9. States mom had negative genetic testing.     Past Surgical History:  Procedure Laterality Date  . BILATERAL SALPINGECTOMY Right 08/17/2016   Procedure: RIGHT SALPINGECTOMY;  Surgeon: Jerene Bears, MD;  Location: [facility];  Service: Gynecology;  Laterality: Right;  .  CHROMOPERTUBATION Bilateral 08/17/2016   Procedure: CHROMOPERTUBATION;  Surgeon: Jerene Bears, MD;  Location: [facility];  Service: Gynecology;  Laterality: Bilateral;  . LAPAROSCOPIC APPENDECTOMY N/A 01/14/2016   Procedure: APPENDECTOMY LAPAROSCOPIC;  Surgeon: Lattie Haw, MD;  Location: [facility];  Service: General;  Laterality: N/A;  . LAPAROSCOPIC LYSIS OF ADHESIONS  08/17/2016   Procedure: LAPAROSCOPIC LYSIS OF ADHESIONS;  Surgeon: Jerene Bears, MD;  Location: [facility];  Service: Gynecology;;  . LAPAROSCOPY N/A 08/17/2016   Procedure: LAPAROSCOPY DIAGNOSTIC with left paratubal cystectomy;  Surgeon: Jerene Bears, MD;  Location: [facility];  Service: Gynecology;  Laterality: N/A;  . SALPINGECTOMY Right     Current Outpatient Medications  Medication Sig Dispense Refill  . ibuprofen (ADVIL,MOTRIN) 600 MG tablet Take 1 tablet (600 mg total) by mouth every 6 (six) hours. 30 tablet 0  . norelgestromin-ethinyl estradiol [employer]ico) 150-35 MCG/24HR transdermal patch Place 1 patch onto the skin once a week. 3 patch 12   No current facility-administered medications for this visit.     Family History  Problem Relation Age of Onset  . Breast cancer Mother 50  . Osteoarthritis Maternal Grandmother   . Hypertension Maternal Grandmother   . Cancer Maternal Grandmother  blood cancer?  . Kidney disease Maternal Grandmother   . Cancer Maternal Grandfather 63       prostate cancer  . Kidney disease Paternal Grandmother     Review of Systems  All other systems reviewed and are negative.   Exam:   BP 120/84 (BP Location: Right Arm, Patient Position: Sitting, Cuff Size: Normal)   Pulse 84   Resp 16   Ht 5' 6.25" (1.683 m)   Wt 185 lb (83.9 kg)   LMP 10/30/2018 (Approximate)   Breastfeeding No   BMI 29.63 kg/m    Height: 5' 6.25" (168.3 cm)  Ht Readings from Last 3 Encounters:  11/20/18 5' 6.25" (1.683 m)  09/30/17 5\' 8"  (1.727 m)  09/22/17 5\' 6"  (1.676 m)    General appearance: alert,  cooperative and appears stated age Head: Normocephalic, without obvious abnormality, atraumatic Neck: no adenopathy, supple, symmetrical, trachea midline and thyroid normal to inspection and palpation Lungs: clear to auscultation bilaterally Breasts: normal appearance, no masses or tenderness Heart: regular rate and rhythm Abdomen: soft, non-tender; bowel sounds normal; no masses,  no organomegaly Extremities: extremities normal, atraumatic, no cyanosis or edema Skin: Skin color, texture, turgor normal. No rashes or lesions Lymph nodes: Cervical, supraclavicular, and axillary nodes normal. No abnormal inguinal nodes palpated Neurologic: Grossly normal   Pelvic: External genitalia:  no lesions              Urethra:  normal appearing urethra with no masses, tenderness or lesions              Bartholins and Skenes: normal                 Vagina: normal appearing vagina with normal color and discharge, no lesions              Cervix: no lesions              Pap taken: Yes.   Bimanual Exam:  Uterus:  normal size, contour, position, consistency, mobility, non-tender              Adnexa: normal adnexa and no mass, fullness, tenderness               Rectovaginal: Confirms               Anus:  normal sphincter tone, no lesions  Chaperone was present for exam.  A:  Well Woman with normal exam Low back issues since delivery Left shoulder pain in biceps tendon region x 6 months On Ortho Evra patch Grief reaction since death of mother Maternal hx of breast cancer age 70 (negative genetic testing).  P:   Mammogram guidelines reviewed.  Will start age 30 due to mother's hx. pap smear and HR HPV obtained today Referral to Dr. Charlett Blake made today RF for Ortho Evra patch.   #3 month supply/4RF. Grief counseling discussed. return annually or prn

## 2018-11-22 LAB — CYTOLOGY - PAP
DIAGNOSIS: NEGATIVE
HPV: NOT DETECTED

## 2019-05-15 ENCOUNTER — Telehealth: Payer: Self-pay | Admitting: Obstetrics & Gynecology

## 2019-05-15 NOTE — Telephone Encounter (Signed)
Spoke with patient. Transdermal patch for contraceptive, once weekly. LMP 7/16 for 5 days and started again on 7/28, bleeding is light. No late or missed patch changes. SA, negative UPT on 05/12/19. Reports intermittent lightheadedness for the past few days, denies lightheadedness at this time . Denies N/V, fever/chills, urinary symptoms, vaginal odor or pain.   Advised patient ok to continue to monitor, if irregular bleeding continues or new symptoms develop, OV needed for further evaluation.  Advised I will review with Dr. Sabra Heck and return call with any additional recommendations. Patient agreeable.  Last AEX 11/20/18  Dr. Sabra Heck -please review.

## 2019-05-15 NOTE — Telephone Encounter (Signed)
Patient is calling regarding abnormal bleeding. Patient stated that she had a regular cycle 04/26/2019 for 5 days, which she stated is "normal for her." Patient stated that she began bleeding again last Tuesday (05/08/2019). Patient stated that the bleeding is very light. Patient declined any cramps, pain, fever or chills. Patient stated that she has been "feeling a little lightheaded," but is unaware if it has any correlation.

## 2019-05-16 NOTE — Telephone Encounter (Signed)
Encounter closed per Dr. Miller.  

## 2019-05-16 NOTE — Telephone Encounter (Signed)
Agree with recommendations.  Ok to close encounter. 

## 2019-05-21 ENCOUNTER — Telehealth: Payer: Self-pay | Admitting: Obstetrics & Gynecology

## 2019-05-21 NOTE — Telephone Encounter (Signed)
Spoke with patient. Patient reports continued irregular menses on transdermal contraceptive, requesting OV. Reports headaches for the last month, no aura. [id number] prescreen negative, precautions reviewed. Patient is aware Dr. Sabra Heck is out of the office, agreeable to schedule with covering provider. OV scheduled for 8/11 at 2pm with Dr. Talbert Nan.    Routing to provider for final review. Patient is agreeable to disposition. Will close encounter.

## 2019-05-21 NOTE — Telephone Encounter (Signed)
Patient is calling to follow up from previous telephone encounter. Patient stated that she stopped bleeding Thursday (05/17/2019) and started bleeding again yesterday (05/20/2019). Patient stated that she took her patch off this morning and is expecting to start her "normal" cycle around Wednesday (05/23/2019)/hc

## 2019-05-22 ENCOUNTER — Other Ambulatory Visit: Payer: Self-pay

## 2019-05-22 ENCOUNTER — Ambulatory Visit: Payer: BC Managed Care – PPO | Admitting: Obstetrics and Gynecology

## 2019-05-22 ENCOUNTER — Encounter: Payer: Self-pay | Admitting: Obstetrics and Gynecology

## 2019-05-22 DIAGNOSIS — N926 Irregular menstruation, unspecified: Secondary | ICD-10-CM

## 2019-05-22 LAB — POCT URINE PREGNANCY: Preg Test, Ur: NEGATIVE

## 2019-05-22 NOTE — Progress Notes (Signed)
Patient left without being seen.

## 2019-05-25 ENCOUNTER — Other Ambulatory Visit: Payer: Self-pay

## 2019-05-25 ENCOUNTER — Ambulatory Visit (INDEPENDENT_AMBULATORY_CARE_PROVIDER_SITE_OTHER): Payer: BC Managed Care – PPO | Admitting: Obstetrics and Gynecology

## 2019-05-25 ENCOUNTER — Encounter: Payer: Self-pay | Admitting: Obstetrics and Gynecology

## 2019-05-25 VITALS — BP 138/88 | HR 80 | Temp 98.0°F | Ht 66.25 in | Wt 183.0 lb

## 2019-05-25 DIAGNOSIS — N921 Excessive and frequent menstruation with irregular cycle: Secondary | ICD-10-CM

## 2019-05-25 NOTE — Progress Notes (Signed)
GYNECOLOGY  VISIT   HPI: 33 y.o.   Married White or Caucasian Not Hispanic or Latino  female   [phone] with Patient's last menstrual period was 05/23/2019 (exact date).   here for irregular bleeding on the Ortho Evra patch.  Since the end of July she has had almost daily spotting to light bleeding. Changing a minipad 2 x a day. No late patches.  She is currently on her cycle.  Typically she bleeds monthly x 5 days. Moderate flow, no cramps.  Married, no dyspareunia, no postcoital spotting.   Some increase in stress, she manages a security company, her husband is a Emergency planning/management officerpolice officer and her 34 year old daughter is going to do remote learning this year . She is always tired, no change. No other thyroid c/o.  She does c/o an increase in headaches in the last few months, no auras. Always in the left temporal region, last for hours.   8521 month old son, 34 year old girl.   GYNECOLOGIC HISTORY: Patient's last menstrual period was 05/23/2019 (exact date). Contraception: patch Menopausal hormone therapy: none        OB History    Gravida  3   Para  2   Term  2   Preterm  0   AB  1   Living  2     SAB  1   TAB  0   Ectopic  0   Multiple  0   Live Births  2              Patient Active Problem List   Diagnosis Date Noted  . Cervical dysplasia 03/24/2017    Past Medical History:  Diagnosis Date  . Asthma    Juvenille  . Family history of adverse reaction to anesthesia    mother has vomiting after anesthesia  . Family history of breast cancer 03/18/2017   Mom at age 34. States mom had negative genetic testing.     Past Surgical History:  Procedure Laterality Date  . BILATERAL SALPINGECTOMY Right 08/17/2016   Procedure: RIGHT SALPINGECTOMY;  Surgeon: Jerene BearsMary S Miller, MD;  Location: [facility];  Service: Gynecology;  Laterality: Right;  . CHROMOPERTUBATION Bilateral 08/17/2016   Procedure: CHROMOPERTUBATION;  Surgeon: Jerene BearsMary S Miller, MD;  Location: [facility];  Service:  Gynecology;  Laterality: Bilateral;  . LAPAROSCOPIC APPENDECTOMY N/A 01/14/2016   Procedure: APPENDECTOMY LAPAROSCOPIC;  Surgeon: Lattie Hawichard E Cooper, MD;  Location: [facility];  Service: General;  Laterality: N/A;  . LAPAROSCOPIC LYSIS OF ADHESIONS  08/17/2016   Procedure: LAPAROSCOPIC LYSIS OF ADHESIONS;  Surgeon: Jerene BearsMary S Miller, MD;  Location: [facility];  Service: Gynecology;;  . LAPAROSCOPY N/A 08/17/2016   Procedure: LAPAROSCOPY DIAGNOSTIC with left paratubal cystectomy;  Surgeon: Jerene BearsMary S Miller, MD;  Location: [facility];  Service: Gynecology;  Laterality: N/A;  . SALPINGECTOMY Right    SURGICAL ENTRY ABOVE IS INCORRECT    Current Outpatient Medications  Medication Sig Dispense Refill  . ibuprofen (ADVIL,MOTRIN) 600 MG tablet Take 1 tablet (600 mg total) by mouth every 6 (six) hours. 30 tablet 0  . norelgestromin-ethinyl estradiol Burr Medico(XULANE) 150-35 MCG/24HR transdermal patch Place 1 patch onto the skin once a week. 3 Package 4   No current facility-administered medications for this visit.      ALLERGIES: Patient has no known allergies.  Family History  Problem Relation Age of Onset  . Breast cancer Mother [id number]  . Osteoarthritis Maternal Grandmother   . Hypertension Maternal Grandmother   .  Cancer Maternal Grandmother        blood cancer?  . Kidney disease Maternal Grandmother   . Cancer Maternal Grandfather 23       prostate cancer  . Kidney disease Paternal Grandmother     Social History   Socioeconomic History  . Marital status: Married    Spouse name: Not on file  . Number of children: Not on file  . Years of education: Not on file  . Highest education level: Not on file  Occupational History  . Not on file  Social Needs  . Financial resource strain: Not on file  . Food insecurity    Worry: Not on file    Inability: Not on file  . Transportation needs    Medical: Not on file    Non-medical: Not on file  Tobacco Use  . Smoking status: Never Smoker  . Smokeless tobacco: Never  Used  Substance and Sexual Activity  . Alcohol use: No  . Drug use: No  . Sexual activity: Yes    Partners: Male    Birth control/protection: Patch  Lifestyle  . Physical activity    Days per week: Not on file    Minutes per session: Not on file  . Stress: Not on file  Relationships  . Social Herbalist on phone: Not on file    Gets together: Not on file    Attends religious service: Not on file    Active member of club or organization: Not on file    Attends meetings of clubs or organizations: Not on file    Relationship status: Not on file  . Intimate partner violence    Fear of current or ex partner: Not on file    Emotionally abused: Not on file    Physically abused: Not on file    Forced sexual activity: Not on file  Other Topics Concern  . Not on file  Social History Narrative  . Not on file    Review of Systems  Constitutional: Negative.   HENT: Negative.   Eyes: Negative.   Respiratory: Negative.   Cardiovascular: Negative.   Gastrointestinal: Negative.   Genitourinary:       Irregular bleeding   Musculoskeletal: Negative.   Skin: Negative.   Neurological: Negative.   Endo/Heme/Allergies: Negative.   Psychiatric/Behavioral: Negative.   All other systems reviewed and are negative.   PHYSICAL EXAMINATION:    BP 138/88   Pulse 80   Temp 98 F (36.7 C) (Temporal)   Ht 5' 6.25" (1.683 m)   Wt 183 lb (83 kg)   LMP 05/23/2019 (Exact Date)   BMI 29.31 kg/m     General appearance: alert, cooperative and appears stated age Neck: no adenopathy, supple, symmetrical, trachea midline and thyroid normal to inspection and palpation Abdomen: soft, non-tender; non distended, no masses,  no organomegaly  Pelvic: External genitalia:  no lesions              Urethra:  normal appearing urethra with no masses, tenderness or lesions              Bartholins and Skenes: normal                 Vagina: normal appearing vagina with normal color and discharge, no  lesions              Cervix: no cervical motion tenderness, no lesions and not friable  Bimanual Exam:  Uterus:  normal size, contour, position, consistency, mobility, non-tender              Adnexa: no masses, mildly tender on the right                Chaperone was present for exam.  ASSESSMENT One month of breakthrough bleeding on the Ortho Evra patch, no late or missed patches. Normal exam Under increased stress, suspect that is the cause of her increase in headaches.     PLAN Recommended she track her bleeding, if the abnormal bleeding persists would recommend a pelvic ultrasound or she could do a trial of an OCP.  She will f/u with Dr Hyacinth MeekerMiller if symptoms persist.   An After Visit Summary was printed and given to the patient.

## 2019-06-15 ENCOUNTER — Other Ambulatory Visit: Payer: Self-pay | Admitting: Obstetrics & Gynecology

## 2019-06-15 NOTE — Telephone Encounter (Signed)
Medication refill request: Zulane Last AEX:  11/20/2018 MM Next AEX: none Last MMG (if hormonal medication request): n/a Refill authorized: Pending authorization. #9 with 1 refill if appropriate. Please advise.

## 2019-10-16 ENCOUNTER — Ambulatory Visit (INDEPENDENT_AMBULATORY_CARE_PROVIDER_SITE_OTHER): Payer: BC Managed Care – PPO | Admitting: Internal Medicine

## 2019-10-16 ENCOUNTER — Encounter: Payer: Self-pay | Admitting: Internal Medicine

## 2019-10-16 ENCOUNTER — Other Ambulatory Visit: Payer: Self-pay

## 2019-10-16 VITALS — BP 124/84 | HR 65 | Temp 97.4°F | Wt 190.0 lb

## 2019-10-16 DIAGNOSIS — Z23 Encounter for immunization: Secondary | ICD-10-CM

## 2019-10-16 DIAGNOSIS — G43C0 Periodic headache syndromes in child or adult, not intractable: Secondary | ICD-10-CM

## 2019-10-16 MED ORDER — BUTALBITAL-APAP-CAFFEINE 50-325-40 MG PO TABS: 1.0000 | ORAL_TABLET | Freq: Four times a day (QID) | ORAL | 0 refills | Status: DC | PRN

## 2019-10-16 NOTE — Progress Notes (Signed)
Subjective:    Patient ID: Kathy Aguilar, female    DOB: [birth year], 34 y.o.   MRN: [id number]  HPI  Pt presents to the clinic today with c/o headaches. This started 1 year ago but has been intermittent since that time. These occur every 2-3 months. The pain is located in the left side of her head. She describes the pain as sharp and throbbing. She has some visual changes in the left eye and mild nause but denies dizziness, sensitivity to light or sound and vomiting. She is not sure what triggers this. It is not worse around the time of her menses. She has tried Ibuprofen, Excedrin and Tylenol without any relief. Her grandmother has a history of migraines. There is no family history of brain tumor or aneurysm.   Review of Systems      Past Medical History:  Diagnosis Date  . Asthma    Juvenille  . Family history of adverse reaction to anesthesia    mother has vomiting after anesthesia  . Family history of breast cancer 03/18/2017   Mom at age 15. States mom had negative genetic testing.     Current Outpatient Medications  Medication Sig Dispense Refill  . ibuprofen (ADVIL,MOTRIN) 600 MG tablet Take 1 tablet (600 mg total) by mouth every 6 (six) hours. 30 tablet 0  . norelgestromin-ethinyl estradiol [facility]r [facility]ico) 150-35 MCG/24HR transdermal patch Place 1 patch onto the skin once a week. Use for 3 weeks and then one week off. 9 patch 1   No current facility-administered medications for this visit.    No Known Allergies  Family History  Problem Relation Age of Onset  . Breast cancer Mother 77  . Osteoarthritis Maternal Grandmother   . Hypertension Maternal Grandmother   . Cancer Maternal Grandmother        blood cancer?  . Kidney disease Maternal Grandmother   . Cancer Maternal Grandfather 35       prostate cancer  . Kidney disease Paternal Grandmother     Social History   Socioeconomic History  . Marital status: Married    Spouse name: Not on file  . Number of children:  Not on file  . Years of education: Not on file  . Highest education level: Not on file  Occupational History  . Not on file  Tobacco Use  . Smoking status: Never Smoker  . Smokeless tobacco: Never Used  Substance and Sexual Activity  . Alcohol use: No  . Drug use: No  . Sexual activity: Yes    Partners: Male    Birth control/protection: Patch  Other Topics Concern  . Not on file  Social History Narrative  . Not on file   Social Determinants of Health   Financial Resource Strain:   . Difficulty of Paying Living Expenses: Not on file  Food Insecurity:   . Worried About Programme researcher, broadcasting/film/video in the Last Year: Not on file  . Ran Out of Food in the Last Year: Not on file  Transportation Needs:   . Lack of Transportation (Medical): Not on file  . Lack of Transportation (Non-Medical): Not on file  Physical Activity:   . Days of Exercise per Week: Not on file  . Minutes of Exercise per Session: Not on file  Stress:   . Feeling of Stress : Not on file  Social Connections:   . Frequency of Communication with Friends and Family: Not on file  . Frequency of Social Gatherings with Friends and  Family: Not on file  . Attends Religious Services: Not on file  . Active Member of Clubs or Organizations: Not on file  . Attends Banker Meetings: Not on file  . Marital Status: Not on file  Intimate Partner Violence:   . Fear of Current or Ex-Partner: Not on file  . Emotionally Abused: Not on file  . Physically Abused: Not on file  . Sexually Abused: Not on file     Constitutional: Pt reports headaches. Denies fever, malaise, fatigue, or abrupt weight changes.  HEENT: Pt reports visual changes in left eye. Denies eye pain, eye redness, ear pain, ringing in the ears, wax buildup, runny nose, nasal congestion, bloody nose, or sore throat. Respiratory: Denies difficulty breathing, shortness of breath, cough or sputum production.   Cardiovascular: Denies chest pain, chest  tightness, palpitations or swelling in the hands or feet.  Gastrointestinal: Pt reports nausea. Denies abdominal pain, bloating, constipation, diarrhea or blood in the stool.  Neurological: Denies dizziness, difficulty with memory, difficulty with speech or problems with balance and coordination.    No other specific complaints in a complete review of systems (except as listed in HPI above).  Objective:   Physical Exam  BP 124/84   Pulse 65   Temp (!) 97.4 F (36.3 C) (Temporal)   Wt 190 lb (86.2 kg)   LMP 09/18/2019   SpO2 98%   BMI 30.44 kg/m   Wt Readings from Last 3 Encounters:  05/25/19 183 lb (83 kg)  05/22/19 185 lb 12.8 oz (84.3 kg)  11/20/18 185 lb (83.9 kg)    General: Appears her stated age, well developed, well nourished in NAD. HEENT: Head: normal shape and size; Eyes: sclera white, no icterus, conjunctiva pink, PERRLA and EOMs intact;  Cardiovascular: Normal rate and rhythm. . Pulmonary/Chest: Normal effort and positive vesicular breath sounds. No respiratory distress. No wheezes, rales or ronchi noted.  Musculoskeletal: Normal flexion, extension and rotation of the cervical spine.  Neurological: Alert and oriented.  Coordination normal.    BMET    Component Value Date/Time   NA 136 08/16/2017 1654   K 3.2 (L) 08/16/2017 1654   CL 105 08/16/2017 1654   CO2 24 08/16/2017 1654   GLUCOSE 99 08/16/2017 1654   BUN 7 08/16/2017 1654   CREATININE 0.58 08/16/2017 1654   CREATININE 0.79 07/31/2015 1510   CALCIUM 8.4 (L) 08/16/2017 1654   GFRNONAA >60 08/16/2017 1654   GFRAA >60 08/16/2017 1654    Lipid Panel     Component Value Date/Time   CHOL 184 08/23/2016 1523   TRIG 224.0 (H) 08/23/2016 1523   HDL 44.70 08/23/2016 1523   CHOLHDL 4 08/23/2016 1523   VLDL 44.8 (H) 08/23/2016 1523   LDLCALC 85 07/31/2015 1510    CBC    Component Value Date/Time   WBC 17.4 (H) 09/30/2017 1145   RBC 4.14 09/30/2017 1145   HGB 11.6 (L) 09/30/2017 1145   HGB 10.8  (L) 07/20/2017 0830   HCT 34.4 (L) 09/30/2017 1145   HCT 32.3 (L) 07/20/2017 0830   PLT 322 09/30/2017 1145   PLT 272 07/20/2017 0830   MCV 83.1 09/30/2017 1145   MCV 88 07/20/2017 0830   MCH 28.0 09/30/2017 1145   MCHC 33.7 09/30/2017 1145   RDW 13.8 09/30/2017 1145   RDW 14.5 07/20/2017 0830   LYMPHSABS 2.1 03/18/2017 0929   EOSABS 0.2 03/18/2017 0929   BASOSABS 0.0 03/18/2017 0929    Hgb A1C Lab Results  Component Value Date   HGBA1C 5.1 08/23/2016           Assessment & Plan:  Migrianes:  Encouraged adequate rest and water intake RX for Fioricet as needed If ineffective, will try Imitrex 25 mg  No indication for preventative therapy at this time  Webb Silversmith, NP This visit occurred during the SARS-CoV-2 public health emergency.  Safety protocols were in place, including screening questions prior to the visit, additional usage of staff PPE, and extensive cleaning of exam room while observing appropriate contact time as indicated for disinfecting solutions.

## 2019-10-16 NOTE — Patient Instructions (Signed)

## 2019-10-17 ENCOUNTER — Encounter: Payer: Self-pay | Admitting: Internal Medicine

## 2019-10-28 ENCOUNTER — Encounter: Payer: Self-pay | Admitting: Internal Medicine

## 2019-10-29 MED ORDER — SUMATRIPTAN SUCCINATE 25 MG PO TABS: 25.0000 mg | ORAL_TABLET | ORAL | 0 refills | Status: DC | PRN

## 2019-11-22 ENCOUNTER — Other Ambulatory Visit: Payer: Self-pay | Admitting: Internal Medicine

## 2019-12-06 ENCOUNTER — Other Ambulatory Visit: Payer: Self-pay | Admitting: Obstetrics & Gynecology

## 2019-12-13 ENCOUNTER — Other Ambulatory Visit: Payer: Self-pay | Admitting: *Deleted

## 2019-12-13 ENCOUNTER — Other Ambulatory Visit: Payer: Self-pay | Admitting: Obstetrics & Gynecology

## 2019-12-13 MED ORDER — XULANE 150-35 MCG/24HR TD PTWK: 1.0000 | MEDICATED_PATCH | TRANSDERMAL | 0 refills | Status: DC

## 2019-12-13 NOTE — Telephone Encounter (Signed)
Patient requesting refill on birth control. Pharmacy is correct in system. Aex scheduled 3/23 at 9:00.

## 2019-12-13 NOTE — Telephone Encounter (Signed)
Medication refill request: XULANE Last AEX:  11/20/18 SM Next AEX: 01/01/20 Last MMG (if hormonal medication request): n/a Refill authorized: Please advise on refill; order pended #3 w/0 refills if authorized

## 2019-12-16 ENCOUNTER — Other Ambulatory Visit: Payer: Self-pay | Admitting: Internal Medicine

## 2019-12-17 NOTE — Telephone Encounter (Signed)
Last filled 10/29/2019.... please advise  

## 2019-12-31 ENCOUNTER — Ambulatory Visit: Payer: BC Managed Care – PPO | Admitting: Obstetrics & Gynecology

## 2019-12-31 ENCOUNTER — Other Ambulatory Visit (HOSPITAL_COMMUNITY)
Admission: RE | Admit: 2019-12-31 | Discharge: 2019-12-31 | Disposition: A | Discharge status: Home / Self Care | Payer: BC Managed Care – PPO | Source: Ambulatory Visit | Attending: Obstetrics & Gynecology | Admitting: Obstetrics & Gynecology

## 2019-12-31 ENCOUNTER — Encounter: Payer: Self-pay | Admitting: Obstetrics & Gynecology

## 2019-12-31 ENCOUNTER — Other Ambulatory Visit: Payer: Self-pay

## 2019-12-31 VITALS — BP 118/70 | HR 84 | Temp 97.2°F | Resp 10 | Ht 66.25 in | Wt 184.0 lb

## 2019-12-31 DIAGNOSIS — Z124 Encounter for screening for malignant neoplasm of cervix: Secondary | ICD-10-CM | POA: Diagnosis not present

## 2019-12-31 DIAGNOSIS — N870 Mild cervical dysplasia: Secondary | ICD-10-CM

## 2019-12-31 DIAGNOSIS — Z0000 Encounter for general adult medical examination without abnormal findings: Secondary | ICD-10-CM | POA: Diagnosis not present

## 2019-12-31 DIAGNOSIS — Z01419 Encounter for gynecological examination (general) (routine) without abnormal findings: Secondary | ICD-10-CM

## 2019-12-31 MED ORDER — XULANE 150-35 MCG/24HR TD PTWK: 1.0000 | MEDICATED_PATCH | TRANSDERMAL | 4 refills | Status: DC

## 2019-12-31 NOTE — Progress Notes (Signed)
34 y.o. [id number] Married White or Caucasian female here for annual exam. Patient needs a refill on birth control.  She is considering another pregnancy.  Has immune rubella test in 2018.  Patient's last menstrual period was 12/13/2019.          Sexually active: Yes.    The current method of family planning is Ortho-Evra patches.    Exercising: No.  walking Smoker:  no  Health Maintenance: Pap:  11/20/18 Neg:Neg HR HPV  03/18/17 LSIL with cells suggestive of high grade lesion. HR HPV:+detected History of abnormal Pap:  Yes, CIN 1 TDaP:  2018 Hep C testing: n/a Screening Labs: discuss today   reports that she has never smoked. She has never used smokeless tobacco. She reports that she does not drink alcohol or use drugs.  Past Medical History:  Diagnosis Date  . Asthma    Juvenille  . Family history of adverse reaction to anesthesia    mother has vomiting after anesthesia  . Family history of breast cancer 03/18/2017   Mom at age 4. States mom had negative genetic testing.     Past Surgical History:  Procedure Laterality Date  . BILATERAL SALPINGECTOMY Right 08/17/2016   Procedure: RIGHT SALPINGECTOMY;  Surgeon: Megan Salon, MD;  Location: [facility];  Service: Gynecology;  Laterality: Right;  . CHROMOPERTUBATION Bilateral 08/17/2016   Procedure: CHROMOPERTUBATION;  Surgeon: Megan Salon, MD;  Location: [facility];  Service: Gynecology;  Laterality: Bilateral;  . LAPAROSCOPIC APPENDECTOMY N/A 01/14/2016   Procedure: APPENDECTOMY LAPAROSCOPIC;  Surgeon: Florene Glen, MD;  Location: [facility];  Service: General;  Laterality: N/A;  . LAPAROSCOPIC LYSIS OF ADHESIONS  08/17/2016   Procedure: LAPAROSCOPIC LYSIS OF ADHESIONS;  Surgeon: Megan Salon, MD;  Location: [facility];  Service: Gynecology;;  . LAPAROSCOPY N/A 08/17/2016   Procedure: LAPAROSCOPY DIAGNOSTIC with left paratubal cystectomy;  Surgeon: Megan Salon, MD;  Location: [facility];  Service: Gynecology;  Laterality: N/A;  . SALPINGECTOMY  Right    SURGICAL ENTRY ABOVE IS INCORRECT    Current Outpatient Medications  Medication Sig Dispense Refill  . meloxicam (MOBIC) 15 MG tablet Take 15 mg by mouth as needed.    . norelgestromin-ethinyl estradiol Marilu Favre) 150-35 MCG/24HR transdermal patch Place 1 patch onto the skin once a week. Use for 3 weeks and then one week off. 3 patch 0  . SUMAtriptan (IMITREX) 25 MG tablet TAKE 1 TABLET BY MOUTH EVERY 2 HOURS AS NEEDED FOR MIGRAINE. MAY REPEAT IN 2 HOURS. 10 tablet 0   No current facility-administered medications for this visit.    Family History  Problem Relation Age of Onset  . Breast cancer Mother 77  . Osteoarthritis Maternal Grandmother   . Hypertension Maternal Grandmother   . Cancer Maternal Grandmother        blood cancer?  . Kidney disease Maternal Grandmother   . Cancer Maternal Grandfather 13       prostate cancer  . Kidney disease Paternal Grandmother     Review of Systems  All other systems reviewed and are negative.   Exam:   BP 118/70 (BP Location: Right Arm, Patient Position: Sitting, Cuff Size: Normal)   Pulse 84   Temp (!) 97.2 F (36.2 C) (Temporal)   Resp 10   Ht 5' 6.25" (1.683 m)   Wt 184 lb (83.5 kg)   LMP 12/13/2019   BMI 29.47 kg/m   Height: 5' 6.25" (168.3 cm)  Ht Readings from Last 3 Encounters:  12/31/19 5' 6.25" (1.683 m)  05/25/19 5' 6.25" (1.683 m)  11/20/18 5' 6.25" (1.683 m)    General appearance: alert, cooperative and appears stated age Head: Normocephalic, without obvious abnormality, atraumatic Neck: no adenopathy, supple, symmetrical, trachea midline and thyroid normal to inspection and palpation Lungs: clear to auscultation bilaterally Breasts: normal appearance, no masses or tenderness Heart: regular rate and rhythm Abdomen: soft, non-tender; bowel sounds normal; no masses,  no organomegaly Extremities: extremities normal, atraumatic, no cyanosis or edema Skin: Skin color, texture, turgor normal. No rashes or  lesions Lymph nodes: Cervical, supraclavicular, and axillary nodes normal. No abnormal inguinal nodes palpated Neurologic: Grossly normal   Pelvic: External genitalia:  no lesions              Urethra:  normal appearing urethra with no masses, tenderness or lesions              Bartholins and Skenes: normal                 Vagina: normal appearing vagina with normal color and discharge, no lesions              Cervix: no lesions              Pap taken: Yes.   Bimanual Exam:  Uterus:  normal size, contour, position, consistency, mobility, non-tender              Adnexa: normal adnexa and no mass, fullness, tenderness               Rectovaginal: Confirms               Anus:  normal sphincter tone, no lesions  Chaperone, Zenovia Jordan, CMA, was present for exam.  A:  Well Woman with normal exam H/o LGSIL with cells suspicious of HGSIL 2018, CIN 1 on biopsy RF for Ortho Evra  Patch Recommended started PNV now Maternal hx of breast cancer at age 35 (she is deceased now but did have negative genetic testing)  P:   Mammogram guidelines reviewed.  Will plan to start at age 20.  Breast MRI has been reviewed as well. Consider referral to genetic testing after next pregnancy pap smear with neg HR HPV obtained today Lab work obtained today:  CBC, CMP, Lipids, TSH and Vit D return annually or prn

## 2020-01-01 ENCOUNTER — Ambulatory Visit: Payer: BC Managed Care – PPO | Admitting: Obstetrics & Gynecology

## 2020-01-01 LAB — COMPREHENSIVE METABOLIC PANEL
ALT: 11 IU/L (ref 0–32)
AST: 17 IU/L (ref 0–40)
Albumin/Globulin Ratio: 1.6 (ref 1.2–2.2)
Albumin: 4.4 g/dL (ref 3.8–4.8)
Alkaline Phosphatase: 80 IU/L (ref 39–117)
BUN/Creatinine Ratio: 14 (ref 9–23)
BUN: 11 mg/dL (ref 6–20)
Bilirubin Total: <0.2 mg/dL (ref 0.0–1.2)
CO2: 25 mmol/L (ref 20–29)
Calcium: 9.2 mg/dL (ref 8.7–10.2)
Chloride: 104 mmol/L (ref 96–106)
Creatinine, Ser: 0.81 mg/dL (ref 0.57–1.00)
GFR calc Af Amer: 109 mL/min/{1.73_m2} (ref >59)
GFR calc non Af Amer: 94 mL/min/{1.73_m2} (ref >59)
Globulin, Total: 2.8 g/dL (ref 1.5–4.5)
Glucose: 84 mg/dL (ref 65–99)
Potassium: 4.2 mmol/L (ref 3.5–5.2)
Sodium: 141 mmol/L (ref 134–144)
Total Protein: 7.2 g/dL (ref 6.0–8.5)

## 2020-01-01 LAB — CYTOLOGY - PAP
Comment: NEGATIVE
Diagnosis: NEGATIVE
High risk HPV: NEGATIVE

## 2020-01-01 LAB — CBC
Hematocrit: 43.8 % (ref 34.0–46.6)
Hemoglobin: 14.9 g/dL (ref 11.1–15.9)
MCH: 29.9 pg (ref 26.6–33.0)
MCHC: 34 g/dL (ref 31.5–35.7)
MCV: 88 fL (ref 79–97)
Platelets: 289 10*3/uL (ref 150–450)
RBC: 4.98 x10E6/uL (ref 3.77–5.28)
RDW: 13.1 % (ref 11.7–15.4)
WBC: 9.2 10*3/uL (ref 3.4–10.8)

## 2020-01-01 LAB — LIPID PANEL
Chol/HDL Ratio: 3.3 ratio (ref 0.0–4.4)
Cholesterol, Total: 167 mg/dL (ref 100–199)
HDL: 51 mg/dL (ref >39)
LDL Chol Calc (NIH): 84 mg/dL (ref 0–99)
Triglycerides: 193 mg/dL — ABNORMAL HIGH (ref 0–149)
VLDL Cholesterol Cal: 32 mg/dL (ref 5–40)

## 2020-01-01 LAB — VITAMIN D 25 HYDROXY (VIT D DEFICIENCY, FRACTURES): Vit D, 25-Hydroxy: 26.8 ng/mL — ABNORMAL LOW (ref 30.0–100.0)

## 2020-01-01 LAB — TSH: TSH: 0.748 u[IU]/mL (ref 0.450–4.500)

## 2020-01-10 ENCOUNTER — Other Ambulatory Visit: Payer: Self-pay | Admitting: Internal Medicine

## 2020-01-10 NOTE — Telephone Encounter (Signed)
Last filled 12/18/2019... note to pharmacy TBF on or after 01/17/2020... please advise

## 2020-01-11 ENCOUNTER — Encounter: Payer: Self-pay | Admitting: Internal Medicine

## 2020-01-11 DIAGNOSIS — G44019 Episodic cluster headache, not intractable: Secondary | ICD-10-CM

## 2020-01-14 NOTE — Addendum Note (Signed)
Addended by: Lorre Munroe on: 01/14/2020 05:48 PM   Modules accepted: Orders

## 2020-01-15 ENCOUNTER — Encounter: Payer: Self-pay | Admitting: Emergency Medicine

## 2020-01-15 ENCOUNTER — Other Ambulatory Visit: Payer: Self-pay

## 2020-01-15 ENCOUNTER — Ambulatory Visit
Admission: EM | Admit: 2020-01-15 | Discharge: 2020-01-15 | Disposition: A | Discharge status: Home / Self Care | Payer: BC Managed Care – PPO | Attending: Emergency Medicine | Admitting: Emergency Medicine

## 2020-01-15 DIAGNOSIS — B349 Viral infection, unspecified: Secondary | ICD-10-CM | POA: Diagnosis not present

## 2020-01-15 LAB — POC SARS CORONAVIRUS 2 AG -  ED: SARS Coronavirus 2 Ag: NEGATIVE

## 2020-01-15 LAB — POCT RAPID STREP A (OFFICE): Rapid Strep A Screen: NEGATIVE

## 2020-01-15 MED ORDER — IBUPROFEN 800 MG PO TABS: 800.0000 mg | ORAL_TABLET | Freq: Three times a day (TID) | ORAL | 0 refills | Status: DC | PRN

## 2020-01-15 NOTE — Discharge Instructions (Addendum)
Your rapid strep test is negative.  A throat culture is pending; we will call you if it is positive requiring treatment.    Your rapid COVID test is negative; the send-out test is pending.  You should self quarantine until your test result is back and is negative.    Go to the emergency department if you develop high fever, shortness of breath, severe diarrhea, or other concerning symptoms.    Your blood pressure is elevated today at 146/97.  Please have this rechecked by your primary care provider in 2-4 weeks.

## 2020-01-15 NOTE — ED Provider Notes (Signed)
Kathy Aguilar    CSN: [id number] Arrival date & time: 01/15/20  7829      History   Chief Complaint Chief Complaint  Patient presents with  . Otitis Media  . Sore Throat    HPI Kathy Aguilar is a 34 y.o. female.   Patient presents with bilateral ear pain and a sore throat since yesterday.  She also reports a low-grade fever.  T-max 99.9.  She denies rash, congestion, cough, shortness of breath, vomiting, diarrhea, or other symptoms.  Treatment at home with Tylenol.  The history is provided by the patient.    Past Medical History:  Diagnosis Date  . Asthma    Juvenille  . Family history of adverse reaction to anesthesia    mother has vomiting after anesthesia  . Family history of breast cancer 03/18/2017   Mom at age 59. States mom had negative genetic testing.   . Migraine without aura     There are no problems to display for this patient.   Past Surgical History:  Procedure Laterality Date  . BILATERAL SALPINGECTOMY Right 08/17/2016   Procedure: RIGHT SALPINGECTOMY;  Surgeon: Jerene Bears, MD;  Location: [facility];  Service: Gynecology;  Laterality: Right;  . CHROMOPERTUBATION Bilateral 08/17/2016   Procedure: CHROMOPERTUBATION;  Surgeon: Jerene Bears, MD;  Location: [facility];  Service: Gynecology;  Laterality: Bilateral;  . LAPAROSCOPIC APPENDECTOMY N/A 01/14/2016   Procedure: APPENDECTOMY LAPAROSCOPIC;  Surgeon: Lattie Haw, MD;  Location: [facility];  Service: General;  Laterality: N/A;  . LAPAROSCOPIC LYSIS OF ADHESIONS  08/17/2016   Procedure: LAPAROSCOPIC LYSIS OF ADHESIONS;  Surgeon: Jerene Bears, MD;  Location: [facility];  Service: Gynecology;;  . LAPAROSCOPY N/A 08/17/2016   Procedure: LAPAROSCOPY DIAGNOSTIC with left paratubal cystectomy;  Surgeon: Jerene Bears, MD;  Location: [facility];  Service: Gynecology;  Laterality: N/A;  . SALPINGECTOMY Right    SURGICAL ENTRY ABOVE IS INCORRECT    OB History    Gravida  3   Para  2   Term  2   Preterm  0     AB  1   Living  2     SAB  1   TAB  0   Ectopic  0   Multiple  0   Live Births  2            Home Medications    Prior to Admission medications   Medication Sig Start Date End Date Taking? Authorizing Provider  ibuprofen (ADVIL) 800 MG tablet Take 1 tablet (800 mg total) by mouth every 8 (eight) hours as needed. 01/15/20   Mickie Bail, NP  meloxicam (MOBIC) 15 MG tablet Take 15 mg by mouth as needed. 11/15/19   [provider]  norelgestromin-ethinyl estradiol [facility]r [facility]ico) 150-35 MCG/24HR transdermal patch Place 1 patch onto the skin once a week. 12/31/19   Jerene Bears, MD  SUMAtriptan (IMITREX) 25 MG tablet TAKE 1 TABLET BY MOUTH EVERY 2 HOURS AS NEEDED FOR MIGRAINE. MAY REPEAT IN 2 HOURS. 01/10/20   Lorre Munroe, NP    Family History Family History  Problem Relation Age of Onset  . Breast cancer Mother 67  . Osteoarthritis Maternal Grandmother   . Hypertension Maternal Grandmother   . Cancer Maternal Grandmother        blood cancer?  . Kidney disease Maternal Grandmother   . Cancer Maternal Grandfather 52       prostate cancer  . Kidney disease  Paternal Grandmother     Social History Social History   Tobacco Use  . Smoking status: Never Smoker  . Smokeless tobacco: Never Used  Substance Use Topics  . Alcohol use: No  . Drug use: No     Allergies   Patient has no known allergies.   Review of Systems Review of Systems  Constitutional: Negative for chills and fever.  HENT: Positive for ear pain and sore throat. Negative for congestion and trouble swallowing.   Eyes: Negative for pain and visual disturbance.  Respiratory: Negative for cough and shortness of breath.   Cardiovascular: Negative for chest pain and palpitations.  Gastrointestinal: Negative for abdominal pain, diarrhea, nausea and vomiting.  Genitourinary: Negative for dysuria and hematuria.  Musculoskeletal: Negative for arthralgias and back pain.  Skin: Negative for color  change and rash.  Neurological: Negative for seizures and syncope.  All other systems reviewed and are negative.    Physical Exam Triage Vital Signs ED Triage Vitals  Enc Vitals Group     BP      Pulse      Resp      Temp      Temp src      SpO2      Weight      Height      Head Circumference      Peak Flow      Pain Score      Pain Loc      Pain Edu?      Excl. in [place]?    No data found.  Updated Vital Signs BP (!) 146/97 (BP Location: Left Arm)   Pulse (!) 123   Temp 99.9 F (37.7 C) (Oral)   Resp 18   Wt 184 lb (83.5 kg)   LMP 01/09/2020   SpO2 97%   BMI 29.47 kg/m   Visual Acuity Right Eye Distance:   Left Eye Distance:   Bilateral Distance:    Right Eye Near:   Left Eye Near:    Bilateral Near:     Physical Exam Vitals and nursing note reviewed.  Constitutional:      General: She is not in acute distress.    Appearance: She is well-developed.  HENT:     Head: Normocephalic and atraumatic.     Right Ear: Tympanic membrane normal.     Left Ear: Tympanic membrane normal.     Nose: Nose normal.     Mouth/Throat:     Mouth: Mucous membranes are moist.     Pharynx: Posterior oropharyngeal erythema present. No oropharyngeal exudate.     Tonsils: 0 on the right. 0 on the left.  Eyes:     Conjunctiva/sclera: Conjunctivae normal.  Cardiovascular:     Rate and Rhythm: Normal rate and regular rhythm.     Heart sounds: No murmur.  Pulmonary:     Effort: Pulmonary effort is normal. No respiratory distress.     Breath sounds: Normal breath sounds.  Abdominal:     General: Bowel sounds are normal.     Palpations: Abdomen is soft.     Tenderness: There is no abdominal tenderness. There is no guarding or rebound.  Musculoskeletal:     Cervical back: Neck supple.  Skin:    General: Skin is warm and dry.     Findings: No rash.  Neurological:     General: No focal deficit present.     Mental Status: She is alert and oriented to person, place, and time.  Psychiatric:        Mood and Affect: Mood normal.        Behavior: Behavior normal.      UC Treatments / Results  Labs (all labs ordered are listed, but only abnormal results are displayed) Labs Reviewed  CULTURE, GROUP A STREP ([facility])  NOVEL CORONAVIRUS, NAA  POCT RAPID STREP A (OFFICE)  POC SARS CORONAVIRUS 2 AG -  ED    EKG   Radiology No results found.  Procedures Procedures (including critical care time)  Medications Ordered in UC Medications - No data to display  Initial Impression / Assessment and Plan / UC Course  I have reviewed the triage vital signs and the nursing notes.  Pertinent labs & imaging results that were available during my care of the patient were reviewed by me and considered in my medical decision making (see chart for details).   Viral Illness.  Rapid strep negative; throat culture pending.  POC COVID negative; PCR pending.  Instructed patient to self quarantine until the test result is back.  Treating with ibuprofen as needed for fever/discomfort.  Instructed patient to go to the emergency department if develops high fever, shortness of breath, severe diarrhea, or other concerning symptoms.  Patient agrees with plan of care.    Final Clinical Impressions(s) / UC Diagnoses   Final diagnoses:  Viral illness     Discharge Instructions     Your rapid strep test is negative.  A throat culture is pending; we will call you if it is positive requiring treatment.    Your rapid COVID test is negative; the send-out test is pending.  You should self quarantine until your test result is back and is negative.    Go to the emergency department if you develop high fever, shortness of breath, severe diarrhea, or other concerning symptoms.    Your blood pressure is elevated today at 146/97.  Please have this rechecked by your primary care provider in 2-4 weeks.         ED Prescriptions    Medication Sig Dispense Auth. Provider   ibuprofen (ADVIL)  800 MG tablet Take 1 tablet (800 mg total) by mouth every 8 (eight) hours as needed. 21 tablet Mickie Bail, NP     PDMP not reviewed this encounter.   Mickie Bail, NP 01/15/20 [phone]

## 2020-01-15 NOTE — ED Triage Notes (Signed)
Patient in office today c/o bilateral ear pain and sorethroat. On left side both hurts worse.  Sx started yesterday. Had low grade fever.  RWC:[id number]   Denies:N/v, dizziness

## 2020-01-17 LAB — CULTURE, GROUP A STREP (THRC)

## 2020-02-07 DIAGNOSIS — R519 Headache, unspecified: Secondary | ICD-10-CM | POA: Diagnosis not present

## 2020-02-16 DIAGNOSIS — R519 Headache, unspecified: Secondary | ICD-10-CM | POA: Insufficient documentation

## 2020-03-11 DIAGNOSIS — R519 Headache, unspecified: Secondary | ICD-10-CM | POA: Diagnosis not present

## 2020-03-26 ENCOUNTER — Ambulatory Visit
Admission: EM | Admit: 2020-03-26 | Discharge: 2020-03-26 | Disposition: A | Discharge status: Home / Self Care | Payer: BC Managed Care – PPO

## 2020-03-26 ENCOUNTER — Other Ambulatory Visit: Payer: Self-pay

## 2020-03-26 DIAGNOSIS — L239 Allergic contact dermatitis, unspecified cause: Secondary | ICD-10-CM | POA: Diagnosis not present

## 2020-03-26 MED ORDER — PREDNISONE 10 MG (21) PO TBPK: ORAL_TABLET | Freq: Every day | ORAL | 0 refills | Status: DC

## 2020-03-26 NOTE — ED Provider Notes (Signed)
Kathy Aguilar    CSN: [id number] Arrival date & time: 03/26/20  3419      History   Chief Complaint Chief Complaint  Patient presents with  . Rash    HPI Kathy Aguilar is a 34 y.o. female.   Patient presents with 1 day history of pruritic rash on extremities and abdomen.  No difficulty swallowing or breathing.  She took a dose of sumatriptan on 03/24/2020; this is only the second time she has taken this medication.  She denies other new medications, products, soaps, lotions, foods.  No one else at home with rash.  She denies fever, chills, sore throat, cough, shortness of breath, vomiting, diarrhea, or other symptoms.  No treatment attempted at home.  The history is provided by the patient.    Past Medical History:  Diagnosis Date  . Asthma    Juvenille  . Family history of adverse reaction to anesthesia    mother has vomiting after anesthesia  . Family history of breast cancer 03/18/2017   Mom at age 71. States mom had negative genetic testing.   . Migraine without aura     There are no problems to display for this patient.   Past Surgical History:  Procedure Laterality Date  . BILATERAL SALPINGECTOMY Right 08/17/2016   Procedure: RIGHT SALPINGECTOMY;  Surgeon: Jerene Bears, MD;  Location: [facility];  Service: Gynecology;  Laterality: Right;  . CHROMOPERTUBATION Bilateral 08/17/2016   Procedure: CHROMOPERTUBATION;  Surgeon: Jerene Bears, MD;  Location: [facility];  Service: Gynecology;  Laterality: Bilateral;  . LAPAROSCOPIC APPENDECTOMY N/A 01/14/2016   Procedure: APPENDECTOMY LAPAROSCOPIC;  Surgeon: Lattie Haw, MD;  Location: [facility];  Service: General;  Laterality: N/A;  . LAPAROSCOPIC LYSIS OF ADHESIONS  08/17/2016   Procedure: LAPAROSCOPIC LYSIS OF ADHESIONS;  Surgeon: Jerene Bears, MD;  Location: [facility];  Service: Gynecology;;  . LAPAROSCOPY N/A 08/17/2016   Procedure: LAPAROSCOPY DIAGNOSTIC with left paratubal cystectomy;  Surgeon: Jerene Bears, MD;   Location: [facility];  Service: Gynecology;  Laterality: N/A;  . SALPINGECTOMY Right    SURGICAL ENTRY ABOVE IS INCORRECT    OB History    Gravida  3   Para  2   Term  2   Preterm  0   AB  1   Living  2     SAB  1   TAB  0   Ectopic  0   Multiple  0   Live Births  2            Home Medications    Prior to Admission medications   Medication Sig Start Date End Date Taking? Authorizing Provider  ibuprofen (ADVIL) 800 MG tablet Take 1 tablet (800 mg total) by mouth every 8 (eight) hours as needed. 01/15/20   Mickie Bail, NP  meloxicam (MOBIC) 15 MG tablet Take 15 mg by mouth as needed. 11/15/19   [provider]  norelgestromin-ethinyl estradiol [facility]) 150-35 MCG/24HR transdermal patch Place 1 patch onto the skin once a week. 12/31/19   Jerene Bears, MD  predniSONE (STERAPRED UNI-PAK 21 TAB) 10 MG (21) TBPK tablet Take by mouth daily. Take 6 tabs by mouth daily  for 1 day, then 5 tabs for 1 day, then 4 tabs for 1 day, then 3 tabs for 1 day, 2 tabs for 1 day, then 1 tab by mouth daily for 1 day 03/26/20   Mickie Bail, NP  Prenatal MV-Min-Fe Fum-FA-DHA (PRENATAL  1 PO) Take 1 tablet by mouth daily.    [provider]  SUMAtriptan (IMITREX) 25 MG tablet TAKE 1 TABLET BY MOUTH EVERY 2 HOURS AS NEEDED FOR MIGRAINE. MAY REPEAT IN 2 HOURS. 01/10/20   Lorre Munroe, NP  verapamil (CALAN) 80 MG tablet Take 80 mg by mouth 3 (three) times daily. 03/11/20   [provider]    Family History Family History  Problem Relation Age of Onset  . Breast cancer Mother 68  . Osteoarthritis Maternal Grandmother   . Hypertension Maternal Grandmother   . Cancer Maternal Grandmother        blood cancer?  . Kidney disease Maternal Grandmother   . Cancer Maternal Grandfather 7       prostate cancer  . Kidney disease Paternal Grandmother     Social History Social History   Tobacco Use  . Smoking status: Never Smoker  . Smokeless tobacco: Never Used  Vaping  Use  . Vaping Use: Never used  Substance Use Topics  . Alcohol use: No  . Drug use: No     Allergies   Patient has no known allergies.   Review of Systems Review of Systems  Constitutional: Negative for chills and fever.  HENT: Negative for ear pain and sore throat.   Eyes: Negative for pain and visual disturbance.  Respiratory: Negative for cough and shortness of breath.   Cardiovascular: Negative for chest pain and palpitations.  Gastrointestinal: Negative for abdominal pain and vomiting.  Genitourinary: Negative for dysuria and hematuria.  Musculoskeletal: Negative for arthralgias and back pain.  Skin: Positive for rash. Negative for color change.  Neurological: Negative for seizures and syncope.  All other systems reviewed and are negative.    Physical Exam Triage Vital Signs ED Triage Vitals  Enc Vitals Group     BP      Pulse      Resp      Temp      Temp src      SpO2      Weight      Height      Head Circumference      Peak Flow      Pain Score      Pain Loc      Pain Edu?      Excl. in GC?    No data found.  Updated Vital Signs BP (!) 142/91 (BP Location: Left Arm)   Pulse 87   Temp 98.7 F (37.1 C) (Oral)   Resp 18   Wt 184 lb (83.5 kg)   LMP 03/09/2020 (Exact Date)   SpO2 100%   BMI 29.47 kg/m   Visual Acuity Right Eye Distance:   Left Eye Distance:   Bilateral Distance:    Right Eye Near:   Left Eye Near:    Bilateral Near:     Physical Exam Vitals and nursing note reviewed.  Constitutional:      General: She is not in acute distress.    Appearance: She is well-developed.  HENT:     Head: Normocephalic and atraumatic.     Mouth/Throat:     Mouth: Mucous membranes are moist.     Pharynx: Oropharynx is clear.  Eyes:     Conjunctiva/sclera: Conjunctivae normal.  Cardiovascular:     Rate and Rhythm: Normal rate and regular rhythm.     Heart sounds: No murmur heard.   Pulmonary:     Effort: Pulmonary effort is normal. No  respiratory distress.  Breath sounds: Normal breath sounds.  Abdominal:     Palpations: Abdomen is soft.     Tenderness: There is no abdominal tenderness. There is no guarding or rebound.  Musculoskeletal:     Cervical back: Neck supple.  Skin:    General: Skin is warm and dry.     Findings: Rash present.     Comments: Erythematous papular rash on extremities and lower abdomen.  No drainage.  No burrowing.  See pictures for details.    Neurological:     General: No focal deficit present.     Mental Status: She is alert and oriented to person, place, and time.     Gait: Gait normal.  Psychiatric:        Mood and Affect: Mood normal.        Behavior: Behavior normal.            UC Treatments / Results  Labs (all labs ordered are listed, but only abnormal results are displayed) Labs Reviewed - No data to display  EKG   Radiology No results found.  Procedures Procedures (including critical care time)  Medications Ordered in UC Medications - No data to display  Initial Impression / Assessment and Plan / UC Course  I have reviewed the triage vital signs and the nursing notes.  Pertinent labs & imaging results that were available during my care of the patient were reviewed by me and considered in my medical decision making (see chart for details).   Allergic dermatitis.  Instructed patient to go to the ED if she has difficulty swallowing or breathing.  Instructed her to stop the sumatriptan hand.  Treating with Benadryl and prednisone.  Instructed patient to follow-up with her PCP if her symptoms are not improving and to discuss this possible allergic reaction to sumatriptan with her PCP.  Patient agrees to plan of care.     Final Clinical Impressions(s) / UC Diagnoses   Final diagnoses:  Allergic dermatitis     Discharge Instructions     Go to the Emergency Department if you have difficulty swallowing or breathing.   Stop taking the sumatriptan.    Take  Benadryl 25-50 mg by mouth every 6 hours while awake x 2-3 days.    Take the prednisone as directed.    Follow up with your primary care provider if your symptoms are not improving.  Be sure to let them know about this may be an allergic reaction to your medication.         ED Prescriptions    Medication Sig Dispense Auth. Provider   predniSONE (STERAPRED UNI-PAK 21 TAB) 10 MG (21) TBPK tablet Take by mouth daily. Take 6 tabs by mouth daily  for 1 day, then 5 tabs for 1 day, then 4 tabs for 1 day, then 3 tabs for 1 day, 2 tabs for 1 day, then 1 tab by mouth daily for 1 day 21 tablet Sharion Balloon, NP     PDMP not reviewed this encounter.   Sharion Balloon, NP 03/26/20 [phone]

## 2020-03-26 NOTE — ED Triage Notes (Addendum)
Pt is here with a rash that has spread all over her body & started 1 day ago, pt has not taken anything to relieve discomfort.

## 2020-03-26 NOTE — Discharge Instructions (Signed)
Go to the Emergency Department if you have difficulty swallowing or breathing.   Stop taking the sumatriptan.    Take Benadryl 25-50 mg by mouth every 6 hours while awake x 2-3 days.    Take the prednisone as directed.    Follow up with your primary care provider if your symptoms are not improving.  Be sure to let them know about this may be an allergic reaction to your medication.

## 2020-04-22 DIAGNOSIS — R519 Headache, unspecified: Secondary | ICD-10-CM | POA: Diagnosis not present

## 2020-07-04 ENCOUNTER — Telehealth: Payer: Self-pay | Admitting: Obstetrics & Gynecology

## 2020-07-04 NOTE — Telephone Encounter (Signed)
Appointment Request From: Zane Herald    With Provider: Jerene Bears, MD [facility]]    Preferred Date Range: 07/07/2020 - 07/23/2020    Preferred Times: Any Time    Reason for visit: Office Visit    Comments:  Confirm pregnancy. Positive at home test Thursday 07/03/20. Would also like to request a blood test due to previous mischarge. Midday 10/13 would work best if available.

## 2020-07-04 NOTE — Telephone Encounter (Signed)
Spoke with patient.  LMP 06/04/20 UPT positive 07/03/20 Denies vaginal bleeding, pain, N/V, fever/chills.  Hx of miscarriage. Rh/ABO: A neg Patient is requesting to schedule an appt for pregnancy confirmation.  OV scheduled for 9/28 at 2pm w/ Dr. Hyacinth Meeker. Patient declines earlier appt.  MAU precautions reviewed for pain/bleeding.  Patient verbalizes understanding and is agreeable.   Routing to provider for final review. Patient is agreeable to disposition. Will close encounter.

## 2020-07-07 NOTE — Progress Notes (Signed)
GYNECOLOGY  VISIT  CC:   Confirmation of pregnancy  HPI: 34 y.o. G65P2012 Married White or Caucasian female here for pregnancy confirmation.  By LMP she is 4 6/7 days and EDC would be 03/11/2021.  She is having some mild breast tenderness.  She's already starting having nausea.  MBT is A neg.  Pt aware of need for Rhogam with bleeding.    She stopped the verapamil a week ago.  So far, she hasn't had any headaches.  She will reach out to her neurologist about this as was being prescribed for cluster headaches.  Hopefully, pregnancy will help and headaches will not be as bad.  Denies vaginal bleeding and pelvic pain.    GYNECOLOGIC HISTORY: Patient's last menstrual period was 06/04/2020 (exact date). Contraception: none Menopausal hormone therapy: none  There are no problems to display for this patient.   Past Medical History:  Diagnosis Date  . Asthma    Juvenille  . Family history of adverse reaction to anesthesia    mother has vomiting after anesthesia  . Family history of breast cancer 03/18/2017   Mom at age 72. States mom had negative genetic testing.   . Migraine without aura     Past Surgical History:  Procedure Laterality Date  . BILATERAL SALPINGECTOMY Right 08/17/2016   Procedure: RIGHT SALPINGECTOMY;  Surgeon: Jerene Bears, MD;  Location: [facility];  Service: Gynecology;  Laterality: Right;  . CHROMOPERTUBATION Bilateral 08/17/2016   Procedure: CHROMOPERTUBATION;  Surgeon: Jerene Bears, MD;  Location: [facility];  Service: Gynecology;  Laterality: Bilateral;  . LAPAROSCOPIC APPENDECTOMY N/A 01/14/2016   Procedure: APPENDECTOMY LAPAROSCOPIC;  Surgeon: Lattie Haw, MD;  Location: [facility];  Service: General;  Laterality: N/A;  . LAPAROSCOPIC LYSIS OF ADHESIONS  08/17/2016   Procedure: LAPAROSCOPIC LYSIS OF ADHESIONS;  Surgeon: Jerene Bears, MD;  Location: [facility];  Service: Gynecology;;  . LAPAROSCOPY N/A 08/17/2016   Procedure: LAPAROSCOPY DIAGNOSTIC with left paratubal  cystectomy;  Surgeon: Jerene Bears, MD;  Location: [facility];  Service: Gynecology;  Laterality: N/A;  . SALPINGECTOMY Right    SURGICAL ENTRY ABOVE IS INCORRECT    MEDS:   Current Outpatient Medications on File Prior to Visit  Medication Sig Dispense Refill  . Prenatal MV-Min-Fe Fum-FA-DHA (PRENATAL 1 PO) Take 1 tablet by mouth daily.     No current facility-administered medications on file prior to visit.    ALLERGIES: Sumatriptan  Family History  Problem Relation Age of Onset  . Breast cancer Mother 77  . Osteoarthritis Maternal Grandmother   . Hypertension Maternal Grandmother   . Cancer Maternal Grandmother        blood cancer?  . Kidney disease Maternal Grandmother   . Cancer Maternal Grandfather 25       prostate cancer  . Kidney disease Paternal Grandmother     SH:  Married, non smoker  Review of Systems  All other systems reviewed and are negative.   PHYSICAL EXAMINATION:    BP 110/70   Pulse 70   Resp 16   Wt 192 lb (87.1 kg)   LMP 06/04/2020 (Exact Date)   BMI 30.76 kg/m     General appearance: alert, cooperative and appears stated age  Assessment: Amenorrhea with positive UPT Anxiety about pregnancy  Plan: HCG will be obtained today and then again in 48 hours.  Lab orders placed.   Viability ultrasound will be planned in 2 weeks if levels increasing.  Pt desires to do this prior  to transfer of care.   21 minutes of total time was spent for this patient encounter, including preparation, face-to-face counseling with the patient and coordination of care, and documentation of the encounter.

## 2020-07-08 ENCOUNTER — Other Ambulatory Visit: Payer: Self-pay

## 2020-07-08 ENCOUNTER — Encounter: Payer: Self-pay | Admitting: Obstetrics & Gynecology

## 2020-07-08 ENCOUNTER — Ambulatory Visit: Payer: BC Managed Care – PPO | Admitting: Obstetrics & Gynecology

## 2020-07-08 VITALS — BP 110/70 | HR 70 | Resp 16 | Wt 192.0 lb

## 2020-07-08 DIAGNOSIS — N912 Amenorrhea, unspecified: Secondary | ICD-10-CM

## 2020-07-08 LAB — POCT URINE PREGNANCY: Preg Test, Ur: POSITIVE — AB

## 2020-07-09 ENCOUNTER — Telehealth: Payer: Self-pay

## 2020-07-09 LAB — BETA HCG QUANT (REF LAB): hCG Quant: 6598 m[IU]/mL

## 2020-07-09 NOTE — Telephone Encounter (Signed)
Per review of Epic, patient is scheduled for lab appt on 07/10/20 at 3:15pm for repeat beta hcg.

## 2020-07-09 NOTE — Telephone Encounter (Signed)
Received lab results of Beta HCG from 07/08/20 Results: [id number]  Routing to Dr Hyacinth Meeker for review

## 2020-07-10 ENCOUNTER — Other Ambulatory Visit (INDEPENDENT_AMBULATORY_CARE_PROVIDER_SITE_OTHER): Payer: BC Managed Care – PPO

## 2020-07-10 ENCOUNTER — Other Ambulatory Visit: Payer: Self-pay

## 2020-07-10 DIAGNOSIS — N912 Amenorrhea, unspecified: Secondary | ICD-10-CM

## 2020-07-11 ENCOUNTER — Other Ambulatory Visit: Payer: Self-pay

## 2020-07-11 DIAGNOSIS — Z3201 Encounter for pregnancy test, result positive: Secondary | ICD-10-CM

## 2020-07-11 LAB — BETA HCG QUANT (REF LAB): hCG Quant: 17673 m[IU]/mL

## 2020-07-11 NOTE — Progress Notes (Signed)
Viability PUS orders placed per Dr Hyacinth Meeker.  Pt scheduled for 07/24/20 at 130 pm.  Cc: Hedda Slade for precert  Encounter closed

## 2020-07-14 ENCOUNTER — Telehealth: Payer: Self-pay

## 2020-07-14 NOTE — Telephone Encounter (Signed)
Yes, that is fine.  Thanks for the update.

## 2020-07-14 NOTE — Telephone Encounter (Signed)
Spoke with patient regarding benefits for scheduled ultrasound. Patient acknowledges understanding of information presented. Patient wishes to cancel PUS at this time and will contact her OB regarding ultrasound scheduling.  Dr. Hyacinth Meeker, OK to cancel PUS order?

## 2020-07-24 ENCOUNTER — Other Ambulatory Visit: Payer: Self-pay

## 2020-07-24 ENCOUNTER — Other Ambulatory Visit: Payer: Self-pay | Admitting: Obstetrics & Gynecology

## 2020-08-13 ENCOUNTER — Encounter: Payer: Self-pay | Admitting: Family Medicine

## 2020-08-13 ENCOUNTER — Ambulatory Visit (INDEPENDENT_AMBULATORY_CARE_PROVIDER_SITE_OTHER): Payer: BC Managed Care – PPO | Admitting: Family Medicine

## 2020-08-13 ENCOUNTER — Other Ambulatory Visit (HOSPITAL_COMMUNITY)
Admission: RE | Admit: 2020-08-13 | Discharge: 2020-08-13 | Disposition: A | Discharge status: Home / Self Care | Payer: BC Managed Care – PPO | Source: Ambulatory Visit | Attending: Family Medicine | Admitting: Family Medicine

## 2020-08-13 ENCOUNTER — Other Ambulatory Visit: Payer: Self-pay

## 2020-08-13 VITALS — BP 128/85 | HR 106 | Wt 196.0 lb

## 2020-08-13 DIAGNOSIS — O0990 Supervision of high risk pregnancy, unspecified, unspecified trimester: Secondary | ICD-10-CM | POA: Insufficient documentation

## 2020-08-13 DIAGNOSIS — Z3A10 10 weeks gestation of pregnancy: Secondary | ICD-10-CM

## 2020-08-13 DIAGNOSIS — O99210 Obesity complicating pregnancy, unspecified trimester: Secondary | ICD-10-CM | POA: Diagnosis not present

## 2020-08-13 DIAGNOSIS — Z3480 Encounter for supervision of other normal pregnancy, unspecified trimester: Secondary | ICD-10-CM

## 2020-08-13 DIAGNOSIS — O09511 Supervision of elderly primigravida, first trimester: Secondary | ICD-10-CM | POA: Diagnosis not present

## 2020-08-13 DIAGNOSIS — O09521 Supervision of elderly multigravida, first trimester: Secondary | ICD-10-CM

## 2020-08-13 DIAGNOSIS — O09522 Supervision of elderly multigravida, second trimester: Secondary | ICD-10-CM | POA: Insufficient documentation

## 2020-08-13 MED ORDER — CYCLOBENZAPRINE HCL 10 MG PO TABS: 10.0000 mg | ORAL_TABLET | Freq: Three times a day (TID) | ORAL | 1 refills | Status: DC | PRN

## 2020-08-13 NOTE — Progress Notes (Signed)
DATING AND VIABILITY SONOGRAM   Kathy Aguilar is a 34 y.o. year old G69P2012 with LMP Patient's last menstrual period was 06/04/2020 (exact date). which would correlate to  [redacted]w[redacted]d weeks gestation.  She has regular menstrual cycles.   She is here today for a confirmatory initial sonogram.    GESTATION: SINGLETON yes     FETAL ACTIVITY:          Heart rate      175          The fetus is active.    GESTATIONAL AGE AND  BIOMETRICS:  Gestational criteria: Estimated Date of Delivery: 03/11/21 by LMP now at [redacted]w[redacted]d  Previous Scans:0  GESTATIONAL SAC            mm         weeks  CROWN RUMP LENGTH           3.39cm         10.2 weeks                                                   AVERAGE EGA(BY THIS SCAN):  10.2 weeks  WORKING EDD( LMP ):  03/11/2021     TECHNICIAN COMMENTS: Patient informed that the ultrasound is considered a limited obstetric ultrasound and is not intended to be a complete ultrasound exam. Patient also informed that the ultrasound is not being completed with the intent of assessing for fetal or placental anomalies or any pelvic abnormalities. Explained that the purpose of today's ultrasound is to assess for fetal heart rate. Patient acknowledges the purpose of the exam and the limitations of the study.        A copy of this report including all images has been saved and backed up to a second source for retrieval if needed. All measures and details of the anatomical scan, placentation, fluid volume and pelvic anatomy are contained in that report.  Scheryl Marten 08/13/2020 3:08 PM

## 2020-08-13 NOTE — Progress Notes (Signed)
INITIAL PRENATAL VISIT  Subjective:   Kathy Aguilar is being seen today for her first obstetrical visit.  This is a planned pregnancy. This is a desired pregnancy.  She is at [redacted]w[redacted]d gestation by LMP and [place] in office today.  Her obstetrical history is significant for advanced maternal age. Relationship with FOB: spouse, living together. Patient does intend to breast feed. Pregnancy history fully reviewed.  Patient reports no complaints.  Indications for ASA therapy (per uptodate) One of the following: Previous pregnancy with preeclampsia, especially early onset and with an adverse outcome No Multifetal gestation No Chronic hypertension No Type 1 or 2 diabetes mellitus No Chronic kidney disease No Autoimmune disease (antiphospholipid syndrome, systemic lupus erythematosus) No  Two or more of the following: Nulliparity No Obesity (body mass index >30 kg/m2) Yes Family history of preeclampsia in mother or sister No Age ?35 years Yes Sociodemographic characteristics (African American race, low socioeconomic level) No Personal risk factors (eg, previous pregnancy with low birth weight or small for gestational age infant, previous adverse pregnancy outcome [eg, stillbirth], interval >10 years between pregnancies) No  Indications for early GDM screening  First-degree relative with diabetes No BMI >30kg/m2 Yes Age > 25 Yes Previous birth of an infant weighing ?4000 g No Gestational diabetes mellitus in a previous pregnancy No Glycated hemoglobin ?5.7 percent (39 mmol/mol), impaired glucose tolerance, or impaired fasting glucose on previous testing No High-risk race/ethnicity (eg, African American, Latino, Native American, Asian American, Pacific Islander) No Previous stillbirth of unknown cause No Maternal birthweight > 9 lbs No History of cardiovascular disease No Hypertension or on therapy for hypertension No High-density lipoprotein cholesterol level <35 mg/dL (7.41 mmol/L) and/or  a triglyceride level >250 mg/dL (2.87 mmol/L) No Polycystic ovary syndrome No Physical inactivity No Other clinical condition associated with insulin resistance (eg, severe obesity, acanthosis nigricans) No Current use of glucocorticoids No   Early screening tests: FBS, A1C, Random CBG, glucose challenge   Review of Systems:   Review of Systems  Objective:    Obstetric History OB History  Gravida Para Term Preterm AB Living  4 2 2  0 1 2  SAB TAB Ectopic Multiple Live Births  1 0 0 0 2    # Outcome Date GA Lbr Len/2nd Weight Sex Delivery Anes PTL Lv  4 Current           3 Term 10/01/17 [redacted]w[redacted]d 29:35 / 00:39 8 lb 14.2 oz (4.03 kg) M Vag-Spont EPI  LIV  2 SAB 10/2016          1 Term 10/18/05   7 lb (3.175 kg) F Vag-Spont  N LIV    Past Medical History:  Diagnosis Date  . Asthma    Juvenille  . Family history of adverse reaction to anesthesia    mother has vomiting after anesthesia  . Family history of breast cancer 03/18/2017   Mom at age 16. States mom had negative genetic testing.   . Migraine without aura     Past Surgical History:  Procedure Laterality Date  . BILATERAL SALPINGECTOMY Right 08/17/2016   Procedure: RIGHT SALPINGECTOMY;  Surgeon: 13/04/2016, MD;  Location: [facility];  Service: Gynecology;  Laterality: Right;  . CHROMOPERTUBATION Bilateral 08/17/2016   Procedure: CHROMOPERTUBATION;  Surgeon: 13/04/2016, MD;  Location: [facility];  Service: Gynecology;  Laterality: Bilateral;  . LAPAROSCOPIC APPENDECTOMY N/A 01/14/2016   Procedure: APPENDECTOMY LAPAROSCOPIC;  Surgeon: 03/15/2016, MD;  Location: [facility];  Service: General;  Laterality: N/A;  . LAPAROSCOPIC LYSIS OF ADHESIONS  08/17/2016   Procedure: LAPAROSCOPIC LYSIS OF ADHESIONS;  Surgeon: Jerene Bears, MD;  Location: [facility];  Service: Gynecology;;  . LAPAROSCOPY N/A 08/17/2016   Procedure: LAPAROSCOPY DIAGNOSTIC with left paratubal cystectomy;  Surgeon: Jerene Bears, MD;  Location: [facility];  Service:  Gynecology;  Laterality: N/A;  . SALPINGECTOMY Right    SURGICAL ENTRY ABOVE IS INCORRECT    Current Outpatient Medications on File Prior to Visit  Medication Sig Dispense Refill  . Prenatal MV-Min-Fe Fum-FA-DHA (PRENATAL 1 PO) Take 1 tablet by mouth daily.     No current facility-administered medications on file prior to visit.    Allergies  Allergen Reactions  . Sumatriptan Rash    Social History:  reports that she has never smoked. She has never used smokeless tobacco. She reports that she does not drink alcohol and does not use drugs.  Family History  Problem Relation Age of Onset  . Breast cancer Mother 49  . Osteoarthritis Maternal Grandmother   . Hypertension Maternal Grandmother   . Cancer Maternal Grandmother        blood cancer?  . Kidney disease Maternal Grandmother   . Cancer Maternal Grandfather 9       prostate cancer  . Kidney disease Paternal Grandmother     The following portions of the patient's history were reviewed and updated as appropriate: allergies, current medications, past family history, past medical history, past social history, past surgical history and problem list.  Review of Systems Review of Systems    Physical Exam:  Pulse (!) 106   Wt 196 lb (88.9 kg)   LMP 06/04/2020 (Exact Date)   BMI 31.40 kg/m  CONSTITUTIONAL: Well-developed, well-nourished female in no acute distress.  HENT:  Normocephalic, atraumatic, External right and left ear normal. Oropharynx is clear and moist EYES: Conjunctivae normal. No scleral icterus.  NECK: Normal range of motion, supple, no masses.  Normal thyroid.  SKIN: Skin is warm and dry. No rash noted. Not diaphoretic. No erythema. No pallor. MUSCULOSKELETAL: Normal range of motion. No tenderness.  No cyanosis, clubbing, or edema.   NEUROLOGIC: Alert and oriented to person, place, and time. Normal muscle tone coordination.  PSYCHIATRIC: Normal mood and affect. Normal behavior. Normal judgment and thought  content. CARDIOVASCULAR: Normal heart rate noted, regular rhythm RESPIRATORY: Clear to auscultation bilaterally. Effort and breath sounds normal, no problems with respiration noted. BREASTS: Symmetric in size. No masses, skin changes, nipple drainage, or lymphadenopathy. ABDOMEN: Soft, normal bowel sounds, no distention noted.  No tenderness, rebound or guarding. Fundal ht: below pelvic rim PELVIC: deferred FHR: 175   Assessment:    New OB visit, high risk pregnancy due to maternity age and weight    Plan:    Pregnancy: [id number]  1. Supervision of other normal pregnancy, antepartum Reviewed practice model with CNM, FMOB, OB GYN and learners (MD, CNM) - CBC/D/Plt+RPR+Rh+ABO+Rub Ab... - Culture, OB Urine - GC/Chlamydia probe amp ([place])not at [facility] - [place] MFM OB DETAIL +14 WK; Future - cyclobenzaprine (FLEXERIL) 10 MG tablet; Take 1 tablet (10 mg total) by mouth every 8 (eight) hours as needed for muscle spasms.  Dispense: 30 tablet; Refill: 1 - Genetic Screening  2. Multigravida of advanced maternal age in first trimester Requests genetic screening - Genetic Screening  3. Obesity affecting pregnancy, antepartum Reviewed weight gain of 11-15 lbs this pregnancy   Initial labs drawn. Prenatal vitamins. Problem list reviewed and updated. Reviewed in detail  the nature of the practice with collaborative care between  Genetic screening discussed: NIPS/First trimester screen/Quad/AFP requested. Role of ultrasound in pregnancy discussed; Anatomy US: requested. Amniocentesis discussed: not indicated. Follow up in 4 weeks. Discussed clinic routines, schedule of care and testing, genetic screening options, involvement of students and residents under the direct supervision of APPs and doctors and presence of female providers. Pt verbalized understanding.  Future Appointments  Date Time Provider Department Center  09/11/2020  4:00 PM Reva Bores, MD [facility]  10/15/2020   2:00 PM [facility] NURSE [facility]  10/15/2020  2:15 PM [facility] US2 [facility]  04/03/2021  2:30 PM [facility] PROVIDER [facility] None    Federico Flake, MD 08/13/2020 3:48 PM

## 2020-08-14 LAB — CBC/D/PLT+RPR+RH+ABO+RUB AB...
Antibody Screen: NEGATIVE
Basophils Absolute: 0.1 10*3/uL (ref 0.0–0.2)
Basos: 0 %
EOS (ABSOLUTE): 0.2 10*3/uL (ref 0.0–0.4)
Eos: 1 %
HCV Ab: 0.1 s/co ratio (ref 0.0–0.9)
HIV Screen 4th Generation wRfx: NONREACTIVE
Hematocrit: 41.1 % (ref 34.0–46.6)
Hemoglobin: 13.7 g/dL (ref 11.1–15.9)
Hepatitis B Surface Ag: NEGATIVE
Immature Grans (Abs): 0 10*3/uL (ref 0.0–0.1)
Immature Granulocytes: 0 %
Lymphocytes Absolute: 3 10*3/uL (ref 0.7–3.1)
Lymphs: 25 %
MCH: 29.5 pg (ref 26.6–33.0)
MCHC: 33.3 g/dL (ref 31.5–35.7)
MCV: 89 fL (ref 79–97)
Monocytes Absolute: 0.8 10*3/uL (ref 0.1–0.9)
Monocytes: 6 %
Neutrophils Absolute: 8 10*3/uL — ABNORMAL HIGH (ref 1.4–7.0)
Neutrophils: 68 %
Platelets: 302 10*3/uL (ref 150–450)
RBC: 4.64 x10E6/uL (ref 3.77–5.28)
RDW: 12.9 % (ref 11.7–15.4)
RPR Ser Ql: NONREACTIVE
Rh Factor: NEGATIVE
Rubella Antibodies, IGG: 1.32 index (ref >0.99)
WBC: 12 10*3/uL — ABNORMAL HIGH (ref 3.4–10.8)

## 2020-08-14 LAB — HCV INTERPRETATION

## 2020-08-15 LAB — CULTURE, OB URINE

## 2020-08-15 LAB — GC/CHLAMYDIA PROBE AMP (~~LOC~~) NOT AT ARMC
Chlamydia: NEGATIVE
Comment: NEGATIVE
Comment: NORMAL
Neisseria Gonorrhea: NEGATIVE

## 2020-08-15 LAB — URINE CULTURE, OB REFLEX

## 2020-08-19 ENCOUNTER — Telehealth: Payer: Self-pay | Admitting: Radiology

## 2020-08-19 ENCOUNTER — Encounter: Payer: Self-pay | Admitting: Radiology

## 2020-08-19 NOTE — Telephone Encounter (Signed)
Patient informed of Panorama results, did not want to know fetal sex

## 2020-08-19 NOTE — Telephone Encounter (Signed)
Left message for Patient to call [facility] for Panorama results

## 2020-09-11 ENCOUNTER — Other Ambulatory Visit: Payer: Self-pay

## 2020-09-11 ENCOUNTER — Ambulatory Visit (INDEPENDENT_AMBULATORY_CARE_PROVIDER_SITE_OTHER): Payer: BC Managed Care – PPO | Admitting: Family Medicine

## 2020-09-11 VITALS — BP 131/84 | HR 96 | Wt 195.0 lb

## 2020-09-11 DIAGNOSIS — Z3480 Encounter for supervision of other normal pregnancy, unspecified trimester: Secondary | ICD-10-CM

## 2020-09-11 DIAGNOSIS — O09521 Supervision of elderly multigravida, first trimester: Secondary | ICD-10-CM

## 2020-09-11 DIAGNOSIS — Z23 Encounter for immunization: Secondary | ICD-10-CM

## 2020-09-11 NOTE — Progress Notes (Signed)
   PRENATAL VISIT NOTE  Subjective:  Kathy Aguilar is a 34 y.o. O1Y0737 at [redacted]w[redacted]d being seen today for ongoing prenatal care.  She is currently monitored for the following issues for this low-risk pregnancy and has Rh negative state in antepartum period; Headache disorder; Supervision of other normal pregnancy, antepartum; and Multigravida of advanced maternal age in first trimester on their problem list.  Patient reports no complaints.  Contractions: Not present. Vag. Bleeding: None.   . Denies leaking of fluid.   The following portions of the patient's history were reviewed and updated as appropriate: allergies, current medications, past family history, past medical history, past social history, past surgical history and problem list.   Objective:   Vitals:   09/11/20 1612  BP: 131/84  Pulse: 96  Weight: 195 lb (88.5 kg)    Fetal Status: Fetal Heart Rate (bpm): 146         General:  Alert, oriented and cooperative. Patient is in no acute distress.  Skin: Skin is warm and dry. No rash noted.   Cardiovascular: Normal heart rate noted  Respiratory: Normal respiratory effort, no problems with respiration noted  Abdomen: Soft, gravid, appropriate for gestational age.  Pain/Pressure: Absent     Pelvic: Cervical exam deferred        Extremities: Normal range of motion.  Edema: None  Mental Status: Normal mood and affect. Normal behavior. Normal judgment and thought content.   Assessment and Plan:  Pregnancy: [id number] at [redacted]w[redacted]d 1. Supervision of other normal pregnancy, antepartum Continue routine prenatal care. Having no symptoms - feels great Has anatomy u/s scheduled.  2. Multigravida of advanced maternal age in first trimester Low risk NIPT Detailed anatomy  General obstetric precautions including but not limited to vaginal bleeding, contractions, leaking of fluid and fetal movement were reviewed in detail with the patient. Please refer to After Visit Summary for other  counseling recommendations.   Return in 4 weeks (on 10/09/2020).  Future Appointments  Date Time Provider Department Center  10/09/2020  4:00 PM Tereso Newcomer, MD [facility]  10/15/2020  2:00 PM [facility] NURSE [facility]  10/15/2020  2:15 PM [facility] US2 [facility]  04/03/2021  2:30 PM [facility] PROVIDER [facility] None    Reva Bores, MD

## 2020-09-11 NOTE — Patient Instructions (Signed)

## 2020-10-09 ENCOUNTER — Ambulatory Visit (INDEPENDENT_AMBULATORY_CARE_PROVIDER_SITE_OTHER): Payer: BC Managed Care – PPO | Admitting: Family Medicine

## 2020-10-09 ENCOUNTER — Other Ambulatory Visit: Payer: Self-pay

## 2020-10-09 VITALS — BP 113/76 | HR 84 | Wt 195.0 lb

## 2020-10-09 DIAGNOSIS — O09521 Supervision of elderly multigravida, first trimester: Secondary | ICD-10-CM

## 2020-10-09 DIAGNOSIS — Z3A18 18 weeks gestation of pregnancy: Secondary | ICD-10-CM

## 2020-10-09 DIAGNOSIS — O26899 Other specified pregnancy related conditions, unspecified trimester: Secondary | ICD-10-CM

## 2020-10-09 DIAGNOSIS — Z3480 Encounter for supervision of other normal pregnancy, unspecified trimester: Secondary | ICD-10-CM

## 2020-10-09 DIAGNOSIS — Z6791 Unspecified blood type, Rh negative: Secondary | ICD-10-CM

## 2020-10-09 NOTE — Progress Notes (Signed)
   PRENATAL VISIT NOTE  Subjective:  Kathy Aguilar is a 34 y.o. M3O1771 at [redacted]w[redacted]d being seen today for ongoing prenatal care.  She is currently monitored for the following issues for this high-risk pregnancy and has Rh negative state in antepartum period; Headache disorder; Supervision of other normal pregnancy, antepartum; and Multigravida of advanced maternal age in first trimester on their problem list.  Patient reports no complaints.  Contractions: Not present. Vag. Bleeding: None.  Movement: Present. Denies leaking of fluid.   The following portions of the patient's history were reviewed and updated as appropriate: allergies, current medications, past family history, past medical history, past social history, past surgical history and problem list.   Objective:   Vitals:   10/09/20 1634  BP: 113/76  Pulse: 84  Weight: 195 lb (88.5 kg)    Fetal Status: Fetal Heart Rate (bpm): 149   Movement: Present     General:  Alert, oriented and cooperative. Patient is in no acute distress.  Skin: Skin is warm and dry. No rash noted.   Cardiovascular: Normal heart rate noted  Respiratory: Normal respiratory effort, no problems with respiration noted  Abdomen: Soft, gravid, appropriate for gestational age.  Pain/Pressure: Absent     Pelvic: Cervical exam deferred        Extremities: Normal range of motion.  Edema: None  Mental Status: Normal mood and affect. Normal behavior. Normal judgment and thought content.   Assessment and Plan:  Pregnancy: H6F7903 at [redacted]w[redacted]d 1. Supervision of other normal pregnancy, antepartum No concerns Has anatomy scan scheduled, aware of visit AFP today  2. Multigravida of advanced maternal age in first trimester NIP low risk  3. Rh negative state in antepartum period Rhogam at 28 wk   Preterm labor symptoms and general obstetric precautions including but not limited to vaginal bleeding, contractions, leaking of fluid and fetal movement were reviewed in  detail with the patient. Please refer to After Visit Summary for other counseling recommendations.   Return in about 4 weeks (around 11/06/2020) for Routine prenatal care, in person, MD or APP.  Future Appointments  Date Time Provider Department Center  10/15/2020  2:00 PM [facility] NURSE [facility]  10/15/2020  2:15 PM [facility] US2 [facility]  11/13/2020  4:00 PM Northwood Bing, MD [facility]    Federico Flake, MD

## 2020-10-12 LAB — AFP, SERUM, OPEN SPINA BIFIDA
AFP MoM: 0.92
AFP Value: 34.5 ng/mL
Gest. Age on Collection Date: 18 weeks
Maternal Age At EDD: 36.3 yr
OSBR Risk 1 IN: 10000
Test Results:: NEGATIVE
Weight: 195 [lb_av]

## 2020-10-15 ENCOUNTER — Ambulatory Visit: Payer: BC Managed Care – PPO | Attending: Family Medicine

## 2020-10-15 ENCOUNTER — Other Ambulatory Visit: Payer: Self-pay

## 2020-10-15 ENCOUNTER — Encounter: Payer: Self-pay | Admitting: *Deleted

## 2020-10-15 ENCOUNTER — Ambulatory Visit: Payer: BC Managed Care – PPO | Admitting: *Deleted

## 2020-10-15 DIAGNOSIS — O0990 Supervision of high risk pregnancy, unspecified, unspecified trimester: Secondary | ICD-10-CM

## 2020-10-15 DIAGNOSIS — Z3480 Encounter for supervision of other normal pregnancy, unspecified trimester: Secondary | ICD-10-CM | POA: Diagnosis not present

## 2020-10-16 ENCOUNTER — Other Ambulatory Visit: Payer: Self-pay | Admitting: *Deleted

## 2020-10-16 DIAGNOSIS — O09522 Supervision of elderly multigravida, second trimester: Secondary | ICD-10-CM

## 2020-10-20 ENCOUNTER — Other Ambulatory Visit: Payer: Self-pay | Admitting: *Deleted

## 2020-10-20 DIAGNOSIS — O09522 Supervision of elderly multigravida, second trimester: Secondary | ICD-10-CM

## 2020-11-11 ENCOUNTER — Ambulatory Visit: Payer: BC Managed Care – PPO | Admitting: *Deleted

## 2020-11-11 ENCOUNTER — Other Ambulatory Visit: Payer: Self-pay

## 2020-11-11 ENCOUNTER — Ambulatory Visit: Payer: BC Managed Care – PPO | Attending: Obstetrics and Gynecology

## 2020-11-11 ENCOUNTER — Encounter: Payer: Self-pay | Admitting: *Deleted

## 2020-11-11 DIAGNOSIS — O09522 Supervision of elderly multigravida, second trimester: Secondary | ICD-10-CM

## 2020-11-11 DIAGNOSIS — O0990 Supervision of high risk pregnancy, unspecified, unspecified trimester: Secondary | ICD-10-CM | POA: Insufficient documentation

## 2020-11-11 DIAGNOSIS — Z3A22 22 weeks gestation of pregnancy: Secondary | ICD-10-CM

## 2020-11-11 DIAGNOSIS — Z368A Encounter for antenatal screening for other genetic defects: Secondary | ICD-10-CM

## 2020-11-11 DIAGNOSIS — O360120 Maternal care for anti-D [Rh] antibodies, second trimester, not applicable or unspecified: Secondary | ICD-10-CM

## 2020-11-13 ENCOUNTER — Other Ambulatory Visit: Payer: Self-pay

## 2020-11-13 ENCOUNTER — Ambulatory Visit (INDEPENDENT_AMBULATORY_CARE_PROVIDER_SITE_OTHER): Payer: BC Managed Care – PPO | Admitting: Obstetrics and Gynecology

## 2020-11-13 VITALS — BP 120/78 | HR 85 | Wt 200.6 lb

## 2020-11-13 DIAGNOSIS — Z3A23 23 weeks gestation of pregnancy: Secondary | ICD-10-CM

## 2020-11-13 DIAGNOSIS — O0990 Supervision of high risk pregnancy, unspecified, unspecified trimester: Secondary | ICD-10-CM

## 2020-11-13 DIAGNOSIS — O09521 Supervision of elderly multigravida, first trimester: Secondary | ICD-10-CM

## 2020-11-13 DIAGNOSIS — O26899 Other specified pregnancy related conditions, unspecified trimester: Secondary | ICD-10-CM

## 2020-11-13 DIAGNOSIS — Z6791 Unspecified blood type, Rh negative: Secondary | ICD-10-CM

## 2020-11-13 NOTE — Progress Notes (Signed)
Prenatal Visit Note Date: 11/13/2020 Clinic: [facility]  Subjective:  Kathy Aguilar is a 35 y.o. [id number] at [redacted]w[redacted]d being seen today for ongoing prenatal care.  She is currently monitored for the following issues for this low-risk pregnancy and has Rh negative state in antepartum period; Headache disorder; Supervision of high risk pregnancy, antepartum; and Multigravida of advanced maternal age in first trimester on their problem list.  Patient reports no complaints.   Contractions: Not present. Vag. Bleeding: None.  Movement: Present. Denies leaking of fluid.   The following portions of the patient's history were reviewed and updated as appropriate: allergies, current medications, past family history, past medical history, past social history, past surgical history and problem list. Problem list updated.  Objective:   Vitals:   11/13/20 1608  BP: 120/78  Pulse: 85  Weight: 200 lb 9.6 oz (91 kg)    Fetal Status: Fetal Heart Rate (bpm): 140 Fundal Height: 23 cm Movement: Present     General:  Alert, oriented and cooperative. Patient is in no acute distress.  Skin: Skin is warm and dry. No rash noted.   Cardiovascular: Normal heart rate noted  Respiratory: Normal respiratory effort, no problems with respiration noted  Abdomen: Soft, gravid, appropriate for gestational age. Pain/Pressure: Absent     Pelvic:  Cervical exam deferred        Extremities: Normal range of motion.     Mental Status: Normal mood and affect. Normal behavior. Normal judgment and thought content.   Urinalysis:      Assessment and Plan:  Pregnancy: [id number] at [redacted]w[redacted]d  1. Rh negative state in antepartum period Rhogam and antibody screen at 28wks  2. Supervision of high risk pregnancy, antepartum Routine care. Normal follow up u/s on 2/1, rpt PRN.  3. Multigravida of advanced maternal age in first trimester No issues  4. [redacted] weeks gestation of pregnancy  Preterm labor  symptoms and general obstetric precautions including but not limited to vaginal bleeding, contractions, leaking of fluid and fetal movement were reviewed in detail with the patient. Please refer to After Visit Summary for other counseling recommendations.  Return in about 3 weeks (around 12/04/2020) for in person or virtual, md or app.   Florence Bing, MD

## 2020-12-02 ENCOUNTER — Ambulatory Visit (INDEPENDENT_AMBULATORY_CARE_PROVIDER_SITE_OTHER): Payer: BC Managed Care – PPO | Admitting: Obstetrics and Gynecology

## 2020-12-02 ENCOUNTER — Other Ambulatory Visit: Payer: Self-pay

## 2020-12-02 VITALS — BP 137/87 | HR 97 | Wt 199.0 lb

## 2020-12-02 DIAGNOSIS — Z6791 Unspecified blood type, Rh negative: Secondary | ICD-10-CM

## 2020-12-02 DIAGNOSIS — O09522 Supervision of elderly multigravida, second trimester: Secondary | ICD-10-CM

## 2020-12-02 DIAGNOSIS — Z3A25 25 weeks gestation of pregnancy: Secondary | ICD-10-CM

## 2020-12-02 DIAGNOSIS — O26899 Other specified pregnancy related conditions, unspecified trimester: Secondary | ICD-10-CM

## 2020-12-02 DIAGNOSIS — O0990 Supervision of high risk pregnancy, unspecified, unspecified trimester: Secondary | ICD-10-CM

## 2020-12-02 NOTE — Progress Notes (Signed)
Prenatal Visit Note Date: 12/02/2020 Clinic: [facility]  Subjective:  Kathy Aguilar is a 35 y.o. D5H2992 at [redacted]w[redacted]d being seen today for ongoing prenatal care.  She is currently monitored for the following issues for this low-risk pregnancy and has Rh negative state in antepartum period; Headache disorder; Supervision of high risk pregnancy, antepartum; and AMA (advanced maternal age) multigravida 35+, second trimester on their problem list.  Patient reports no complaints.   Contractions: Not present. Vag. Bleeding: None.  Movement: Present. Denies leaking of fluid.   The following portions of the patient's history were reviewed and updated as appropriate: allergies, current medications, past family history, past medical history, past social history, past surgical history and problem list. Problem list updated.  Objective:   Vitals:   12/02/20 1517  BP: 137/87  Pulse: 97  Weight: 199 lb (90.3 kg)    Fetal Status: Fetal Heart Rate (bpm): 145 Fundal Height: 26 cm Movement: Present     General:  Alert, oriented and cooperative. Patient is in no acute distress.  Skin: Skin is warm and dry. No rash noted.   Cardiovascular: Normal heart rate noted  Respiratory: Normal respiratory effort, no problems with respiration noted  Abdomen: Soft, gravid, appropriate for gestational age. Pain/Pressure: Present     Pelvic:  Cervical exam deferred        Extremities: Normal range of motion.  Edema: Trace  Mental Status: Normal mood and affect. Normal behavior. Normal judgment and thought content.   Urinalysis:      Assessment and Plan:  Pregnancy: [id number] at [redacted]w[redacted]d  1. Supervision of high risk pregnancy, antepartum Routine care. 28wk labs, rhogam nv - CBC; Future - HIV antibody (with reflex); Future - RPR; Future - Antibody screen; Future  2. Rh negative state in antepartum period  3. AMA (advanced maternal age) multigravida 35+, second trimester  4. [redacted] weeks  gestation of pregnancy  Preterm labor symptoms and general obstetric precautions including but not limited to vaginal bleeding, contractions, leaking of fluid and fetal movement were reviewed in detail with the patient. Please refer to After Visit Summary for other counseling recommendations.  Return in about 2 weeks (around 12/16/2020) for in person, 2hr GTT, md or app.    Bing, MD

## 2020-12-17 ENCOUNTER — Other Ambulatory Visit: Payer: Self-pay

## 2020-12-17 ENCOUNTER — Ambulatory Visit (INDEPENDENT_AMBULATORY_CARE_PROVIDER_SITE_OTHER): Payer: BC Managed Care – PPO | Admitting: Family Medicine

## 2020-12-17 ENCOUNTER — Encounter: Payer: Self-pay | Admitting: Family Medicine

## 2020-12-17 VITALS — BP 127/84 | HR 102 | Wt 197.0 lb

## 2020-12-17 DIAGNOSIS — O0990 Supervision of high risk pregnancy, unspecified, unspecified trimester: Secondary | ICD-10-CM | POA: Diagnosis not present

## 2020-12-17 DIAGNOSIS — O0992 Supervision of high risk pregnancy, unspecified, second trimester: Secondary | ICD-10-CM

## 2020-12-17 DIAGNOSIS — Z6791 Unspecified blood type, Rh negative: Secondary | ICD-10-CM | POA: Diagnosis not present

## 2020-12-17 DIAGNOSIS — O09522 Supervision of elderly multigravida, second trimester: Secondary | ICD-10-CM | POA: Diagnosis not present

## 2020-12-17 DIAGNOSIS — O99019 Anemia complicating pregnancy, unspecified trimester: Secondary | ICD-10-CM | POA: Diagnosis not present

## 2020-12-17 DIAGNOSIS — O26899 Other specified pregnancy related conditions, unspecified trimester: Secondary | ICD-10-CM

## 2020-12-17 DIAGNOSIS — O26892 Other specified pregnancy related conditions, second trimester: Secondary | ICD-10-CM | POA: Diagnosis not present

## 2020-12-17 MED ORDER — RHO D IMMUNE GLOBULIN 1500 UNIT/2ML IJ SOSY
300.0000 ug | PREFILLED_SYRINGE | Freq: Once | INTRAMUSCULAR | Status: AC
  Administered 2020-12-17: 300 ug via INTRAMUSCULAR

## 2020-12-17 NOTE — Progress Notes (Signed)
tdap next visit

## 2020-12-17 NOTE — Progress Notes (Signed)
   PRENATAL VISIT NOTE  Subjective:  Kathy Aguilar is a 35 y.o. [id number] at [redacted]w[redacted]d being seen today for ongoing prenatal care.  She is currently monitored for the following issues for this high-risk pregnancy and has Rh negative state in antepartum period; Headache disorder; Supervision of high risk pregnancy, antepartum; and AMA (advanced maternal age) multigravida 35+, second trimester on their problem list.  Patient reports no complaints.  Contractions: Not present. Vag. Bleeding: None.  Movement: Present. Denies leaking of fluid.   The following portions of the patient's history were reviewed and updated as appropriate: allergies, current medications, past family history, past medical history, past social history, past surgical history and problem list.   Objective:   Vitals:   12/17/20 0900  BP: 127/84  Pulse: (!) 102  Weight: 197 lb (89.4 kg)    Fetal Status: Fetal Heart Rate (bpm): 146 Fundal Height: 28 cm Movement: Present     General:  Alert, oriented and cooperative. Patient is in no acute distress.  Skin: Skin is warm and dry. No rash noted.   Cardiovascular: Normal heart rate noted  Respiratory: Normal respiratory effort, no problems with respiration noted  Abdomen: Soft, gravid, appropriate for gestational age.  Pain/Pressure: Absent     Pelvic: Cervical exam deferred        Extremities: Normal range of motion.  Edema: Trace  Mental Status: Normal mood and affect. Normal behavior. Normal judgment and thought content.   Assessment and Plan:  Pregnancy: Q3R0076 at [redacted]w[redacted]d 1. Supervision of high risk pregnancy, antepartum - will get Tdap @next  visit - Glucose Tolerance, 2 Hours w/1 Hour - CBC - RPR - HIV Antibody (routine testing w rflx) - Antibody screen  2. AMA (advanced maternal age) multigravida 35+, second trimester NIPT LR and AFp neg  3. Rh negative state in antepartum period Rhogam today  Preterm labor symptoms and general obstetric precautions including  but not limited to vaginal bleeding, contractions, leaking of fluid and fetal movement were reviewed in detail with the patient. Please refer to After Visit Summary for other counseling recommendations.   Return in about 2 weeks (around 12/31/2020) for Routine prenatal care, MD or APP, OK for Telehealth/Virtual health OB Visit.  No future appointments.  01/02/2021, MD

## 2020-12-18 ENCOUNTER — Encounter: Payer: Self-pay | Admitting: Family Medicine

## 2020-12-18 DIAGNOSIS — O99019 Anemia complicating pregnancy, unspecified trimester: Secondary | ICD-10-CM | POA: Insufficient documentation

## 2020-12-18 LAB — GLUCOSE TOLERANCE, 2 HOURS W/ 1HR
Glucose, 1 hour: 128 mg/dL (ref 65–179)
Glucose, 2 hour: 117 mg/dL (ref 65–152)
Glucose, Fasting: 83 mg/dL (ref 65–91)

## 2020-12-18 LAB — CBC
Hematocrit: 31.7 % — ABNORMAL LOW (ref 34.0–46.6)
Hemoglobin: 10.7 g/dL — ABNORMAL LOW (ref 11.1–15.9)
MCH: 29.8 pg (ref 26.6–33.0)
MCHC: 33.8 g/dL (ref 31.5–35.7)
MCV: 88 fL (ref 79–97)
Platelets: 256 10*3/uL (ref 150–450)
RBC: 3.59 x10E6/uL — ABNORMAL LOW (ref 3.77–5.28)
RDW: 13.9 % (ref 11.7–15.4)
WBC: 9.7 10*3/uL (ref 3.4–10.8)

## 2020-12-18 LAB — RPR: RPR Ser Ql: NONREACTIVE

## 2020-12-18 LAB — ANTIBODY SCREEN: Antibody Screen: NEGATIVE

## 2020-12-18 LAB — HIV ANTIBODY (ROUTINE TESTING W REFLEX): HIV Screen 4th Generation wRfx: NONREACTIVE

## 2020-12-19 ENCOUNTER — Encounter: Payer: Self-pay | Admitting: Family Medicine

## 2020-12-19 DIAGNOSIS — Z9079 Acquired absence of other genital organ(s): Secondary | ICD-10-CM | POA: Insufficient documentation

## 2020-12-19 LAB — IRON AND TIBC
Iron Saturation: 11 % — ABNORMAL LOW (ref 15–55)
Iron: 50 ug/dL (ref 27–159)
Total Iron Binding Capacity: 450 ug/dL (ref 250–450)
UIBC: 400 ug/dL (ref 131–425)

## 2020-12-19 LAB — SPECIMEN STATUS REPORT

## 2020-12-31 ENCOUNTER — Other Ambulatory Visit: Payer: Self-pay

## 2020-12-31 ENCOUNTER — Other Ambulatory Visit (HOSPITAL_COMMUNITY)
Admission: RE | Admit: 2020-12-31 | Discharge: 2020-12-31 | Disposition: A | Discharge status: Home / Self Care | Payer: BC Managed Care – PPO | Source: Ambulatory Visit | Attending: Family Medicine | Admitting: Family Medicine

## 2020-12-31 ENCOUNTER — Ambulatory Visit (INDEPENDENT_AMBULATORY_CARE_PROVIDER_SITE_OTHER): Payer: BC Managed Care – PPO | Admitting: Family Medicine

## 2020-12-31 VITALS — BP 130/78 | HR 106 | Wt 202.0 lb

## 2020-12-31 DIAGNOSIS — B373 Candidiasis of vulva and vagina: Secondary | ICD-10-CM | POA: Diagnosis not present

## 2020-12-31 DIAGNOSIS — O09522 Supervision of elderly multigravida, second trimester: Secondary | ICD-10-CM

## 2020-12-31 DIAGNOSIS — N898 Other specified noninflammatory disorders of vagina: Secondary | ICD-10-CM | POA: Insufficient documentation

## 2020-12-31 DIAGNOSIS — Z3A30 30 weeks gestation of pregnancy: Secondary | ICD-10-CM | POA: Diagnosis not present

## 2020-12-31 DIAGNOSIS — Z9079 Acquired absence of other genital organ(s): Secondary | ICD-10-CM

## 2020-12-31 DIAGNOSIS — O23593 Infection of other part of genital tract in pregnancy, third trimester: Secondary | ICD-10-CM | POA: Diagnosis not present

## 2020-12-31 DIAGNOSIS — O0990 Supervision of high risk pregnancy, unspecified, unspecified trimester: Secondary | ICD-10-CM

## 2020-12-31 MED ORDER — FLUCONAZOLE 150 MG PO TABS: 150.0000 mg | ORAL_TABLET | Freq: Once | ORAL | 1 refills | Status: AC

## 2020-12-31 NOTE — Progress Notes (Signed)
   PRENATAL VISIT NOTE  Subjective:  Kathy Aguilar is a 35 y.o. O1H0865 at [redacted]w[redacted]d being seen today for ongoing prenatal care.  She is currently monitored for the following issues for this low-risk pregnancy and has Rh negative state in antepartum period; Headache disorder; Supervision of high risk pregnancy, antepartum; AMA (advanced maternal age) multigravida 35+, second trimester; Anemia affecting pregnancy, antepartum; and H/O unilateral salpingectomy- Right removed on their problem list.  Patient reports vaginal itching for 3 days. + discharge.  Contractions: Regular.- BH. Vag. Bleeding: None.  Movement: Absent. Denies leaking of fluid.   The following portions of the patient's history were reviewed and updated as appropriate: allergies, current medications, past family history, past medical history, past social history, past surgical history and problem list.   Objective:   Vitals:   12/31/20 1451  BP: 130/78  Pulse: (!) 106  Weight: 202 lb (91.6 kg)    Fetal Status: Fetal Heart Rate (bpm): 142 Fundal Height: 30 cm Movement: Absent     General:  Alert, oriented and cooperative. Patient is in no acute distress.  Skin: Skin is warm and dry. No rash noted.   Cardiovascular: Normal heart rate noted  Respiratory: Normal respiratory effort, no problems with respiration noted  Abdomen: Soft, gravid, appropriate for gestational age.  Pain/Pressure: Absent     Pelvic: Cervical exam deferred        Extremities: Normal range of motion.  Edema: Mild pitting, slight indentation  Mental Status: Normal mood and affect. Normal behavior. Normal judgment and thought content.   Assessment and Plan:  Pregnancy: [id number] at [redacted]w[redacted]d  1. Vaginal discharge - Consistent with yeast, will treat empirically with diflucan  - Cervicovaginal ancillary only  2. Supervision of high risk pregnancy, antepartum Up to date Has appts scheduled  3. AMA (advanced maternal age) multigravida 35+, second  trimester NIP low risk  4. H/O unilateral salpingectomy- Right removed Desires left salpingectomy    Preterm labor symptoms and general obstetric precautions including but not limited to vaginal bleeding, contractions, leaking of fluid and fetal movement were reviewed in detail with the patient. Please refer to After Visit Summary for other counseling recommendations.   Return in about 2 weeks (around 01/14/2021) for Routine prenatal care, scheduled visit.  Future Appointments  Date Time Provider Department Center  01/14/2021  4:00 PM Federico Flake, MD [facility]  01/28/2021  4:00 PM Federico Flake, MD [facility]  02/11/2021  4:00 PM Federico Flake, MD [facility]  02/18/2021  4:10 PM Calvert Cantor, CNM [facility]  02/25/2021  4:00 PM Federico Flake, MD [facility]  03/04/2021  4:10 PM Calvert Cantor, CNM [facility]    Federico Flake, MD

## 2021-01-02 LAB — CERVICOVAGINAL ANCILLARY ONLY
Bacterial Vaginitis (gardnerella): NEGATIVE
Candida Glabrata: NEGATIVE
Candida Vaginitis: POSITIVE — AB
Comment: NEGATIVE
Comment: NEGATIVE
Comment: NEGATIVE

## 2021-01-14 ENCOUNTER — Other Ambulatory Visit: Payer: Self-pay

## 2021-01-14 ENCOUNTER — Encounter: Payer: Self-pay | Admitting: Family Medicine

## 2021-01-14 ENCOUNTER — Ambulatory Visit (INDEPENDENT_AMBULATORY_CARE_PROVIDER_SITE_OTHER): Payer: BC Managed Care – PPO | Admitting: Family Medicine

## 2021-01-14 VITALS — BP 132/82 | HR 93 | Wt 204.0 lb

## 2021-01-14 DIAGNOSIS — Z6791 Unspecified blood type, Rh negative: Secondary | ICD-10-CM

## 2021-01-14 DIAGNOSIS — O0990 Supervision of high risk pregnancy, unspecified, unspecified trimester: Secondary | ICD-10-CM

## 2021-01-14 DIAGNOSIS — O99019 Anemia complicating pregnancy, unspecified trimester: Secondary | ICD-10-CM

## 2021-01-14 DIAGNOSIS — Z9079 Acquired absence of other genital organ(s): Secondary | ICD-10-CM

## 2021-01-14 DIAGNOSIS — O09522 Supervision of elderly multigravida, second trimester: Secondary | ICD-10-CM

## 2021-01-14 DIAGNOSIS — O26899 Other specified pregnancy related conditions, unspecified trimester: Secondary | ICD-10-CM

## 2021-01-14 NOTE — Progress Notes (Signed)
   PRENATAL VISIT NOTE  Subjective:  Kathy Aguilar is a 35 y.o. [id number] at [redacted]w[redacted]d being seen today for ongoing prenatal care.  She is currently monitored for the following issues for this high-risk pregnancy and has Rh negative state in antepartum period; Headache disorder; Supervision of high risk pregnancy, antepartum; AMA (advanced maternal age) multigravida 35+, second trimester; Anemia affecting pregnancy, antepartum; and H/O unilateral salpingectomy- Right removed on their problem list.  Patient reports no complaints.  Contractions: Not present. Vag. Bleeding: None.  Movement: Present. Denies leaking of fluid.   The following portions of the patient's history were reviewed and updated as appropriate: allergies, current medications, past family history, past medical history, past social history, past surgical history and problem list.   Objective:   Vitals:   01/14/21 1605  BP: 132/82  Pulse: 93  Weight: 204 lb (92.5 kg)    Fetal Status: Fetal Heart Rate (bpm): 130   Movement: Present     General:  Alert, oriented and cooperative. Patient is in no acute distress.  Skin: Skin is warm and dry. No rash noted.   Cardiovascular: Normal heart rate noted  Respiratory: Normal respiratory effort, no problems with respiration noted  Abdomen: Soft, gravid, appropriate for gestational age.  Pain/Pressure: Absent     Pelvic: Cervical exam deferred        Extremities: Normal range of motion.  Edema: Moderate pitting, indentation subsides rapidly  Mental Status: Normal mood and affect. Normal behavior. Normal judgment and thought content.   Assessment and Plan:  Pregnancy: E7O3500 at [redacted]w[redacted]d 1. Supervision of high risk pregnancy, antepartum Up to date FH appropriate  2. H/O unilateral salpingectomy- Right removed  3. Anemia affecting pregnancy, antepartum Recheck hgb next visit  4. AMA (advanced maternal age) multigravida 35+, second trimester  5. Rh negative state in antepartum  period Received rhogam 12/17/2020  Preterm labor symptoms and general obstetric precautions including but not limited to vaginal bleeding, contractions, leaking of fluid and fetal movement were reviewed in detail with the patient. Please refer to After Visit Summary for other counseling recommendations.   Return in about 2 weeks (around 01/28/2021) for Routine prenatal care, MD or APP, Scheduled prenatal/NST.  Future Appointments  Date Time Provider Department Center  01/28/2021  4:00 PM Federico Flake, MD [facility]  02/11/2021  4:00 PM Federico Flake, MD [facility]  02/18/2021  4:10 PM Calvert Cantor, CNM [facility]  02/25/2021  4:00 PM Federico Flake, MD [facility]  03/04/2021  4:10 PM Calvert Cantor, CNM [facility]    Federico Flake, MD

## 2021-01-28 ENCOUNTER — Ambulatory Visit (INDEPENDENT_AMBULATORY_CARE_PROVIDER_SITE_OTHER): Payer: BC Managed Care – PPO | Admitting: Family Medicine

## 2021-01-28 ENCOUNTER — Other Ambulatory Visit: Payer: Self-pay

## 2021-01-28 VITALS — BP 138/84 | HR 97 | Wt 206.0 lb

## 2021-01-28 DIAGNOSIS — O0990 Supervision of high risk pregnancy, unspecified, unspecified trimester: Secondary | ICD-10-CM | POA: Diagnosis not present

## 2021-01-28 DIAGNOSIS — Z23 Encounter for immunization: Secondary | ICD-10-CM | POA: Diagnosis not present

## 2021-01-28 DIAGNOSIS — O99019 Anemia complicating pregnancy, unspecified trimester: Secondary | ICD-10-CM

## 2021-01-28 LAB — CBC
Hematocrit: 30.9 % — ABNORMAL LOW (ref 34.0–46.6)
Hemoglobin: 10.4 g/dL — ABNORMAL LOW (ref 11.1–15.9)
MCH: 28.7 pg (ref 26.6–33.0)
MCHC: 33.7 g/dL (ref 31.5–35.7)
MCV: 85 fL (ref 79–97)
Platelets: 296 10*3/uL (ref 150–450)
RBC: 3.63 x10E6/uL — ABNORMAL LOW (ref 3.77–5.28)
RDW: 13.7 % (ref 11.7–15.4)
WBC: 10.6 10*3/uL (ref 3.4–10.8)

## 2021-01-28 NOTE — Progress Notes (Signed)
   PRENATAL VISIT NOTE  Subjective:  Kathy Aguilar is a 35 y.o. O8C1660 at [redacted]w[redacted]d being seen today for ongoing prenatal care.  She is currently monitored for the following issues for this low-risk pregnancy and has Rh negative state in antepartum period; Headache disorder; Supervision of high risk pregnancy, antepartum; AMA (advanced maternal age) multigravida 35+, second trimester; Anemia affecting pregnancy, antepartum; and H/O unilateral salpingectomy- Right removed on their problem list.  Patient reports no complaints.  Contractions: Irritability. Vag. Bleeding: None.  Movement: Present. Denies leaking of fluid.   The following portions of the patient's history were reviewed and updated as appropriate: allergies, current medications, past family history, past medical history, past social history, past surgical history and problem list.   Objective:   Vitals:   01/28/21 1604  BP: 138/84  Pulse: 97  Weight: 206 lb (93.4 kg)    Fetal Status: Fetal Heart Rate (bpm): 134   Movement: Present     General:  Alert, oriented and cooperative. Patient is in no acute distress.  Skin: Skin is warm and dry. No rash noted.   Cardiovascular: Normal heart rate noted  Respiratory: Normal respiratory effort, no problems with respiration noted  Abdomen: Soft, gravid, appropriate for gestational age.  Pain/Pressure: Present     Pelvic: Cervical exam deferred        Extremities: Normal range of motion.  Edema: Moderate pitting, indentation subsides rapidly  Mental Status: Normal mood and affect. Normal behavior. Normal judgment and thought content.   Assessment and Plan:  Pregnancy: [id number] at [redacted]w[redacted]d 1. Supervision of high risk pregnancy, antepartum Up to date FH appropriate Tdap today  2. Anemia affecting pregnancy, antepartum CBC today   Preterm labor symptoms and general obstetric precautions including but not limited to vaginal bleeding, contractions, leaking of fluid and fetal movement were  reviewed in detail with the patient. Please refer to After Visit Summary for other counseling recommendations.   No follow-ups on file.  Future Appointments  Date Time Provider Department Center  02/11/2021  4:00 PM Federico Flake, MD [facility]  02/18/2021  4:10 PM Calvert Cantor, CNM [facility]  02/25/2021  4:00 PM Federico Flake, MD [facility]  03/04/2021  4:10 PM Calvert Cantor, CNM [facility]    Federico Flake, MD

## 2021-01-29 MED ORDER — FERROUS SULFATE 325 (65 FE) MG PO TABS: 325.0000 mg | ORAL_TABLET | ORAL | 1 refills | Status: DC

## 2021-01-29 NOTE — Addendum Note (Signed)
Addended by: Geanie Berlin on: 01/29/2021 09:26 AM   Modules accepted: Orders

## 2021-02-11 ENCOUNTER — Ambulatory Visit (INDEPENDENT_AMBULATORY_CARE_PROVIDER_SITE_OTHER): Payer: BC Managed Care – PPO | Admitting: Family Medicine

## 2021-02-11 ENCOUNTER — Other Ambulatory Visit: Payer: Self-pay

## 2021-02-11 ENCOUNTER — Other Ambulatory Visit (HOSPITAL_COMMUNITY)
Admission: RE | Admit: 2021-02-11 | Discharge: 2021-02-11 | Disposition: A | Discharge status: Home / Self Care | Payer: BC Managed Care – PPO | Source: Ambulatory Visit | Attending: Family Medicine | Admitting: Family Medicine

## 2021-02-11 VITALS — BP 130/83 | HR 92 | Wt 206.0 lb

## 2021-02-11 DIAGNOSIS — O0990 Supervision of high risk pregnancy, unspecified, unspecified trimester: Secondary | ICD-10-CM | POA: Diagnosis not present

## 2021-02-11 DIAGNOSIS — O1203 Gestational edema, third trimester: Secondary | ICD-10-CM

## 2021-02-11 DIAGNOSIS — Z3A36 36 weeks gestation of pregnancy: Secondary | ICD-10-CM

## 2021-02-11 NOTE — Progress Notes (Signed)
   PRENATAL VISIT NOTE  Subjective:  Kathy Aguilar is a 35 y.o. [id number] at [redacted]w[redacted]d being seen today for ongoing prenatal care.  She is currently monitored for the following issues for this high-risk pregnancy and has Rh negative state in antepartum period; Headache disorder; Supervision of high risk pregnancy, antepartum; AMA (advanced maternal age) multigravida 35+, second trimester; Anemia affecting pregnancy, antepartum; and H/O unilateral salpingectomy- Right removed on their problem list.  Patient reports no complaints.  Contractions: Not present. Vag. Bleeding: None.  Movement: Present. Denies leaking of fluid.   The following portions of the patient's history were reviewed and updated as appropriate: allergies, current medications, past family history, past medical history, past social history, past surgical history and problem list.   Objective:   Vitals:   02/11/21 1612  BP: 130/83  Pulse: 92  Weight: 206 lb (93.4 kg)    Fetal Status: Fetal Heart Rate (bpm): 136 Fundal Height: 36 cm Movement: Present      General:  Alert, oriented and cooperative. Patient is in no acute distress.  Skin: Skin is warm and dry. No rash noted.   Cardiovascular: Normal heart rate noted  Respiratory: Normal respiratory effort, no problems with respiration noted  Abdomen: Soft, gravid, appropriate for gestational age.  Pain/Pressure: Present     Pelvic: Cervical exam deferred        Extremities: Normal range of motion.  Edema: Moderate pitting, indentation subsides rapidly  Mental Status: Normal mood and affect. Normal behavior. Normal judgment and thought content.   Assessment and Plan:  Pregnancy: [id number] at [redacted]w[redacted]d 1. Supervision of high risk pregnancy, antepartum Up to date - Strep Gp B NAA - GC/Chlamydia probe amp ([employer])not at [facility] - Protein / creatinine ratio, urine  2. Edema during pregnancy in third trimester 2+ pitting edema bilaterally. Will check UPC and urine dip to assure  not spilling large amt of protein. BP is wnl.  - Protein / creatinine ratio, urine  Preterm labor symptoms and general obstetric precautions including but not limited to vaginal bleeding, contractions, leaking of fluid and fetal movement were reviewed in detail with the patient. Please refer to After Visit Summary for other counseling recommendations.   Return in about 1 week (around 02/18/2021) for Routine prenatal care, Scheduled prenatal.  Future Appointments  Date Time Provider Department Center  02/18/2021  4:10 PM Calvert Cantor, [place] [facility]  02/25/2021  4:00 PM Federico Flake, MD [facility]  03/04/2021  4:10 PM Calvert Cantor, CNM [facility]    Federico Flake, MD

## 2021-02-12 ENCOUNTER — Encounter: Payer: Self-pay | Admitting: *Deleted

## 2021-02-12 LAB — PROTEIN / CREATININE RATIO, URINE
Creatinine, Urine: 129.3 mg/dL
Protein, Ur: 20.3 mg/dL
Protein/Creat Ratio: 157 mg/g creat (ref 0–200)

## 2021-02-13 LAB — GC/CHLAMYDIA PROBE AMP (~~LOC~~) NOT AT ARMC
Chlamydia: NEGATIVE
Comment: NEGATIVE
Comment: NORMAL
Neisseria Gonorrhea: NEGATIVE

## 2021-02-13 LAB — STREP GP B NAA: Strep Gp B NAA: NEGATIVE

## 2021-02-18 ENCOUNTER — Ambulatory Visit (INDEPENDENT_AMBULATORY_CARE_PROVIDER_SITE_OTHER): Payer: BC Managed Care – PPO | Admitting: Advanced Practice Midwife

## 2021-02-18 ENCOUNTER — Other Ambulatory Visit: Payer: Self-pay

## 2021-02-18 VITALS — BP 151/89 | HR 111 | Wt 205.0 lb

## 2021-02-18 DIAGNOSIS — R030 Elevated blood-pressure reading, without diagnosis of hypertension: Secondary | ICD-10-CM

## 2021-02-18 DIAGNOSIS — Z3A37 37 weeks gestation of pregnancy: Secondary | ICD-10-CM

## 2021-02-18 DIAGNOSIS — O0990 Supervision of high risk pregnancy, unspecified, unspecified trimester: Secondary | ICD-10-CM

## 2021-02-18 NOTE — Patient Instructions (Signed)
Hypertension During Pregnancy Hypertension is also called high blood pressure. High blood pressure means that the force of the blood moving in your body is high enough to cause problems for you and your baby. Different types of high blood pressure can happen during pregnancy. The types are:  High blood pressure before you got pregnant. This is called chronic hypertension.  This can continue during your pregnancy. Your doctor will want to keep checking your blood pressure. You may need medicine to control your blood pressure while you are pregnant. You will need follow-up visits after you have your baby.  High blood pressure that goes up during pregnancy when it was normal before. This is called gestational hypertension. It will often get better after you have your baby, but your doctor will need to watch your blood pressure to make sure that it is getting better.  You may develop high blood pressure after giving birth. This is called postpartum hypertension. This often occurs within 48 hours after childbirth but may occur up to 6 weeks after giving birth. Very high blood pressure during pregnancy is an emergency that needs treatment right away. How does this affect me? If you have high blood pressure during pregnancy, you have a higher chance of developing high blood pressure:  As you get older.  If you get pregnant again. In some cases, high blood pressure during pregnancy can cause:  Stroke.  Heart attack.  Damage to the kidneys, lungs, or liver.  Preeclampsia.  HELLP syndrome.  Seizures.  Problems with the placenta. How does this affect my baby? Your baby may:  Be born early.  Not weigh as much as he or she should.  Not handle labor well, leading to a C-section. This condition may also result in a baby's death before birth (stillbirth). What are the risks?  Having high blood pressure during a past pregnancy.  Being overweight.  Being age 35 or older.  Being pregnant  for the first time.  Being pregnant with more than one baby.  Becoming pregnant using fertility methods, such as IVF.  Having other problems, such as diabetes or kidney disease. What can I do to lower my risk?  Keep a healthy weight.  Eat a healthy diet.  Follow what your doctor tells you about treating any medical problems that you had before you got pregnant. It is very important to go to all of your doctor visits. Your doctor will check your blood pressure and make sure that your pregnancy is progressing as it should. Treatment should start early if a problem is found.   How is this treated? Treatment for high blood pressure during pregnancy can vary. It depends on the type of high blood pressure you have and how serious it is.  If you were taking medicine for your blood pressure before you got pregnant, talk with your doctor. You may need to change the medicine during pregnancy if it is not safe for your baby.  If your blood pressure goes up during pregnancy, your doctor may order medicine to treat this.  If you are at risk for preeclampsia, your doctor may tell you to take a low-dose aspirin while you are pregnant.  If you have very high blood pressure, you may need to stay in the hospital so you and your baby can be watched closely. You may also need to take medicine to lower your blood pressure.  In some cases, if your condition gets worse, you may need to have your baby early.   Follow these instructions at home: Eating and drinking  Drink enough fluid to keep your pee (urine) pale yellow.  Avoid caffeine.   Lifestyle  Do not smoke or use any products that contain nicotine or tobacco. If you need help quitting, ask your doctor.  Do not use alcohol or drugs.  Avoid stress.  Rest and get plenty of sleep.  Regular exercise can help. Ask your doctor what kinds of exercise are best for you. General instructions  Take over-the-counter and prescription medicines only as  told by your doctor.  Keep all prenatal and follow-up visits. Contact a doctor if:  You have symptoms that your doctor told you to watch for, such as: ? Headaches. ? A feeling like you may vomit (nausea). ? Vomiting. ? Belly (abdominal) pain. ? Feeling dizzy or light-headed. Get help right away if:  You have symptoms of serious problems, such as: ? Very bad belly pain that does not get better with treatment. ? A very bad headache that does not get better. ? Blurry vision. ? Double vision. ? Vomiting that does not get better. ? Sudden, fast weight gain. ? Sudden swelling in your hands, ankles, or face. ? Bleeding from your vagina. ? Blood in your pee. ? Shortness of breath. ? Chest pain. ? Weakness on one side of your body. ? Trouble talking.  Your baby is not moving as much as usual. These symptoms may be an emergency. Get help right away. Call your local emergency services (911 in the [place]  Do not wait to see if the symptoms will go away.  Do not drive yourself to the hospital. Summary  High blood pressure is also called hypertension.  High blood pressure means that the force of the blood moving in your body is high enough to cause problems for you and your baby.  Get help right away if you have symptoms of serious problems due to high blood pressure.  Keep all prenatal and follow-up visits. This information is not intended to replace advice given to you by your health care provider. Make sure you discuss any questions you have with your health care provider. Document Revised: 06/19/2020 Document Reviewed: 06/19/2020 Elsevier Patient Education  2021 Elsevier Inc.  

## 2021-02-18 NOTE — Progress Notes (Signed)
   PRENATAL VISIT NOTE  Subjective:  Kathy Aguilar is a 35 y.o. [id number] at [redacted]w[redacted]d being seen today for ongoing prenatal care.  She is currently monitored for the following issues for this high-risk pregnancy and has Rh negative state in antepartum period; Headache disorder; Supervision of high risk pregnancy, antepartum; AMA (advanced maternal age) multigravida 35+, second trimester; Anemia affecting pregnancy, antepartum; and H/O unilateral salpingectomy- Right removed on their problem list.  Patient reports no complaints.  Contractions: Not present. Vag. Bleeding: None.  Movement: Present. Denies leaking of fluid. Denies headache, visual disturbances, RUQ/epigastric pain, new onset swelling or weight gain. Reports 3 days of "eaking up with headache". Pain resolved with Tylenol.  The following portions of the patient's history were reviewed and updated as appropriate: allergies, current medications, past family history, past medical history, past social history, past surgical history and problem list. Problem list updated.  Objective:   Vitals:   02/18/21 1605  BP: (!) 151/89  Pulse: (!) 111  Weight: 205 lb (93 kg)    Fetal Status: Fetal Heart Rate (bpm): 145 Fundal Height: 37 cm Movement: Present  Presentation: Vertex  General:  Alert, oriented and cooperative. Patient is in no acute distress.  Skin: Skin is warm and dry. No rash noted.   Cardiovascular: Normal heart rate noted  Respiratory: Normal respiratory effort, no problems with respiration noted  Abdomen: Soft, gravid, appropriate for gestational age.  Pain/Pressure: Present     Pelvic: Cervical exam deferred        Extremities: Normal range of motion.  Edema: Moderate pitting, indentation subsides rapidly  Mental Status: Normal mood and affect. Normal behavior. Normal judgment and thought content.   Assessment and Plan:  Pregnancy: [id number] at [redacted]w[redacted]d  1. Supervision of high risk pregnancy, antepartum - Routine care   2.  [redacted] weeks gestation of pregnancy   3. Elevated blood pressure reading - 151/89 in setting of patient running several errands immediately before appointment - Asymptomatic - No history of HTN per prenatal records - PEC labs normal 05/04 - Will ask patient to return for BP check first thing Thursday to assess for GHTN, may require IOL this week if repeat elevated BP  Term labor symptoms and general obstetric precautions including but not limited to vaginal bleeding, contractions, leaking of fluid and fetal movement were reviewed in detail with the patient. Please refer to After Visit Summary for other counseling recommendations.  Return in about 1 day (around 02/19/2021) for Return first thing tomorrow for BP check.  Future Appointments  Date Time Provider Department Center  02/19/2021  8:00 AM [facility] NURSE [facility]  02/25/2021  4:00 PM Federico Flake, MD [facility]  03/04/2021  4:10 PM Calvert Cantor, CNM [facility]    Calvert Cantor, CNM

## 2021-02-19 ENCOUNTER — Encounter (HOSPITAL_COMMUNITY)
Admission: AD | Disposition: A | Discharge status: Home / Self Care | Payer: Self-pay | Source: Home / Self Care | Attending: Obstetrics and Gynecology

## 2021-02-19 ENCOUNTER — Encounter (HOSPITAL_COMMUNITY): Payer: Self-pay | Admitting: Obstetrics & Gynecology

## 2021-02-19 ENCOUNTER — Ambulatory Visit (INDEPENDENT_AMBULATORY_CARE_PROVIDER_SITE_OTHER): Payer: BC Managed Care – PPO | Admitting: *Deleted

## 2021-02-19 ENCOUNTER — Inpatient Hospital Stay (HOSPITAL_BASED_OUTPATIENT_CLINIC_OR_DEPARTMENT_OTHER): Payer: BC Managed Care – PPO

## 2021-02-19 ENCOUNTER — Inpatient Hospital Stay (HOSPITAL_COMMUNITY): Payer: BC Managed Care – PPO | Admitting: Anesthesiology

## 2021-02-19 ENCOUNTER — Inpatient Hospital Stay (HOSPITAL_COMMUNITY)
Admission: AD | Admit: 2021-02-19 | Discharge: 2021-02-22 | DRG: 784 | Disposition: A | Discharge status: Home / Self Care | Payer: BC Managed Care – PPO | Attending: Obstetrics and Gynecology | Admitting: Obstetrics and Gynecology

## 2021-02-19 ENCOUNTER — Other Ambulatory Visit: Payer: Self-pay

## 2021-02-19 DIAGNOSIS — O321XX0 Maternal care for breech presentation, not applicable or unspecified: Secondary | ICD-10-CM | POA: Diagnosis present

## 2021-02-19 DIAGNOSIS — O9081 Anemia of the puerperium: Secondary | ICD-10-CM | POA: Diagnosis not present

## 2021-02-19 DIAGNOSIS — Z6791 Unspecified blood type, Rh negative: Secondary | ICD-10-CM

## 2021-02-19 DIAGNOSIS — Z98891 History of uterine scar from previous surgery: Secondary | ICD-10-CM

## 2021-02-19 DIAGNOSIS — O1414 Severe pre-eclampsia complicating childbirth: Secondary | ICD-10-CM | POA: Diagnosis not present

## 2021-02-19 DIAGNOSIS — O09523 Supervision of elderly multigravida, third trimester: Secondary | ICD-10-CM

## 2021-02-19 DIAGNOSIS — Z302 Encounter for sterilization: Secondary | ICD-10-CM

## 2021-02-19 DIAGNOSIS — O360130 Maternal care for anti-D [Rh] antibodies, third trimester, not applicable or unspecified: Secondary | ICD-10-CM

## 2021-02-19 DIAGNOSIS — O99019 Anemia complicating pregnancy, unspecified trimester: Secondary | ICD-10-CM

## 2021-02-19 DIAGNOSIS — O139 Gestational [pregnancy-induced] hypertension without significant proteinuria, unspecified trimester: Secondary | ICD-10-CM

## 2021-02-19 DIAGNOSIS — Z3A00 Weeks of gestation of pregnancy not specified: Secondary | ICD-10-CM | POA: Diagnosis not present

## 2021-02-19 DIAGNOSIS — O0990 Supervision of high risk pregnancy, unspecified, unspecified trimester: Secondary | ICD-10-CM

## 2021-02-19 DIAGNOSIS — O1413 Severe pre-eclampsia, third trimester: Secondary | ICD-10-CM

## 2021-02-19 DIAGNOSIS — O26893 Other specified pregnancy related conditions, third trimester: Secondary | ICD-10-CM | POA: Diagnosis present

## 2021-02-19 DIAGNOSIS — D62 Acute posthemorrhagic anemia: Secondary | ICD-10-CM | POA: Diagnosis not present

## 2021-02-19 DIAGNOSIS — Z20822 Contact with and (suspected) exposure to covid-19: Secondary | ICD-10-CM | POA: Diagnosis not present

## 2021-02-19 DIAGNOSIS — Z9079 Acquired absence of other genital organ(s): Secondary | ICD-10-CM

## 2021-02-19 DIAGNOSIS — Z3A37 37 weeks gestation of pregnancy: Secondary | ICD-10-CM | POA: Diagnosis not present

## 2021-02-19 DIAGNOSIS — O0993 Supervision of high risk pregnancy, unspecified, third trimester: Secondary | ICD-10-CM

## 2021-02-19 DIAGNOSIS — T8189XA Other complications of procedures, not elsewhere classified, initial encounter: Secondary | ICD-10-CM

## 2021-02-19 DIAGNOSIS — O134 Gestational [pregnancy-induced] hypertension without significant proteinuria, complicating childbirth: Secondary | ICD-10-CM | POA: Diagnosis not present

## 2021-02-19 LAB — CBC
HCT: 31.8 % — ABNORMAL LOW (ref 36.0–46.0)
Hemoglobin: 10.4 g/dL — ABNORMAL LOW (ref 12.0–15.0)
MCH: 28 pg (ref 26.0–34.0)
MCHC: 32.7 g/dL (ref 30.0–36.0)
MCV: 85.5 fL (ref 80.0–100.0)
Platelets: 279 10*3/uL (ref 150–400)
RBC: 3.72 MIL/uL — ABNORMAL LOW (ref 3.87–5.11)
RDW: 14.5 % (ref 11.5–15.5)
WBC: 10.1 10*3/uL (ref 4.0–10.5)
nRBC: 0 % (ref 0.0–0.2)

## 2021-02-19 LAB — COMPREHENSIVE METABOLIC PANEL
ALT: 9 U/L (ref 0–44)
AST: 18 U/L (ref 15–41)
Albumin: 2.7 g/dL — ABNORMAL LOW (ref 3.5–5.0)
Alkaline Phosphatase: 123 U/L (ref 38–126)
Anion gap: 9 (ref 5–15)
BUN: <5 mg/dL — ABNORMAL LOW (ref 6–20)
CO2: 21 mmol/L — ABNORMAL LOW (ref 22–32)
Calcium: 8.5 mg/dL — ABNORMAL LOW (ref 8.9–10.3)
Chloride: 107 mmol/L (ref 98–111)
Creatinine, Ser: 0.5 mg/dL (ref 0.44–1.00)
GFR, Estimated: >60 mL/min (ref >60)
Glucose, Bld: 98 mg/dL (ref 70–99)
Potassium: 3.1 mmol/L — ABNORMAL LOW (ref 3.5–5.1)
Sodium: 137 mmol/L (ref 135–145)
Total Bilirubin: 0.6 mg/dL (ref 0.3–1.2)
Total Protein: 6.2 g/dL — ABNORMAL LOW (ref 6.5–8.1)

## 2021-02-19 LAB — PROTEIN / CREATININE RATIO, URINE
Creatinine, Urine: 137.11 mg/dL
Protein Creatinine Ratio: 0.15 mg/mg{Cre} (ref 0.00–0.15)
Total Protein, Urine: 20 mg/dL

## 2021-02-19 LAB — RESP PANEL BY RT-PCR (FLU A&B, COVID) ARPGX2
Influenza A by PCR: NEGATIVE
Influenza B by PCR: NEGATIVE
SARS Coronavirus 2 by RT PCR: NEGATIVE

## 2021-02-19 LAB — PREPARE RBC (CROSSMATCH)

## 2021-02-19 SURGERY — Surgical Case
Anesthesia: Spinal | Wound class: Clean Contaminated

## 2021-02-19 MED ORDER — PHENYLEPHRINE HCL-NACL 20-0.9 MG/250ML-% IV SOLN
INTRAVENOUS | Status: DC | PRN
  Administered 2021-02-19: 60 ug/min via INTRAVENOUS

## 2021-02-19 MED ORDER — LACTATED RINGERS IV SOLN: INTRAVENOUS | Status: DC

## 2021-02-19 MED ORDER — DEXAMETHASONE SODIUM PHOSPHATE 10 MG/ML IJ SOLN
INTRAMUSCULAR | Status: DC | PRN
  Administered 2021-02-19: 10 mg via INTRAVENOUS

## 2021-02-19 MED ORDER — NALBUPHINE HCL 10 MG/ML IJ SOLN: 5.0000 mg | INTRAMUSCULAR | Status: DC | PRN

## 2021-02-19 MED ORDER — OXYCODONE HCL 5 MG PO TABS: 5.0000 mg | ORAL_TABLET | Freq: Four times a day (QID) | ORAL | Status: DC | PRN

## 2021-02-19 MED ORDER — OXYCODONE-ACETAMINOPHEN 5-325 MG PO TABS: 2.0000 | ORAL_TABLET | ORAL | Status: DC | PRN

## 2021-02-19 MED ORDER — SODIUM CHLORIDE 0.9% FLUSH: 3.0000 mL | INTRAVENOUS | Status: DC | PRN

## 2021-02-19 MED ORDER — MENTHOL 3 MG MT LOZG: 1.0000 | LOZENGE | OROMUCOSAL | Status: DC | PRN

## 2021-02-19 MED ORDER — ONDANSETRON HCL 4 MG/2ML IJ SOLN: 4.0000 mg | Freq: Three times a day (TID) | INTRAMUSCULAR | Status: DC | PRN

## 2021-02-19 MED ORDER — MORPHINE SULFATE (PF) 0.5 MG/ML IJ SOLN
INTRAMUSCULAR | Status: AC
  Filled 2021-02-19: qty 10

## 2021-02-19 MED ORDER — HYDROMORPHONE HCL 1 MG/ML IJ SOLN: 0.2500 mg | INTRAMUSCULAR | Status: DC | PRN

## 2021-02-19 MED ORDER — PHENYLEPHRINE HCL (PRESSORS) 10 MG/ML IV SOLN
INTRAVENOUS | Status: DC | PRN
  Administered 2021-02-19: 80 ug via INTRAVENOUS
  Administered 2021-02-19 (×2): 120 ug via INTRAVENOUS
  Administered 2021-02-19: 80 ug via INTRAVENOUS

## 2021-02-19 MED ORDER — LACTATED RINGERS IV SOLN: 500.0000 mL | INTRAVENOUS | Status: DC | PRN

## 2021-02-19 MED ORDER — IBUPROFEN 600 MG PO TABS
600.0000 mg | ORAL_TABLET | Freq: Four times a day (QID) | ORAL | Status: DC
  Administered 2021-02-20 – 2021-02-22 (×9): 600 mg via ORAL
  Filled 2021-02-19 (×8): qty 1

## 2021-02-19 MED ORDER — MEPERIDINE HCL 25 MG/ML IJ SOLN: 6.2500 mg | INTRAMUSCULAR | Status: DC | PRN

## 2021-02-19 MED ORDER — PHENYLEPHRINE 40 MCG/ML (10ML) SYRINGE FOR IV PUSH (FOR BLOOD PRESSURE SUPPORT)
PREFILLED_SYRINGE | INTRAVENOUS | Status: AC
  Filled 2021-02-19: qty 10

## 2021-02-19 MED ORDER — PRENATAL MULTIVITAMIN CH
1.0000 | ORAL_TABLET | Freq: Every day | ORAL | Status: DC
  Administered 2021-02-20 – 2021-02-22 (×3): 1 via ORAL
  Filled 2021-02-19 (×3): qty 1

## 2021-02-19 MED ORDER — ALBUMIN HUMAN 5 % IV SOLN
INTRAVENOUS | Status: AC
  Filled 2021-02-19: qty 250

## 2021-02-19 MED ORDER — NALBUPHINE HCL 10 MG/ML IJ SOLN: 5.0000 mg | Freq: Once | INTRAMUSCULAR | Status: DC | PRN

## 2021-02-19 MED ORDER — OXYTOCIN-SODIUM CHLORIDE 30-0.9 UT/500ML-% IV SOLN: 2.5000 [IU]/h | INTRAVENOUS | Status: DC

## 2021-02-19 MED ORDER — OXYCODONE-ACETAMINOPHEN 5-325 MG PO TABS: 1.0000 | ORAL_TABLET | ORAL | Status: DC | PRN

## 2021-02-19 MED ORDER — SCOPOLAMINE 1 MG/3DAYS TD PT72: 1.0000 | MEDICATED_PATCH | Freq: Once | TRANSDERMAL | Status: DC

## 2021-02-19 MED ORDER — LABETALOL HCL 5 MG/ML IV SOLN: 80.0000 mg | INTRAVENOUS | Status: DC | PRN

## 2021-02-19 MED ORDER — KETOROLAC TROMETHAMINE 30 MG/ML IJ SOLN
30.0000 mg | Freq: Four times a day (QID) | INTRAMUSCULAR | Status: DC | PRN
  Administered 2021-02-19: 30 mg via INTRAVENOUS

## 2021-02-19 MED ORDER — ENOXAPARIN SODIUM 40 MG/0.4ML IJ SOSY
40.0000 mg | PREFILLED_SYRINGE | INTRAMUSCULAR | Status: DC
  Administered 2021-02-20 – 2021-02-21 (×2): 40 mg via SUBCUTANEOUS
  Filled 2021-02-19 (×2): qty 0.4

## 2021-02-19 MED ORDER — NALOXONE HCL 4 MG/10ML IJ SOLN
1.0000 ug/kg/h | INTRAVENOUS | Status: DC | PRN
  Filled 2021-02-19: qty 5

## 2021-02-19 MED ORDER — FENTANYL CITRATE (PF) 100 MCG/2ML IJ SOLN
INTRAMUSCULAR | Status: AC
  Filled 2021-02-19: qty 2

## 2021-02-19 MED ORDER — OXYTOCIN-SODIUM CHLORIDE 30-0.9 UT/500ML-% IV SOLN
INTRAVENOUS | Status: DC | PRN
  Administered 2021-02-19: 300 mL via INTRAVENOUS

## 2021-02-19 MED ORDER — GABAPENTIN 100 MG PO CAPS
100.0000 mg | ORAL_CAPSULE | Freq: Two times a day (BID) | ORAL | Status: DC
  Administered 2021-02-20 – 2021-02-22 (×5): 100 mg via ORAL
  Filled 2021-02-19 (×5): qty 1

## 2021-02-19 MED ORDER — ACETAMINOPHEN 500 MG PO TABS
1000.0000 mg | ORAL_TABLET | Freq: Four times a day (QID) | ORAL | Status: DC
  Administered 2021-02-20 (×3): 1000 mg via ORAL
  Filled 2021-02-19 (×3): qty 2

## 2021-02-19 MED ORDER — DIBUCAINE (PERIANAL) 1 % EX OINT: 1.0000 "application " | TOPICAL_OINTMENT | CUTANEOUS | Status: DC | PRN

## 2021-02-19 MED ORDER — OXYTOCIN-SODIUM CHLORIDE 30-0.9 UT/500ML-% IV SOLN
INTRAVENOUS | Status: AC
  Filled 2021-02-19: qty 500

## 2021-02-19 MED ORDER — OXYTOCIN-SODIUM CHLORIDE 30-0.9 UT/500ML-% IV SOLN
2.5000 [IU]/h | INTRAVENOUS | Status: DC
  Administered 2021-02-20: 2.5 [IU]/h via INTRAVENOUS
  Filled 2021-02-19: qty 500

## 2021-02-19 MED ORDER — WITCH HAZEL-GLYCERIN EX PADS: 1.0000 "application " | MEDICATED_PAD | CUTANEOUS | Status: DC | PRN

## 2021-02-19 MED ORDER — POLYETHYLENE GLYCOL 3350 17 G PO PACK
17.0000 g | PACK | Freq: Every day | ORAL | Status: DC
  Administered 2021-02-20: 17 g via ORAL
  Filled 2021-02-19: qty 1

## 2021-02-19 MED ORDER — LIDOCAINE HCL (PF) 1 % IJ SOLN: 30.0000 mL | INTRAMUSCULAR | Status: DC | PRN

## 2021-02-19 MED ORDER — ACETAMINOPHEN 325 MG PO TABS
650.0000 mg | ORAL_TABLET | ORAL | Status: DC | PRN
  Administered 2021-02-19: 650 mg via ORAL
  Filled 2021-02-19 (×2): qty 2

## 2021-02-19 MED ORDER — DIPHENHYDRAMINE HCL 25 MG PO CAPS: 25.0000 mg | ORAL_CAPSULE | ORAL | Status: DC | PRN

## 2021-02-19 MED ORDER — PROMETHAZINE HCL 25 MG/ML IJ SOLN
6.2500 mg | INTRAMUSCULAR | Status: DC | PRN
Start: 2021-02-19 — End: 2021-02-19

## 2021-02-19 MED ORDER — SCOPOLAMINE 1 MG/3DAYS TD PT72
MEDICATED_PATCH | TRANSDERMAL | Status: DC | PRN
  Administered 2021-02-19: 1 via TRANSDERMAL

## 2021-02-19 MED ORDER — SCOPOLAMINE 1 MG/3DAYS TD PT72
MEDICATED_PATCH | TRANSDERMAL | Status: AC
  Filled 2021-02-19: qty 1

## 2021-02-19 MED ORDER — DIPHENHYDRAMINE HCL 50 MG/ML IJ SOLN: 12.5000 mg | INTRAMUSCULAR | Status: DC | PRN

## 2021-02-19 MED ORDER — TETANUS-DIPHTH-ACELL PERTUSSIS 5-2.5-18.5 LF-MCG/0.5 IM SUSY: 0.5000 mL | PREFILLED_SYRINGE | Freq: Once | INTRAMUSCULAR | Status: DC

## 2021-02-19 MED ORDER — ALBUMIN HUMAN 5 % IV SOLN: INTRAVENOUS | Status: DC | PRN

## 2021-02-19 MED ORDER — OXYTOCIN BOLUS FROM INFUSION: 333.0000 mL | Freq: Once | INTRAVENOUS | Status: DC

## 2021-02-19 MED ORDER — ONDANSETRON HCL 4 MG/2ML IJ SOLN: 4.0000 mg | Freq: Four times a day (QID) | INTRAMUSCULAR | Status: DC | PRN

## 2021-02-19 MED ORDER — KETOROLAC TROMETHAMINE 30 MG/ML IJ SOLN: 30.0000 mg | Freq: Once | INTRAMUSCULAR | Status: DC | PRN

## 2021-02-19 MED ORDER — ONDANSETRON HCL 4 MG/2ML IJ SOLN
INTRAMUSCULAR | Status: DC | PRN
  Administered 2021-02-19: 4 mg via INTRAVENOUS

## 2021-02-19 MED ORDER — SIMETHICONE 80 MG PO CHEW
80.0000 mg | CHEWABLE_TABLET | Freq: Three times a day (TID) | ORAL | Status: DC
  Administered 2021-02-20 – 2021-02-22 (×6): 80 mg via ORAL
  Filled 2021-02-19 (×6): qty 1

## 2021-02-19 MED ORDER — SOD CITRATE-CITRIC ACID 500-334 MG/5ML PO SOLN
30.0000 mL | ORAL | Status: DC | PRN
  Filled 2021-02-19: qty 15

## 2021-02-19 MED ORDER — HYDRALAZINE HCL 20 MG/ML IJ SOLN: 10.0000 mg | INTRAMUSCULAR | Status: DC | PRN

## 2021-02-19 MED ORDER — MORPHINE SULFATE (PF) 0.5 MG/ML IJ SOLN
INTRAMUSCULAR | Status: DC | PRN
  Administered 2021-02-19: .15 mg via INTRATHECAL

## 2021-02-19 MED ORDER — ZOLPIDEM TARTRATE 5 MG PO TABS: 5.0000 mg | ORAL_TABLET | Freq: Every evening | ORAL | Status: DC | PRN

## 2021-02-19 MED ORDER — NALOXONE HCL 0.4 MG/ML IJ SOLN
0.4000 mg | INTRAMUSCULAR | Status: DC | PRN
Start: 2021-02-19 — End: 2021-02-22

## 2021-02-19 MED ORDER — CEFAZOLIN SODIUM-DEXTROSE 2-4 GM/100ML-% IV SOLN
2.0000 g | INTRAVENOUS | Status: AC
  Administered 2021-02-19: 2 g via INTRAVENOUS
  Filled 2021-02-19: qty 100

## 2021-02-19 MED ORDER — LABETALOL HCL 5 MG/ML IV SOLN: 20.0000 mg | INTRAVENOUS | Status: DC | PRN

## 2021-02-19 MED ORDER — BUPIVACAINE IN DEXTROSE 0.75-8.25 % IT SOLN
INTRATHECAL | Status: DC | PRN
  Administered 2021-02-19: 1.6 mL via INTRATHECAL

## 2021-02-19 MED ORDER — FENTANYL CITRATE (PF) 100 MCG/2ML IJ SOLN
INTRAMUSCULAR | Status: DC | PRN
  Administered 2021-02-19: 15 ug via INTRATHECAL

## 2021-02-19 MED ORDER — SOD CITRATE-CITRIC ACID 500-334 MG/5ML PO SOLN
30.0000 mL | ORAL | Status: AC
  Administered 2021-02-19: 30 mL via ORAL

## 2021-02-19 MED ORDER — LABETALOL HCL 5 MG/ML IV SOLN: 40.0000 mg | INTRAVENOUS | Status: DC | PRN

## 2021-02-19 MED ORDER — SODIUM CHLORIDE 0.9% IV SOLUTION: Freq: Once | INTRAVENOUS | Status: AC

## 2021-02-19 MED ORDER — DIPHENHYDRAMINE HCL 25 MG PO CAPS: 25.0000 mg | ORAL_CAPSULE | Freq: Four times a day (QID) | ORAL | Status: DC | PRN

## 2021-02-19 MED ORDER — KETOROLAC TROMETHAMINE 30 MG/ML IJ SOLN
INTRAMUSCULAR | Status: AC
  Filled 2021-02-19: qty 1

## 2021-02-19 MED ORDER — COCONUT OIL OIL: 1.0000 "application " | TOPICAL_OIL | Status: DC | PRN

## 2021-02-19 MED ORDER — KETOROLAC TROMETHAMINE 30 MG/ML IJ SOLN: 30.0000 mg | Freq: Four times a day (QID) | INTRAMUSCULAR | Status: DC | PRN

## 2021-02-19 SURGICAL SUPPLY — 43 items
ADH SKN CLS APL DERMABOND .7 (GAUZE/BANDAGES/DRESSINGS)
APL SKNCLS STERI-STRIP NONHPOA (GAUZE/BANDAGES/DRESSINGS) ×2
BENZOIN TINCTURE PRP APPL 2/3 (GAUZE/BANDAGES/DRESSINGS) ×3 IMPLANT
CHLORAPREP W/TINT 26ML (MISCELLANEOUS) ×3 IMPLANT
CLAMP CORD UMBIL (MISCELLANEOUS) IMPLANT
CLOTH BEACON ORANGE TIMEOUT ST (SAFETY) ×3 IMPLANT
DERMABOND ADVANCED (GAUZE/BANDAGES/DRESSINGS)
DERMABOND ADVANCED .7 DNX12 (GAUZE/BANDAGES/DRESSINGS) IMPLANT
DRSG OPSITE POSTOP 4X10 (GAUZE/BANDAGES/DRESSINGS) ×3 IMPLANT
ELECT REM PT RETURN 9FT ADLT (ELECTROSURGICAL) ×3
ELECTRODE REM PT RTRN 9FT ADLT (ELECTROSURGICAL) ×2 IMPLANT
EXTRACTOR VACUUM KIWI (MISCELLANEOUS) IMPLANT
GLOVE BIOGEL PI IND STRL 7.0 (GLOVE) ×6 IMPLANT
GLOVE BIOGEL PI INDICATOR 7.0 (GLOVE) ×3
GLOVE ECLIPSE 6.5 STRL STRAW (GLOVE) ×3 IMPLANT
GOWN STRL REUS W/TWL LRG LVL3 (GOWN DISPOSABLE) ×9 IMPLANT
KIT ABG SYR 3ML LUER SLIP (SYRINGE) IMPLANT
NDL HYPO 25X5/8 SAFETYGLIDE (NEEDLE) IMPLANT
NEEDLE HYPO 25X5/8 SAFETYGLIDE (NEEDLE) IMPLANT
NS IRRIG 1000ML POUR BTL (IV SOLUTION) ×3 IMPLANT
PACK C SECTION WH (CUSTOM PROCEDURE TRAY) ×3 IMPLANT
PAD ABD 7.5X8 STRL (GAUZE/BANDAGES/DRESSINGS) ×3 IMPLANT
PAD ABD DERMACEA PRESS 5X9 (GAUZE/BANDAGES/DRESSINGS) ×1 IMPLANT
PAD OB MATERNITY 4.3X12.25 (PERSONAL CARE ITEMS) ×3 IMPLANT
PENCIL SMOKE EVAC W/HOLSTER (ELECTROSURGICAL) ×3 IMPLANT
RTRCTR C-SECT PINK 25CM LRG (MISCELLANEOUS) ×3 IMPLANT
SPONGE GAUZE 4X4 STERILE 39 (GAUZE/BANDAGES/DRESSINGS) ×2 IMPLANT
STRIP CLOSURE SKIN 1/2X4 (GAUZE/BANDAGES/DRESSINGS) ×3 IMPLANT
SUT CHROMIC 1 CT1 27 (SUTURE) ×5 IMPLANT
SUT PLAIN 0 NONE (SUTURE) IMPLANT
SUT PLAIN 2 0 XLH (SUTURE) IMPLANT
SUT VIC AB 0 CT1 27 (SUTURE) ×6
SUT VIC AB 0 CT1 27XBRD ANBCTR (SUTURE) ×4 IMPLANT
SUT VIC AB 0 CTX 36 (SUTURE) ×9
SUT VIC AB 0 CTX36XBRD ANBCTRL (SUTURE) ×6 IMPLANT
SUT VIC AB 2-0 CT1 27 (SUTURE) ×3
SUT VIC AB 2-0 CT1 TAPERPNT 27 (SUTURE) ×2 IMPLANT
SUT VIC AB 4-0 KS 27 (SUTURE) ×3 IMPLANT
SUT VICRYL 2 0 18  TIES (SUTURE) ×3
SUT VICRYL 2 0 18 TIES (SUTURE) IMPLANT
TOWEL OR 17X24 6PK STRL BLUE (TOWEL DISPOSABLE) ×3 IMPLANT
TRAY FOLEY W/BAG SLVR 14FR LF (SET/KITS/TRAYS/PACK) IMPLANT
WATER STERILE IRR 1000ML POUR (IV SOLUTION) ×3 IMPLANT

## 2021-02-19 NOTE — Progress Notes (Signed)
Formal US results back. Dr. Charlotta Newton notified of incomplete breech presentation, at bedside to discuss with pt.

## 2021-02-19 NOTE — Lactation Note (Signed)
This note was copied from a baby's chart. Lactation Consultation Note Late entry: Spoke w/mom about pumping since baby was in NICU. Mom in agreement. Mom shown how to use DEBP & how to disassemble, clean, & reassemble parts.  Mom knows to pump q3h for 15-20 min. Mom pumped getting a little glistening on the flange. NICU book given to mom. Milk storage for NICU baby's discussed. Bottles given to mom.  Newborn feeding habits, STS, importance of I&O, breast massage, making hands free bra Mom encouraged to feed baby 8-12 times/24 hours and with feeding cues.   Mom stated she BF for 6 weeks until she went back to work. Mom stated she tried pumping but she didn't get much when she pumped. It wasn't enough to make a good storage of milk. Some things to do to assist in increasing milk supply discussed.  Lactation brochure given.  Patient Name: Kathy Aguilar CVELF'Y Date: 02/19/2021 Reason for consult: Initial assessment;Early term 37-38.6wks Age:58 hours  Maternal Data Does the patient have breastfeeding experience prior to this delivery?: Yes How long did the patient breastfeed?: 6 weeks  Feeding Nipple Type: Slow - flow  LATCH Score       Type of Nipple: Everted at rest and after stimulation            Lactation Tools Discussed/Used Tools: Pump Breast pump type: Double-Electric Breast Pump Pump Education: Setup, frequency, and cleaning;Milk Storage Reason for Pumping: nicu Pumping frequency: Q 3 hr  Interventions Interventions: DEBP;Breast compression;Support pillows  Discharge WIC Program: No  Consult Status Consult Status: Follow-up Date: 02/20/21 Follow-up type: In-patient    Charyl Dancer 02/19/2021, 11:59 PM

## 2021-02-19 NOTE — Progress Notes (Signed)
Patient seen and assessed by nursing staff.  Agree with documentation and plan.  

## 2021-02-19 NOTE — Transfer of Care (Signed)
Immediate Anesthesia Transfer of Care Note  Patient: Kathy Aguilar  Procedure(s) Performed: CESAREAN SECTION WITH BILATERAL TUBAL LIGATION (Bilateral )  Patient Location: PACU  Anesthesia Type:Spinal  Level of Consciousness: awake, alert  and oriented  Airway & Oxygen Therapy: Patient Spontanous Breathing  Post-op Assessment: Report given to RN and Post -op Vital signs reviewed and stable  Post vital signs: Reviewed and stable  Last Vitals:  Vitals Value Taken Time  BP 100/66 02/19/21  1930  Temp    Pulse 105 02/19/21   1930   Resp 19 02/19/21    1930   SpO2 98 02/19/21   1930    Last Pain:  Vitals:   02/19/21 1633  TempSrc: Oral  PainSc: 0-No pain      Patients Stated Pain Goal: 4 (02/19/21 1633)  Complications: No complications documented.

## 2021-02-19 NOTE — Progress Notes (Signed)
BP is up > 4 hours apart and over 37 wks. Will admit for IOL

## 2021-02-19 NOTE — Brief Op Note (Signed)
02/19/2021  7:15 PM  PATIENT:  Kathy Aguilar  35 y.o. female  PRE-OPERATIVE DIAGNOSIS:  37/1. Breech. GHTN. Desire for c-section. Desire for permanent sterilization   POST-OPERATIVE DIAGNOSIS:  Same. Right uterine artery extension. PPH  PROCEDURE:  Primary c-section and left [place] tubal ligation and repair of right uterine artery extension.   SURGEON:  Surgeon(s) and Role:    * Sakiyah Shur, Billey Gosling, MD - Primary   ASSISTANTS: none   ANESTHESIA:   spinal  EBL:  via Triton  IVF: crystalloid, albumin  BLOOD ADMINISTERED:1U PRBC and 1U FFP ordered for the PACU  DRAINS: indwelling foley UOP (clear)   LOCAL MEDICATIONS USED:  NONE  SPECIMEN:  Placenta  DISPOSITION OF SPECIMEN:  L&D  COUNTS:  YES  TOURNIQUET:  * No tourniquets in log *  DICTATION: .Note written in EPIC  PLAN OF CARE: Admit to inpatient   PATIENT DISPOSITION:  PACU - hemodynamically stable.   Delay start of Pharmacological VTE agent (>24hrs) due to surgical blood loss or risk of bleeding: not applicable  Cornelia Copa MD Attending Center for [facility] (Faculty Practice) 02/19/2021 Time: [phone]

## 2021-02-19 NOTE — Progress Notes (Signed)
Dr. Charlotta Newton at bedside discussing risks and benefits of c/s and consenting patient with Donette Larry CNM. ECV offered and refused by patient.

## 2021-02-19 NOTE — H&P (Addendum)
OBSTETRIC ADMISSION HISTORY AND PHYSICAL  Kathy Aguilar is a 35 y.o. female [phone] with IUP at [redacted]w[redacted]d by LMP sent from office for IOL for gHTN vs preeclampsia. She reports +FMs, No LOF, no VB, no blurry vision, or RUQ pain. She was sent from the office due to severe range pressure with headache which she has had since last night. She rates her headache as 7/10 and describes it as a band across her forehead. She took Tylenol last night but has had nothing today. She plans on breast feeding. She request right sided tubal for birth control.  She received her prenatal care at [facility].    Dating: By LMP verified by [facility] 08/23/2020 --->  Estimated Date of Delivery: 03/11/21  Sono:    @[redacted]w[redacted]d , CWD, normal anatomy, transverse presentation head to maternal right, right lateral placental lie, 625g, 84% EFW   Prenatal History/Complications:  - HTN - AMA - Rh negative - Anemia  Past Medical History: Past Medical History:  Diagnosis Date   Asthma    [place]   Family history of adverse reaction to anesthesia    mother has vomiting after anesthesia   Family history of breast cancer 03/18/2017   Mom at age 60. States mom had negative genetic testing.    Migraine without aura     Past Surgical History: Past Surgical History:  Procedure Laterality Date   BILATERAL SALPINGECTOMY Right 08/17/2016   Procedure: RIGHT SALPINGECTOMY;  Surgeon: 13/04/2016, MD;  Location: [facility];  Service: Gynecology;  Laterality: Right;   CHROMOPERTUBATION Bilateral 08/17/2016   Procedure: CHROMOPERTUBATION;  Surgeon: 13/04/2016, MD;  Location: [facility];  Service: Gynecology;  Laterality: Bilateral;   LAPAROSCOPIC APPENDECTOMY N/A 01/14/2016   Procedure: APPENDECTOMY LAPAROSCOPIC;  Surgeon: 03/15/2016, MD;  Location: [facility] ORS;  Service: General;  Laterality: N/A;   LAPAROSCOPIC LYSIS OF ADHESIONS  08/17/2016   Procedure: LAPAROSCOPIC LYSIS OF ADHESIONS;  Surgeon: 13/04/2016, MD;  Location: [facility];   Service: Gynecology;;   LAPAROSCOPY N/A 08/17/2016   Procedure: LAPAROSCOPY DIAGNOSTIC with left paratubal cystectomy;  Surgeon: 13/04/2016, MD;  Location: [facility];  Service: Gynecology;  Laterality: N/A;   SALPINGECTOMY Right    SURGICAL ENTRY ABOVE IS INCORRECT    Obstetrical History: OB History     Gravida  4   Para  2   Term  2   Preterm  0   AB  1   Living  2      SAB  1   IAB  0   Ectopic  0   Multiple  0   Live Births  2           Social History Social History   Socioeconomic History   Marital status: Married    Spouse name: Not on file   Number of children: Not on file   Years of education: Not on file   Highest education level: Not on file  Occupational History   Not on file  Tobacco Use   Smoking status: Never Smoker   Smokeless tobacco: Never Used  Vaping Use   Vaping Use: Never used  Substance and Sexual Activity   Alcohol use: No   Drug use: No   Sexual activity: Yes    Partners: Male    Birth control/protection: None  Other Topics Concern   Not on file  Social History Narrative   Not on file   Social Determinants of Health   Financial  Resource Strain: Not on file  Food Insecurity: Not on file  Transportation Needs: Not on file  Physical Activity: Not on file  Stress: Not on file  Social Connections: Not on file    Family History: Family History  Problem Relation Age of Onset   Breast cancer Mother 65   Osteoarthritis Maternal Grandmother    Hypertension Maternal Grandmother    Cancer Maternal Grandmother        blood cancer?   Kidney disease Maternal Grandmother    Cancer Maternal Grandfather 27       prostate cancer   Kidney disease Paternal Grandmother     Allergies: Allergies  Allergen Reactions   Sumatriptan Rash    Medications Prior to Admission  Medication Sig Dispense Refill Last Dose   ferrous sulfate (FERROUSUL) 325 (65 FE) MG tablet Take 1 tablet (325 mg total) by mouth every other day. 60  tablet 1 02/17/21 at 1900   Prenatal MV-Min-Fe Fum-FA-DHA (PRENATAL 1 PO) Take 1 tablet by mouth daily.   Past Week at Unknown time   cyclobenzaprine (FLEXERIL) 10 MG tablet Take 1 tablet (10 mg total) by mouth every 8 (eight) hours as needed for muscle spasms. (Patient not taking: No sig reported) 30 tablet 1 More than a month at Unknown time   Review of Systems   All systems reviewed and negative except as stated in HPI  Temperature 97.6 F (36.4 C), temperature source Oral, resp. rate 18, last menstrual period 06/04/2020. General appearance: alert, cooperative and no distress Lungs: comfortable on room air Pelvic: See below Extremities: Homans sign is negative, no sign of DVT Presentation: incomplete breech Fetal monitoringBaseline: 130 bpm, Variability: moderate, Accelerations: Reactive and Decelerations: Absent Uterine activity: rare Dilation: Closed Effacement (%): Thick  Prenatal labs: ABO, Rh: A/Negative/-- (11/03 1606) Antibody: Negative (03/09 1045) Rubella: 1.32 (11/03 1606) RPR: Non Reactive (03/09 1045)  HBsAg: Negative (11/03 1606)  HIV: Non Reactive (03/09 1045)  GBS: Negative/-- (05/04 1630)  1 hr Glucola- negative Genetic screening: NIPS low risk, AFP negative Anatomy US normal  Prenatal Transfer Tool  Maternal Diabetes: No Genetic Screening: Normal Maternal Ultrasounds/Referrals: Normal Fetal Ultrasounds or other Referrals:  Referred to Materal Fetal Medicine  Maternal Substance Abuse:  No Significant Maternal Medications:  None Significant Maternal Lab Results: Group B Strep negative  No results found for this or any previous visit (from the past 24 hour(s)).  Patient Active Problem List   Diagnosis Date Noted   Preeclampsia, severe, third trimester 02/19/2021   H/O unilateral salpingectomy- Right removed 12/19/2020   Anemia affecting pregnancy, antepartum 12/18/2020   Supervision of high risk pregnancy, antepartum 08/13/2020   AMA (advanced  maternal age) multigravida 35+, second trimester 08/13/2020   Headache disorder 02/16/2020   Rh negative state in antepartum period 03/18/2017    Assessment/Plan:  Kathy Aguilar is a 35 y.o. [id number] at [redacted]w[redacted]d here for IOL due to gHTN.   #Labor: latent #Pain: Planning epidural  #FWB: Cat 1 #ID:  GBS negative  #MOF: Breast feeding  #MOC: right sided tubal ligation #Circ:  N/A #gestational hypertension:  PreE labs pending #headache: Tylenol 650 mg given   Alda Ponder, Student-PA  02/19/2021, 11:00 AM   Midwife attestation: I have seen and examined this patient; I agree with above documentation in the student's note.   PE: Gen: calm comfortable, NAD Resp: normal effort and rate Abd: gravid  ROS, labs, PMH reviewed  Assessment/Plan: 37.[redacted] weeks gestation FWB: Cat I GBS: neg GHTN: labs  pending, treat HA, consider Mg if applicable Admit to LD  Donette Larry, CNM  02/19/2021, 1:45 PM  Attestation of Attending Supervision of Advanced Practice Provider (PA/CNM/NP): Evaluation and management procedures were performed by the Advanced Practice Provider under my supervision and collaboration.  I have reviewed the Advanced Practice Provider's note and chart, and I agree with the management and plan. I have also made any necessary editorial changes.   Sharon Seller, DO Attending Obstetrician & Gynecologist, [facility] for [facility], [facility] 02/19/2021 4:37 PM

## 2021-02-19 NOTE — Progress Notes (Addendum)
BSUS for presentation: ?transverse presentation, will obtain formal US.

## 2021-02-19 NOTE — Progress Notes (Signed)
OB Note I introduced myself to patient and husband and reconfirmed that she does not want to try an ECV and she wants her remaining tube removed. Permnancy of procedure d/w her.  Her Op Note she had the right tube removed so plan for left salpingectomy  Can proceed to OR when OR is ready  Cornelia Copa MD Attending Center for [employer] (Faculty Practice) 02/19/2021 Time: [phone]

## 2021-02-19 NOTE — Op Note (Addendum)
Operative Note   SURGERY DATE: 02/19/2021  PRE-OP DIAGNOSIS:  *Pregnancy at 37/1 *Breech and patient declines ECV *Gestational hypertension *History of right salpingectomy *Desire for permanent sterilization  POST-OP DIAGNOSIS: Same. Delivered. Postpartum hemorrhage   PROCEDURE: primary low transverse cesarean section via pfannenstiel skin incision with double layer uterine closure and repair of right uterine artery laceration. Left parkland tubal ligation  SURGEON: Surgeon(s) and Role:    * Karlin Heilman, Billey Gosling, MD - Primary  ASSISTANT: None  ANESTHESIA: spinal  ESTIMATED BLOOD LOSS: via Triton  DRAINS: UOP via indwelling foley  TOTAL IV FLUIDS: crystalloid and albumin. 1U PRBC and 1U FFP to be given in the PACU.   VTE PROPHYLAXIS: SCDs to bilateral lower extremities  ANTIBIOTICS: Two grams of Cefazolin were given., within 1 hour of skin incision  SPECIMENS: Placenta  COMPLICATIONS: right hysterotomy extension causing right uterine artery laceration  FINDINGS: Mild adhesions on the left tube. Grossly normal uterus, left tube and ovaries; surgically absent right tube. Clear amniotic fluid, frank breech female infant, weight 3190gm, APGARs 7/8, intact placenta.  PROCEDURE IN DETAIL: The patient was taken to the operating room where anesthesia was administered and normal fetal heart tones were confirmed. She was then prepped and draped in the normal fashion in the dorsal supine position with a leftward tilt.  After a time out was performed, a pfannensteil skin incision was made with the scalpel and carried through to the underlying layer of fascia. The fascia was then incised at the midline and this incision was extended laterally with the mayo scissors. Attention was turned to the superior aspect of the fascial incision which was grasped with the kocher clamps x 2, tented up and the rectus muscles were dissected off with the bovie. In a similar fashion the  inferior aspect of the fascial incision was grasped with the kocher clamps, tented up and the rectus muscles dissected off with the mayo scissors. The rectus muscles were then separated in the midline and the peritoneum was entered bluntly. The bladder blade was inserted and the vesicouterine peritoneum was identified, tented up and entered with the metzenbaum scissors. This incision was extended laterally and the bladder flap was created digitally. The bladder blade was reinserted.  A low transverse hysterotomy was made with the scalpel until the endometrial cavity was breached and the amniotic sac ruptured with the Allis clamp, yielding clear amniotic fluid. This incision was extended bluntly and the infant's head, shoulders and body were delivered atraumatically.The cord was clamped x 2 and cut, and the infant was handed to the awaiting pediatricians, after delayed cord clamping was done.  The placenta was then gradually expressed from the uterus and then the uterus was exteriorized and cleared of all clots and debris. The hysterotomy was examined and the extension and laceration noted.  The uterus repaired with a running suture of 1-0 monocryl. A second imbricating layer of 1-0 monocryl suture was then placed. Several figure-of-eight sutures of #1 chromic were added and two O'Leary stitches were added to achieve excellent hemostasis at the right hysterotomy/extension. The right tube was surgically absent and the left tube had some adhesions so a left parkland was done with 1-0 vicryl after making a window in the mesosalpingx and suture ligating x 2 on both ends and removing a portion of the left tube with the scissors; excellent hemostasis noted.   The uterus and adnexa were then returned to the abdomen, and the hysterotomy and all operative sites were reinspected and  excellent hemostasis was noted after irrigation and suction of the abdomen with warm saline.  The fascia was reapproximated with 0 Vicryl in  a simple running fashion bilaterally. The subcutaneous layer was then reapproximated with interrupted sutures of 2-0 plain gut, and the skin was then closed with 4-0 monocryl, in a subcuticular fashion.  The patient  tolerated the procedure well. Sponge, lap, needle, and instrument counts were correct x 2. The patient was transferred to the recovery room awake, alert and breathing independently in stable condition.  Cornelia Copa MD Attending Center for [facility])

## 2021-02-19 NOTE — Progress Notes (Signed)
Reviewed [place] with findings of incomplete breech presentation.  Reviewed plan to proceed with C-section.  Last ate @ 9am today.  Risk benefits and alternatives of cesarean section were discussed with the patient including but not limited to infection, bleeding, damage to bowel , bladder and baby with the need for further surgery. Pt voiced understanding and desires to proceed. Plan to proceed today around 5:30pm.  Myna Hidalgo, DO Attending Obstetrician & Gynecologist, [facility] for [employer], [facility]

## 2021-02-19 NOTE — Progress Notes (Signed)
Subjective:  Kathy Aguilar is a 35 y.o. female here for BP check.   Hypertension ROS: no TIA's, no chest pain on exertion, no dyspnea on exertion, noting swelling of ankles and Headaches unresolved with Tylenol.    Objective:  BP (!) 163/99   Pulse (!) 111   LMP 06/04/2020 (Exact Date)   Appearance alert, well appearing, and in no distress. General exam BP noted to be well controlled today in office.    Assessment:   Blood Pressure elevated..   Plan:  Discussed BP with Dr Shawnie Pons, pt to be admitted to labor and delivery. Marland Kitchen

## 2021-02-19 NOTE — Anesthesia Postprocedure Evaluation (Signed)
Anesthesia Post Note  Patient: Kathy Aguilar  Procedure(s) Performed: CESAREAN SECTION WITH BILATERAL TUBAL LIGATION (Bilateral )     Patient location during evaluation: PACU Anesthesia Type: Spinal Level of consciousness: oriented and awake and alert Pain management: pain level controlled Vital Signs Assessment: post-procedure vital signs reviewed and stable Respiratory status: spontaneous breathing, respiratory function stable and patient connected to nasal cannula oxygen Cardiovascular status: blood pressure returned to baseline and stable Postop Assessment: no headache, no backache and no apparent nausea or vomiting Anesthetic complications: no   No complications documented.  Last Vitals:  Vitals:   02/19/21 2226 02/19/21 2241  BP: 138/86 131/88  Pulse: 90 86  Resp: 18 18  Temp: 36.9 C   SpO2:  100%    Last Pain:  Vitals:   02/19/21 2226  TempSrc: Oral  PainSc:    Pain Goal: Patients Stated Pain Goal: 1 (02/19/21 2033)                 Vester Balthazor

## 2021-02-19 NOTE — Anesthesia Preprocedure Evaluation (Addendum)
Anesthesia Evaluation  Patient identified by MRN, date of birth, ID band Patient awake    Reviewed: Allergy & Precautions, H&P , NPO status , Patient's Chart, lab work & pertinent test results  History of Anesthesia Complications (+) Family history of anesthesia reaction  Airway Mallampati: I       Dental no notable dental hx.    Pulmonary    Pulmonary exam normal        Cardiovascular hypertension, Pt. on medications Normal cardiovascular exam     Neuro/Psych negative psych ROS   GI/Hepatic negative GI ROS, Neg liver ROS,   Endo/Other  negative endocrine ROS  Renal/GU negative Renal ROS  negative genitourinary   Musculoskeletal negative musculoskeletal ROS (+)   Abdominal (+) + obese,   Peds  Hematology  (+) Blood dyscrasia, anemia ,   Anesthesia Other Findings   Reproductive/Obstetrics (+) Pregnancy                            Anesthesia Physical Anesthesia Plan  ASA: III  Anesthesia Plan: Spinal   Post-op Pain Management:    Induction:   PONV Risk Score and Plan: 3 and Ondansetron, Dexamethasone and Scopolamine patch - Pre-op  Airway Management Planned: Natural Airway and Nasal Cannula  Additional Equipment: None  Intra-op Plan:   Post-operative Plan:   Informed Consent: I have reviewed the patients History and Physical, chart, labs and discussed the procedure including the risks, benefits and alternatives for the proposed anesthesia with the patient or authorized representative who has indicated his/her understanding and acceptance.       Plan Discussed with: CRNA  Anesthesia Plan Comments:        Anesthesia Quick Evaluation

## 2021-02-20 ENCOUNTER — Encounter (HOSPITAL_COMMUNITY): Payer: Self-pay | Admitting: Obstetrics and Gynecology

## 2021-02-20 LAB — PREPARE FRESH FROZEN PLASMA

## 2021-02-20 LAB — CBC
HCT: 23.2 % — ABNORMAL LOW (ref 36.0–46.0)
HCT: 24.6 % — ABNORMAL LOW (ref 36.0–46.0)
Hemoglobin: 7.8 g/dL — ABNORMAL LOW (ref 12.0–15.0)
Hemoglobin: 8.3 g/dL — ABNORMAL LOW (ref 12.0–15.0)
MCH: 28.6 pg (ref 26.0–34.0)
MCH: 29 pg (ref 26.0–34.0)
MCHC: 33.6 g/dL (ref 30.0–36.0)
MCHC: 33.7 g/dL (ref 30.0–36.0)
MCV: 84.8 fL (ref 80.0–100.0)
MCV: 86.2 fL (ref 80.0–100.0)
Platelets: 216 10*3/uL (ref 150–400)
Platelets: 217 10*3/uL (ref 150–400)
RBC: 2.69 MIL/uL — ABNORMAL LOW (ref 3.87–5.11)
RBC: 2.9 MIL/uL — ABNORMAL LOW (ref 3.87–5.11)
RDW: 14.4 % (ref 11.5–15.5)
RDW: 14.6 % (ref 11.5–15.5)
WBC: 14 10*3/uL — ABNORMAL HIGH (ref 4.0–10.5)
WBC: 14.3 10*3/uL — ABNORMAL HIGH (ref 4.0–10.5)
nRBC: 0 % (ref 0.0–0.2)
nRBC: 0 % (ref 0.0–0.2)

## 2021-02-20 LAB — TYPE AND SCREEN
ABO/RH(D): A NEG
Antibody Screen: POSITIVE
Unit division: 0

## 2021-02-20 LAB — BPAM RBC
Blood Product Expiration Date: 202206042359
ISSUE DATE / TIME: 202205121933
Unit Type and Rh: 600

## 2021-02-20 LAB — RPR: RPR Ser Ql: NONREACTIVE

## 2021-02-20 LAB — BPAM FFP
Blood Product Expiration Date: 202205172359
ISSUE DATE / TIME: 202205121933
Unit Type and Rh: 6200

## 2021-02-20 MED ORDER — LACTATED RINGERS IV SOLN: INTRAVENOUS | Status: DC

## 2021-02-20 MED ORDER — SODIUM CHLORIDE 0.9 % IV SOLN
500.0000 mg | Freq: Once | INTRAVENOUS | Status: AC
  Administered 2021-02-20: 500 mg via INTRAVENOUS
  Filled 2021-02-20: qty 25

## 2021-02-20 MED ORDER — RHO D IMMUNE GLOBULIN 1500 UNIT/2ML IJ SOSY
300.0000 ug | PREFILLED_SYRINGE | Freq: Once | INTRAMUSCULAR | Status: AC
  Administered 2021-02-20: 300 ug via INTRAVENOUS
  Filled 2021-02-20: qty 2

## 2021-02-20 NOTE — Progress Notes (Signed)
OB Note I went by to check up on mom and she is doing well and baby is now in the room. I explained to her and dad re: the UA laceration and follow up blood count but one from last night looked good. I also told her I did a Parkland on the left tube and removed a sizeable portion of it and inability to do a full salpingectomy due to vascularity at the tube.

## 2021-02-20 NOTE — Progress Notes (Addendum)
Subjective: Postpartum Day 1: s/p Cesarean Delivery Patient reports tolerating PO. She still has catheter in place and has not had flatus or BM. Marland Kitchen    Objective: Vital signs in last 24 hours: Temp:  [97 F (36.1 C)-98.4 F (36.9 C)] 98 F (36.7 C) (05/13 0900) Pulse Rate:  [67-106] 90 (05/13 0900) Resp:  [14-21] 17 (05/13 0900) BP: (100-152)/(66-94) 121/77 (05/13 0900) SpO2:  [98 %-100 %] 99 % (05/13 0900) Weight:  [92.3 kg] 92.3 kg (05/12 1633)  Physical Exam:  General: alert, cooperative and no distress Lochia: appropriate Uterine Fundus: firm Incision: clear and dry bandage in place DVT Evaluation: No evidence of DVT seen on physical exam.  Recent Labs    02/19/21 2351 02/20/21 0847  HGB 8.3* 7.8*  HCT 24.6* 23.2*    Assessment/Plan: Status post Cesarean section. Doing well postoperatively.  Continue current care. -plan for ambulation today, d/c foley when appropriate -patient desires BTL -Hgb 7.8 so will do venofer infusion  Charlesetta Garibaldi Zosia Lucchese 02/20/2021, 11:09 AM

## 2021-02-20 NOTE — Addendum Note (Signed)
Addendum  created 02/20/21 1124 by Leilani Able, MD   Child order released for a procedure order, Clinical Note Signed, Intraprocedure Blocks edited

## 2021-02-20 NOTE — Progress Notes (Signed)
Foley catheter removed

## 2021-02-20 NOTE — Anesthesia Procedure Notes (Signed)
Spinal  Patient location during procedure: OR Start time: 02/19/2021 5:49 PM End time: 02/19/2021 5:52 PM Reason for block: surgical anesthesia Staffing Performed: anesthesiologist  Anesthesiologist: Leilani Able, MD Preanesthetic Checklist Completed: patient identified, IV checked, site marked, risks and benefits discussed, surgical consent, monitors and equipment checked, pre-op evaluation and timeout performed Spinal Block Patient position: sitting Prep: DuraPrep and site prepped and draped Patient monitoring: continuous pulse ox and blood pressure Approach: midline Location: L3-4 Injection technique: single-shot Needle Needle type: Pencan  Needle gauge: 24 G Needle length: 10 cm Needle insertion depth: 5 cm Assessment Sensory level: T4 Events: CSF return

## 2021-02-21 LAB — RH IG WORKUP (INCLUDES ABO/RH)
Fetal Screen: NEGATIVE
Gestational Age(Wks): 37
Unit division: 0

## 2021-02-21 LAB — CBC
HCT: 23.7 % — ABNORMAL LOW (ref 36.0–46.0)
Hemoglobin: 7.9 g/dL — ABNORMAL LOW (ref 12.0–15.0)
MCH: 28.8 pg (ref 26.0–34.0)
MCHC: 33.3 g/dL (ref 30.0–36.0)
MCV: 86.5 fL (ref 80.0–100.0)
Platelets: 237 10*3/uL (ref 150–400)
RBC: 2.74 MIL/uL — ABNORMAL LOW (ref 3.87–5.11)
RDW: 15 % (ref 11.5–15.5)
WBC: 11.8 10*3/uL — ABNORMAL HIGH (ref 4.0–10.5)
nRBC: 0 % (ref 0.0–0.2)

## 2021-02-21 MED ORDER — SODIUM CHLORIDE 0.9 % IV SOLN: 500.0000 mg | Freq: Once | INTRAVENOUS | Status: DC

## 2021-02-21 NOTE — Progress Notes (Addendum)
Post Partum Day 2 Subjective: Kathy Aguilar  is a 35 y.o. [id number] s/p pC/S at [redacted]w[redacted]d.  She reports she is doing well. No acute events overnight. She denies any problems with ambulating, voiding or po intake. Denies nausea or vomiting.  Pain is well controlled on ibuprofen.  Lochia is moderate and improving.  Objective: Blood pressure 125/75, pulse 90, temperature 98.4 F (36.9 C), temperature source Axillary, resp. rate 18, height 5\' 6"  (1.676 m), weight 92.3 kg, last menstrual period 06/04/2020, SpO2 99 %, currently breastfeeding.  Physical Exam:  General: alert, cooperative, fatigued and no distress Lochia: appropriate Uterine Fundus: firm Incision: Pressure dressing in place DVT Evaluation: No evidence of DVT seen on physical exam. Negative Homan's sign. No cords or calf tenderness. No significant calf/ankle edema.  Recent Labs    02/20/21 0847 02/21/21 0502  HGB 7.8* 7.9*  HCT 23.2* 23.7*    Assessment/Plan: Plan for discharge tomorrow, Discharge home, Breastfeeding and Contraception BTL (done 02/19/21)  ABL Anemia -- s/p Venofer infusion - plan outpatient weekly infusions x 3    LOS: 2 days   04/21/21, CNM 02/21/2021, 12:40 PM

## 2021-02-22 DIAGNOSIS — T8189XA Other complications of procedures, not elsewhere classified, initial encounter: Secondary | ICD-10-CM

## 2021-02-22 MED ORDER — ACETAMINOPHEN 500 MG PO TABS: 1000.0000 mg | ORAL_TABLET | Freq: Four times a day (QID) | ORAL | 0 refills | Status: DC | PRN

## 2021-02-22 MED ORDER — IBUPROFEN 600 MG PO TABS: 600.0000 mg | ORAL_TABLET | Freq: Four times a day (QID) | ORAL | 0 refills | Status: DC

## 2021-02-22 MED ORDER — OXYCODONE HCL 5 MG PO TABS: 5.0000 mg | ORAL_TABLET | Freq: Four times a day (QID) | ORAL | 0 refills | Status: DC | PRN

## 2021-02-22 MED ORDER — DOCUSATE SODIUM 100 MG PO CAPS: 100.0000 mg | ORAL_CAPSULE | Freq: Every day | ORAL | 2 refills | Status: DC | PRN

## 2021-02-22 NOTE — Lactation Note (Signed)
This note was copied from a baby's chart. Lactation Consultation Note  Patient Name: Kathy Aguilar [id number] Date: 02/22/2021 Reason for consult: Follow-up assessment Age:36 Hours  Mother is a P3 infant is 37+1  And is at 8% wt loss. Mother has been supplementing with ebm and DBM.Marland Kitchen  Mother reports that infant is feeding well.  Reviewed hand expression with mother. Observed large drops of colostrum. Mother was observed with infant latched on at the left breast. Observed infant suckling with audible swallows. Infant sustained latch for 15 mins.  Observed that infant has a posterior tongue tie. Advised parents to have evaluated by a Dental Specialist. Handout given.. Father reports that he was adopted and his medical history reports that he had his tongue clipped at birth.   Discussed treatment and prevention of engorgement.   Plan of Care : Breastfeed infant with feeding cues Supplement infant with ebm/formula, according to supplemental guidelines. Mother is pumping 15 ml after each feeding.  Pump using a DEBP after each feeding for 15-20 mins.   Mother to continue to cue base feed infant and feed at least 8-12 times or more in 24 hours and advised to allow for cluster feeding infant as needed.  Mother to continue to due STS. Mother is aware of available LC services at [facility], BFSG'S, OP Dept, and phone # for questions or concerns about breastfeeding.  Mother receptive to all teaching and plan of care.   Maternal Data Has patient been taught Hand Expression?: Yes  Feeding Mother's Current Feeding Choice: Breast Milk and Donor Milk  LATCH Score Latch: Grasps breast easily, tongue down, lips flanged, rhythmical sucking.  Audible Swallowing: A few with stimulation  Type of Nipple: Everted at rest and after stimulation  Comfort (Breast/Nipple): Filling, red/small blisters or bruises, mild/mod discomfort  Hold (Positioning): Assistance needed to correctly position infant at breast  and maintain latch.  LATCH Score: 7   Lactation Tools Discussed/Used    Interventions Interventions: Adjust position;Breast compression;Hand express;Skin to skin;Assisted with latch;Breast feeding basics reviewed;Support pillows;Position options  Discharge Discharge Education: Engorgement and breast care;Warning signs for feeding baby;Outpatient recommendation;Outpatient Epic message sent  Consult Status Consult Status: Complete    Michel Bickers 02/22/2021, 12:08 PM

## 2021-02-22 NOTE — Lactation Note (Addendum)
This note was copied from a baby's chart. Lactation Consultation Note  Patient Name: Kathy Aguilar [id number] Date: 02/22/2021   Age:36 hours P3, ETI female infant with -8% weight loss. RN put [facility] consult in tonight. LC entered the room, mom and infant asleep. LC with try make 2nd attempt before shift ends at 0330 am.  Maternal Data    Feeding    LATCH Score                    Lactation Tools Discussed/Used    Interventions    Discharge    Consult Status      Danelle Earthly 02/22/2021, 1:46 AM

## 2021-02-22 NOTE — Discharge Summary (Signed)
Postpartum Discharge Summary  Date of Service updated 02/22/21     Patient Name: Kathy Aguilar DOB: 04/27/1986 MRN: [id number]  Date of admission: 02/19/2021 Delivery date:02/19/2021  Delivering provider: Aletha Halim  Date of discharge: 02/22/2021  Admitting diagnosis: Preeclampsia, severe, third trimester [O14.13] Intrauterine pregnancy: [redacted]w[redacted]d    Secondary diagnosis:  Active Problems:   Preeclampsia, severe, third trimester  Additional problems: intraop hemorrhage     Discharge diagnosis: Term Pregnancy Delivered                                              Post partum procedures:blood transfusion and rhogam Augmentation: N/A Complications: [id number] Hospital course: Sceduled C/S   35y.o. yo [id number] 35y.o. as admitted to the hospital 02/19/2021 for scheduled cesarean section with the following indication:Malpresentation.Delivery details are as follows:  Membrane Rupture Time/Date: 6:20 PM ,02/19/2021   Delivery Method:C-Section, Low Transverse  Details of operation can be found in separate operative note.  Patient had an uncomplicated postpartum course.  She is ambulating, tolerating a regular diet, passing flatus, and urinating well. Patient is discharged home in stable condition on  02/22/21        Newborn Data: Birth date:02/19/2021  Birth time:6:21 PM  Gender:Female  Living status:Living  Apgars:7 ,8  Weight:3190 g     Magnesium Sulfate received: No BMZ received: No Rhophylac:Yes MMR:No T-DaP:Given prenatally Flu: Yes Transfusion:Yes  Physical exam  Vitals:   02/21/21 0604 02/21/21 1558 02/21/21 2217 02/22/21 0542  BP: 120/81 125/75 131/84 124/87  Pulse: 82 90 88 85  Resp: _0 Temp: 97.9 F (36.6 C) 98.4 F (36.9 C) 97.8 F (36.6 C) 97.9 F (36.6 C)  TempSrc: Oral Axillary Oral Oral  SpO2: 99%     Weight:      Height:       General: alert Lochia: appropriate Uterine Fundus: firm Incision: Healing well with no significant  drainage DVT Evaluation: No evidence of DVT seen on physical exam. Labs: Lab Results  Component Value Date   WBC 11.8 (H) 02/21/2021   HGB 7.9 (L) 02/21/2021   HCT 23.7 (L) 02/21/2021   MCV 86.5 02/21/2021   PLT 237 02/21/2021   CMP Latest Ref Rng & Units 02/19/2021  Glucose 70 - 99 mg/dL 98  BUN 6 - 20 mg/dL <5(L)  Creatinine 0.44 - 1.00 mg/dL 0.50  Sodium 135 - 145 mmol/L 137  Potassium 3.5 - 5.1 mmol/L 3.1(L)  Chloride 98 - 111 mmol/L 107  CO2 22 - 32 mmol/L 21(L)  Calcium 8.9 - 10.3 mg/dL 8.5(L)  Total Protein 6.5 - 8.1 g/dL 6.2(L)  Total Bilirubin 0.3 - 1.2 mg/dL 0.6  Alkaline Phos 38 - 126 U/L 123  AST 15 - 41 U/L 18  ALT 0 - 44 U/L 9   Edinburgh Score: Edinburgh Postnatal Depression Scale Screening Tool 02/21/2021  I have been able to laugh and see the funny side of things. (No Data)  I have looked forward with enjoyment to things. -  I have blamed myself unnecessarily when things went wrong. -  I have been anxious or worried for no good reason. -  I have felt scared or panicky for no good reason. -  Things have been getting on top of me. -  I have been so unhappy that I have had difficulty sleeping. -  I have felt sad or miserable. -  I have been so unhappy that I have been crying. -  The thought of harming myself has occurred to me. Flavia Shipper Postnatal Depression Scale Total -     After visit meds:  Allergies as of 02/22/2021      Reactions   Sumatriptan Rash      Medication List    TAKE these medications   acetaminophen 500 MG tablet Commonly known as: TYLENOL Take 2 tablets (1,000 mg total) by mouth every 6 (six) hours as needed.   cyclobenzaprine 10 MG tablet Commonly known as: FLEXERIL Take 1 tablet (10 mg total) by mouth every 8 (eight) hours as needed for muscle spasms.   docusate sodium 100 MG capsule Commonly known as: Colace Take 1 capsule (100 mg total) by mouth daily as needed.   ferrous sulfate 325 (65 FE) MG tablet Commonly known  as: FerrouSul Take 1 tablet (325 mg total) by mouth every other day.   ibuprofen 600 MG tablet Commonly known as: ADVIL Take 1 tablet (600 mg total) by mouth every 6 (six) hours.   oxyCODONE 5 MG immediate release tablet Commonly known as: Oxy IR/ROXICODONE Take 1-2 tablets (5-10 mg total) by mouth every 6 (six) hours as needed for moderate pain.   PRENATAL 1 PO Take 1 tablet by mouth daily.        Discharge home in stable condition Infant Feeding: Breast Infant Disposition:home with mother Discharge instruction: per After Visit Summary and Postpartum booklet. Activity: Advance as tolerated. Pelvic rest for 6 weeks.  Diet: routine diet Future Appointments: Future Appointments  Date Time Provider [place]  02/26/2021  1:30 PM [facility] NURSE [facility]  03/25/2021  1:45 PM Caren Macadam, MD [facility]   Follow up Visit:  Message sent to [place] on 02/22/21: Please schedule this patient for a In person postpartum visit in 4 weeks with the following provider: MD. Additional Postpartum F/U:Incision check 1 week  High risk pregnancy complicated by: HTN and PPH Delivery mode:  C-Section, Low Transverse  Anticipated Birth Control:  BTL done [facility]   02/22/2021 Fatima Blank, CNM

## 2021-02-23 ENCOUNTER — Other Ambulatory Visit: Payer: Self-pay | Admitting: Obstetrics and Gynecology

## 2021-02-23 DIAGNOSIS — O99019 Anemia complicating pregnancy, unspecified trimester: Secondary | ICD-10-CM

## 2021-02-23 LAB — SURGICAL PATHOLOGY

## 2021-02-23 MED ORDER — SODIUM CHLORIDE 0.9 % IV SOLN: 300.0000 mg | INTRAVENOUS | 0 refills | Status: DC

## 2021-02-23 NOTE — Progress Notes (Signed)
Weekly Venofer infusion x 3 doses orders placed.

## 2021-02-25 ENCOUNTER — Encounter: Payer: BC Managed Care – PPO | Admitting: Family Medicine

## 2021-02-26 ENCOUNTER — Other Ambulatory Visit: Payer: Self-pay

## 2021-02-26 ENCOUNTER — Ambulatory Visit (INDEPENDENT_AMBULATORY_CARE_PROVIDER_SITE_OTHER): Payer: BC Managed Care – PPO | Admitting: *Deleted

## 2021-02-26 VITALS — BP 150/91 | HR 76

## 2021-02-26 DIAGNOSIS — Z98891 History of uterine scar from previous surgery: Secondary | ICD-10-CM

## 2021-02-26 MED ORDER — NIFEDIPINE ER OSMOTIC RELEASE 30 MG PO TB24: 30.0000 mg | ORAL_TABLET | Freq: Every day | ORAL | 2 refills | Status: DC

## 2021-02-26 NOTE — Progress Notes (Signed)
Subjective:  Kathy Aguilar is a 35 y.o. female here for incision check.   Hypertension ROS: no chest pain on exertion, no dyspnea on exertion and noting swelling of ankles.    Objective:  BP (!) 150/91   Pulse 76   Appearance alert, well appearing, and in no distress. Iincision healing well, steri strip still intact. No drainage or redness noted.  BP noted to be not be well controlled today in office.    Assessment:   Blood Pressure poorly controlled.   Plan:  Discussed with Dr Macon Large about BP today in office, will send in medication and pt to return in one week for BP check.Marland Kitchen

## 2021-02-26 NOTE — Progress Notes (Signed)
Patient was assessed and managed by nursing staff during this encounter. I have reviewed the chart and agree with the documentation and plan.   Jaynie Collins, MD 02/26/2021 2:17 PM

## 2021-03-04 ENCOUNTER — Encounter: Payer: BC Managed Care – PPO | Admitting: Advanced Practice Midwife

## 2021-03-05 ENCOUNTER — Other Ambulatory Visit: Payer: Self-pay

## 2021-03-05 ENCOUNTER — Ambulatory Visit (INDEPENDENT_AMBULATORY_CARE_PROVIDER_SITE_OTHER): Payer: BC Managed Care – PPO | Admitting: *Deleted

## 2021-03-05 DIAGNOSIS — O1413 Severe pre-eclampsia, third trimester: Secondary | ICD-10-CM

## 2021-03-05 NOTE — Progress Notes (Signed)
Patient seen and assessed by nursing staff.  Agree with documentation and plan.  

## 2021-03-05 NOTE — Progress Notes (Signed)
Subjective:  Kathy Aguilar is a 35 y.o. female here for BP check.   Hypertension ROS: taking medications as instructed, no medication side effects noted, no TIA's, no chest pain on exertion, no dyspnea on exertion and no swelling of ankles.    Objective:  BP 130/72   Appearance alert, well appearing, and in no distress. General exam BP noted to be well controlled today in office.    Assessment:   Blood Pressure stable.   Plan:  Follow up as needed and or postpartum visit. Marland Kitchen

## 2021-03-12 NOTE — Addendum Note (Signed)
Addended by: Sharen Counter A on: 03/12/2021 01:40 PM   Modules accepted: Orders, SmartSet

## 2021-03-12 NOTE — Discharge Instructions (Signed)

## 2021-03-13 ENCOUNTER — Other Ambulatory Visit: Payer: Self-pay

## 2021-03-13 ENCOUNTER — Encounter (HOSPITAL_COMMUNITY)
Admission: RE | Admit: 2021-03-13 | Discharge: 2021-03-13 | Disposition: A | Discharge status: Home / Self Care | Payer: BC Managed Care – PPO | Source: Ambulatory Visit | Attending: Obstetrics and Gynecology | Admitting: Obstetrics and Gynecology

## 2021-03-13 DIAGNOSIS — O9081 Anemia of the puerperium: Secondary | ICD-10-CM | POA: Insufficient documentation

## 2021-03-13 MED ORDER — SODIUM CHLORIDE 0.9 % IV SOLN
300.0000 mg | INTRAVENOUS | Status: DC
  Administered 2021-03-13: 300 mg via INTRAVENOUS
  Filled 2021-03-13: qty 15

## 2021-03-13 MED ORDER — ALBUTEROL SULFATE (2.5 MG/3ML) 0.083% IN NEBU: 2.5000 mg | INHALATION_SOLUTION | Freq: Once | RESPIRATORY_TRACT | Status: DC | PRN

## 2021-03-13 MED ORDER — SODIUM CHLORIDE 0.9 % IV BOLUS: 500.0000 mL | Freq: Once | INTRAVENOUS | Status: DC | PRN

## 2021-03-13 MED ORDER — DIPHENHYDRAMINE HCL 50 MG/ML IJ SOLN: 25.0000 mg | Freq: Once | INTRAMUSCULAR | Status: DC | PRN

## 2021-03-13 MED ORDER — SODIUM CHLORIDE 0.9 % IV SOLN: INTRAVENOUS | Status: DC | PRN

## 2021-03-13 MED ORDER — METHYLPREDNISOLONE SODIUM SUCC 125 MG IJ SOLR: 125.0000 mg | Freq: Once | INTRAMUSCULAR | Status: DC | PRN

## 2021-03-13 MED ORDER — EPINEPHRINE PF 1 MG/ML IJ SOLN: 0.3000 mg | Freq: Once | INTRAMUSCULAR | Status: DC | PRN

## 2021-03-20 ENCOUNTER — Ambulatory Visit (HOSPITAL_COMMUNITY): Payer: BC Managed Care – PPO

## 2021-03-23 ENCOUNTER — Other Ambulatory Visit: Payer: Self-pay

## 2021-03-23 ENCOUNTER — Encounter (HOSPITAL_COMMUNITY)
Admission: RE | Admit: 2021-03-23 | Discharge: 2021-03-23 | Disposition: A | Discharge status: Home / Self Care | Payer: BC Managed Care – PPO | Source: Ambulatory Visit | Attending: Obstetrics and Gynecology | Admitting: Obstetrics and Gynecology

## 2021-03-23 DIAGNOSIS — O9081 Anemia of the puerperium: Secondary | ICD-10-CM | POA: Diagnosis not present

## 2021-03-23 MED ORDER — SODIUM CHLORIDE 0.9 % IV SOLN
300.0000 mg | INTRAVENOUS | Status: DC
  Administered 2021-03-23: 300 mg via INTRAVENOUS
  Filled 2021-03-23: qty 15

## 2021-03-25 ENCOUNTER — Ambulatory Visit (INDEPENDENT_AMBULATORY_CARE_PROVIDER_SITE_OTHER): Payer: BC Managed Care – PPO | Admitting: Family Medicine

## 2021-03-25 ENCOUNTER — Encounter: Payer: Self-pay | Admitting: Family Medicine

## 2021-03-25 ENCOUNTER — Other Ambulatory Visit: Payer: Self-pay

## 2021-03-25 DIAGNOSIS — O1415 Severe pre-eclampsia, complicating the puerperium: Secondary | ICD-10-CM

## 2021-03-25 DIAGNOSIS — O99019 Anemia complicating pregnancy, unspecified trimester: Secondary | ICD-10-CM | POA: Diagnosis not present

## 2021-03-25 DIAGNOSIS — D649 Anemia, unspecified: Secondary | ICD-10-CM | POA: Insufficient documentation

## 2021-03-25 DIAGNOSIS — O1413 Severe pre-eclampsia, third trimester: Secondary | ICD-10-CM

## 2021-03-25 DIAGNOSIS — D62 Acute posthemorrhagic anemia: Secondary | ICD-10-CM | POA: Diagnosis not present

## 2021-03-25 LAB — CBC
Hematocrit: 37.3 % (ref 34.0–46.6)
Hemoglobin: 12.1 g/dL (ref 11.1–15.9)
MCH: 27.7 pg (ref 26.6–33.0)
MCHC: 32.4 g/dL (ref 31.5–35.7)
MCV: 85 fL (ref 79–97)
Platelets: 285 10*3/uL (ref 150–450)
RBC: 4.37 x10E6/uL (ref 3.77–5.28)
RDW: 14.2 % (ref 11.7–15.4)
WBC: 8.3 10*3/uL (ref 3.4–10.8)

## 2021-03-25 NOTE — Progress Notes (Signed)
Post Partum Visit Note  Kathy Aguilar is a 35 y.o. [phone] female who presents for a postpartum visit. She is 5 weeks postpartum following a primary cesarean section.  I have fully reviewed the prenatal and intrapartum course. The delivery was at [redacted]w[redacted]d gestational weeks for breech with intraoperative hemorrhage 2L  Anesthesia: spinal. Postpartum course has been uncomplicated. Baby is doing well. Baby is feeding by bottle - Similac Advance. Bleeding thin lochia. Bowel function is normal. Bladder function is normal. Patient is not sexually active. Contraception method is tubal ligation. Postpartum depression screening: negative.   The pregnancy intention screening data noted above was reviewed. Potential methods of contraception were discussed. The patient elected to proceed with Female Sterilization.    Edinburgh Postnatal Depression Scale - 03/25/21 1342       Edinburgh Postnatal Depression Scale:  In the Past 7 Days   I have been able to laugh and see the funny side of things. 0    I have looked forward with enjoyment to things. 0    I have blamed myself unnecessarily when things went wrong. 0    I have been anxious or worried for no good reason. 0    I have felt scared or panicky for no good reason. 0    Things have been getting on top of me. 0    I have been so unhappy that I have had difficulty sleeping. 0    I have felt sad or miserable. 0    I have been so unhappy that I have been crying. 0    The thought of harming myself has occurred to me. 0    Edinburgh Postnatal Depression Scale Total 0             Health Maintenance Due  Topic Date Due   COVID-19 Vaccine (2 - Booster for Janssen series) 02/16/2020    The following portions of the patient's history were reviewed and updated as appropriate: allergies, current medications, past family history, past medical history, past social history, past surgical history, and problem list.  Review of Systems Pertinent items  are noted in HPI.  Objective:  BP 139/87   Pulse 82   Wt 178 lb 3.2 oz (80.8 kg)   Breastfeeding No   BMI 28.76 kg/m    General:  alert, cooperative, and appears stated age   Breasts:  not indicated  Lungs: clear to auscultation bilaterally  Heart:  regular rate and rhythm, S1, S2 normal, no murmur, click, rub or gallop  Abdomen: soft, non-tender; bowel sounds normal; no masses,  no organomegaly   Wound well approximated incision  GU exam:  not indicated       Assessment:    There are no diagnoses linked to this encounter.  normal postpartum exam.   Plan:   Essential components of care per ACOG recommendations:  1.  Mood and well being: Patient with negative depression screening today. Reviewed local resources for support.  - Patient tobacco use? No.   - hx of drug use? No.    2. Infant care and feeding:  -Patient currently breastmilk feeding? No.  -Social determinants of health (SDOH) reviewed in EPIC. No concerns  3. Sexuality, contraception and birth spacing - Patient does not want a pregnancy in the next year.  Desired family size is 3 children.  - Reviewed forms of contraception in tiered fashion. Patient had salpingectomy   4. Sleep and fatigue -Encouraged family/partner/community support of 4 hrs of uninterrupted  sleep to help with mood and fatigue  5. Physical Recovery  - Discussed patients delivery and complications. She describes her labor as mixed. - Patient had a C-section- complicated by right uterine laceration and hemorrhage with 2L  Perineal healing reviewed. Patient expressed understanding - Patient has urinary incontinence? No. - Patient is safe to resume physical and sexual activity  6.  Health Maintenance - HM due items addressed Yes - Last pap smear  Diagnosis  Date Value Ref Range Status  12/31/2019   Final   - Negative for intraepithelial lesion or malignancy (NILM)   Pap smear not done at today's visit.  -Breast Cancer screening  indicated? No.   7. Chronic Disease/Pregnancy Condition follow up: Anemia and Preeclampsia  1. Anemia affecting pregnancy, antepartum Lost 2 L in surgery Received blood, PLT and iron infusions - CBC  2. Acute blood loss anemia - CBC  3. Severe pre-eclampsia in third trimester BP is at baseline today Stop medication Return in 1 week for BP check, OK to be virtual  - PCP follow up  Return in about 1 week (around 04/01/2021) for BP check. Future Appointments  Date Time Provider Department Center  03/30/2021  9:00 AM [id number] None  04/01/2021 10:00 AM [facility] NURSE [facility]    Federico Flake, MD Center for [facility], [facility]

## 2021-03-30 ENCOUNTER — Inpatient Hospital Stay (HOSPITAL_COMMUNITY): Admission: RE | Admit: 2021-03-30 | Payer: BC Managed Care – PPO | Source: Ambulatory Visit

## 2021-04-02 ENCOUNTER — Other Ambulatory Visit: Payer: Self-pay

## 2021-04-02 ENCOUNTER — Ambulatory Visit (INDEPENDENT_AMBULATORY_CARE_PROVIDER_SITE_OTHER): Payer: BC Managed Care – PPO | Admitting: *Deleted

## 2021-04-02 VITALS — BP 130/87 | HR 96

## 2021-04-02 DIAGNOSIS — O1413 Severe pre-eclampsia, third trimester: Secondary | ICD-10-CM

## 2021-04-02 NOTE — Progress Notes (Signed)
Subjective:  Kathy Aguilar is a 35 y.o. female here for BP check.   Hypertension ROS: no chest pain on exertion, no dyspnea on exertion, and no swelling of ankles.    Objective:  BP 130/87   Pulse 96   Appearance alert, well appearing, and in no distress. General exam BP noted to be stable today in office.    Assessment:   Blood Pressure stable.   Plan:  Follow up as needed .

## 2021-04-03 ENCOUNTER — Ambulatory Visit: Payer: BC Managed Care – PPO

## 2021-04-06 NOTE — Progress Notes (Signed)
Attestation of Attending Supervision of clinical support staff: I agree with the care provided to this patient and was available for any consultation.  I have reviewed the RN's note and chart. I was available for consult and to see the patient if needed.   Virgene Tirone Niles Kynlee Koenigsberg, MD, MPH, ABFM Attending Physician Faculty Practice- [facility]  

## 2021-04-21 ENCOUNTER — Other Ambulatory Visit: Payer: Self-pay

## 2021-05-20 ENCOUNTER — Other Ambulatory Visit: Payer: Self-pay | Admitting: Obstetrics & Gynecology

## 2021-08-19 ENCOUNTER — Encounter: Payer: Self-pay | Admitting: Emergency Medicine

## 2021-08-19 ENCOUNTER — Other Ambulatory Visit: Payer: Self-pay

## 2021-08-19 ENCOUNTER — Ambulatory Visit
Admission: EM | Admit: 2021-08-19 | Discharge: 2021-08-19 | Disposition: A | Discharge status: Home / Self Care | Payer: BC Managed Care – PPO | Attending: Emergency Medicine | Admitting: Emergency Medicine

## 2021-08-19 DIAGNOSIS — J101 Influenza due to other identified influenza virus with other respiratory manifestations: Secondary | ICD-10-CM

## 2021-08-19 LAB — POCT INFLUENZA A/B
Influenza A, POC: POSITIVE — AB
Influenza B, POC: NEGATIVE

## 2021-08-19 MED ORDER — OSELTAMIVIR PHOSPHATE 75 MG PO CAPS: 75.0000 mg | ORAL_CAPSULE | Freq: Two times a day (BID) | ORAL | 0 refills | Status: DC

## 2021-08-19 NOTE — ED Provider Notes (Signed)
[name]    CSN: [id number] Arrival date & time: 08/19/21  8099      History   Chief Complaint Chief Complaint  Patient presents with   Fever   Otalgia   Generalized Body Aches   Cough    HPI Kathy Aguilar is a 35 y.o. female.  Patient presents with 1 day history of fever, body aches, earache, cough.  Treatment with OTC cold medication.  She denies wheezing, shortness of breath, vomiting, diarrhea, or other symptoms.  Her medical history includes asthma and migraine headaches.  The history is provided by the patient and medical records.   Past Medical History:  Diagnosis Date   Asthma    Juvenille   Family history of adverse reaction to anesthesia    mother has vomiting after anesthesia   Family history of breast cancer 03/18/2017   Mom at age 30. States mom had negative genetic testing.    Migraine without aura     Patient Active Problem List   Diagnosis Date Noted   Anemia 03/25/2021   H/O unilateral salpingectomy- Right removed, Left [place]  12/19/2020   Headache disorder 02/16/2020    Past Surgical History:  Procedure Laterality Date   BILATERAL SALPINGECTOMY Right 08/17/2016   Procedure: RIGHT SALPINGECTOMY;  Surgeon: Jerene Bears, MD;  [facility];  Service: Gynecology;  Laterality: Right;   CESAREAN SECTION WITH BILATERAL TUBAL LIGATION Bilateral 02/19/2021   Procedure: CESAREAN SECTION WITH BILATERAL TUBAL LIGATION;  Surgeon: Troy Bing, MD;  [facility];  Service: Obstetrics;  Laterality: Bilateral;   CHROMOPERTUBATION Bilateral 08/17/2016   Procedure: CHROMOPERTUBATION;  Surgeon: Jerene Bears, MD;  Location: [facility];  Service: Gynecology;  Laterality: Bilateral;   LAPAROSCOPIC APPENDECTOMY N/A 01/14/2016   Procedure: APPENDECTOMY LAPAROSCOPIC;  Surgeon: Lattie Haw, MD;  Location: [facility] ORS;  Service: General;  Laterality: N/A;   LAPAROSCOPIC LYSIS OF ADHESIONS  08/17/2016   Procedure: LAPAROSCOPIC LYSIS OF ADHESIONS;   Surgeon: Jerene Bears, MD;  [facility];  Service: Gynecology;;   LAPAROSCOPY N/A 08/17/2016   Procedure: LAPAROSCOPY DIAGNOSTIC with left paratubal cystectomy;  Surgeon: Jerene Bears, MD;  Location: [facility];  Service: Gynecology;  Laterality: N/A;   SALPINGECTOMY Right    SURGICAL ENTRY ABOVE IS INCORRECT    OB History     Gravida  4   Para  3   Term  3   Preterm  0   AB  1   Living  3      SAB  1   IAB  0   Ectopic  0   Multiple  0   Live Births  3            Home Medications    Prior to Admission medications   Medication Sig Start Date End Date Taking? Authorizing Provider  oseltamivir (TAMIFLU) 75 MG capsule Take 1 capsule (75 mg total) by mouth every 12 (twelve) hours. 08/19/21  Yes Mickie Bail, NP  acetaminophen (TYLENOL) 500 MG tablet Take 2 tablets (1,000 mg total) by mouth every 6 (six) hours as needed. Patient not taking: Reported on 03/25/2021 02/22/21 02/22/22  Katrinka Blazing, [place], CNM  cyclobenzaprine (FLEXERIL) 10 MG tablet Take 1 tablet (10 mg total) by mouth every 8 (eight) hours as needed for muscle spasms. Patient not taking: No sig reported 08/13/20   Federico Flake, MD  docusate sodium (COLACE) 100 MG capsule Take 1 capsule (100 mg total) by mouth daily  as needed. Patient not taking: Reported on 03/25/2021 02/22/21 02/22/22  Katrinka Blazing, [place], CNM  ferrous sulfate (FERROUSUL) 325 (65 FE) MG tablet Take 1 tablet (325 mg total) by mouth every other day. Patient not taking: Reported on 03/25/2021 01/29/21   Federico Flake, MD  ibuprofen (ADVIL) 600 MG tablet Take 1 tablet (600 mg total) by mouth every 6 (six) hours. Patient not taking: Reported on 03/25/2021 02/22/21   Katrinka Blazing, [place], CNM  NIFEdipine (PROCARDIA-XL/NIFEDICAL-XL) 30 MG 24 hr tablet Take 1 tablet (30 mg total) by mouth daily. Can increase to twice a day as needed for symptomatic contractions 02/26/21   Anyanwu, Jethro Bastos, MD  oxyCODONE (OXY IR/ROXICODONE) 5 MG immediate release  tablet Take 1-2 tablets (5-10 mg total) by mouth every 6 (six) hours as needed for moderate pain. Patient not taking: Reported on 03/25/2021 02/22/21   Dorathy Kinsman, CNM  Prenatal MV-Min-Fe Fum-FA-DHA (PRENATAL 1 PO) Take 1 tablet by mouth daily. Patient not taking: Reported on 03/25/2021    [provider]    Family History Family History  Problem Relation Age of Onset   Breast cancer Mother 9   Osteoarthritis Maternal Grandmother    Hypertension Maternal Grandmother    Cancer Maternal Grandmother        blood cancer?   Kidney disease Maternal Grandmother    Cancer Maternal Grandfather 42       prostate cancer   Kidney disease Paternal Grandmother     Social History Social History   Tobacco Use   Smoking status: Never   Smokeless tobacco: Never  Vaping Use   Vaping Use: Never used  Substance Use Topics   Alcohol use: No   Drug use: No     Allergies   Sumatriptan   Review of Systems Review of Systems  Constitutional:  Positive for fever. Negative for chills.  HENT:  Positive for ear pain. Negative for sore throat.   Respiratory:  Positive for cough. Negative for shortness of breath.   Cardiovascular:  Negative for chest pain and palpitations.  Gastrointestinal:  Negative for abdominal pain, diarrhea and vomiting.  Skin:  Negative for color change and rash.  All other systems reviewed and are negative.   Physical Exam Triage Vital Signs ED Triage Vitals  Enc Vitals Group     BP      Pulse      Resp      Temp      Temp src      SpO2      Weight      Height      Head Circumference      Peak Flow      Pain Score      Pain Loc      Pain Edu?      Excl. in GC?    No data found.  Updated Vital Signs BP 129/79 (BP Location: Left Arm)   Pulse (!) 110   Temp 99.5 F (37.5 C) (Oral)   LMP  (LMP Unknown)   SpO2 98%   Visual Acuity Right Eye Distance:   Left Eye Distance:   Bilateral Distance:    Right Eye Near:   Left Eye Near:     Bilateral Near:     Physical Exam Vitals and nursing note reviewed.  Constitutional:      General: She is not in acute distress.    Appearance: She is well-developed.  HENT:     Head: Normocephalic and atraumatic.  Right Ear: Tympanic membrane normal.     Left Ear: Tympanic membrane normal.     Nose: Nose normal.     Mouth/Throat:     Mouth: Mucous membranes are moist.     Pharynx: Oropharynx is clear.  Eyes:     Conjunctiva/sclera: Conjunctivae normal.  Cardiovascular:     Rate and Rhythm: Normal rate and regular rhythm.     Heart sounds: Normal heart sounds.  Pulmonary:     Effort: Pulmonary effort is normal. No respiratory distress.     Breath sounds: Normal breath sounds.  Abdominal:     Palpations: Abdomen is soft.     Tenderness: There is no abdominal tenderness.  Musculoskeletal:     Cervical back: Neck supple.  Skin:    General: Skin is warm and dry.  Neurological:     Mental Status: She is alert.  Psychiatric:        Mood and Affect: Mood normal.        Behavior: Behavior normal.     UC Treatments / Results  Labs (all labs ordered are listed, but only abnormal results are displayed) Labs Reviewed  POCT INFLUENZA A/B - Abnormal; Notable for the following components:      Result Value   Influenza A, POC Positive (*)    All other components within normal limits    EKG   Radiology No results found.  Procedures Procedures (including critical care time)  Medications Ordered in UC Medications - No data to display  Initial Impression / Assessment and Plan / UC Course  I have reviewed the triage vital signs and the nursing notes.  Pertinent labs & imaging results that were available during my care of the patient were reviewed by me and considered in my medical decision making (see chart for details).   Influenza A.  Rapid flu test positive for influenza A.  Treating with Tamiflu.  Discussed symptomatic treatment including Tylenol or ibuprofen as  needed.  Education provided on influenza.  Instructed patient to follow-up with PCP if her symptoms are not improving.  She agrees to plan of care.    Final Clinical Impressions(s) / UC Diagnoses   Final diagnoses:  Influenza A     Discharge Instructions      Take the Tamiflu as directed.  Take Tylenol or ibuprofen as needed for fever or discomfort.  Follow-up with your primary care provider if your symptoms are not improving.      ED Prescriptions     Medication Sig Dispense Auth. Provider   oseltamivir (TAMIFLU) 75 MG capsule Take 1 capsule (75 mg total) by mouth every 12 (twelve) hours. 10 capsule Mickie Bail, NP      PDMP not reviewed this encounter.   Mickie Bail, NP 08/19/21 [phone]

## 2021-08-19 NOTE — Discharge Instructions (Addendum)
Take the Tamiflu as directed.  Take Tylenol or ibuprofen as needed for fever or discomfort.  Follow-up with your primary care provider if your symptoms are not improving.  

## 2021-08-19 NOTE — ED Triage Notes (Signed)
Pt c/o cough, fever, bilateral ear pain, and bodyaches sxs started yesterday.

## 2021-10-31 ENCOUNTER — Encounter: Payer: Self-pay | Admitting: Emergency Medicine

## 2021-10-31 ENCOUNTER — Other Ambulatory Visit: Payer: Self-pay

## 2021-10-31 ENCOUNTER — Ambulatory Visit
Admission: EM | Admit: 2021-10-31 | Discharge: 2021-10-31 | Disposition: A | Discharge status: Home / Self Care | Payer: BC Managed Care – PPO | Attending: Family Medicine | Admitting: Family Medicine

## 2021-10-31 DIAGNOSIS — J020 Streptococcal pharyngitis: Secondary | ICD-10-CM | POA: Diagnosis not present

## 2021-10-31 LAB — POCT RAPID STREP A (OFFICE): Rapid Strep A Screen: POSITIVE — AB

## 2021-10-31 MED ORDER — AMOXICILLIN-POT CLAVULANATE 875-125 MG PO TABS: 1.0000 | ORAL_TABLET | Freq: Two times a day (BID) | ORAL | 0 refills | Status: DC

## 2021-10-31 MED ORDER — FLUCONAZOLE 150 MG PO TABS: 150.0000 mg | ORAL_TABLET | Freq: Once | ORAL | 0 refills | Status: AC

## 2021-10-31 NOTE — Discharge Instructions (Addendum)
Force fluids. Ibuprofen and or tylenol as needed for pain and or fever. Complete entire course of medications. Return as needed.

## 2021-10-31 NOTE — ED Provider Notes (Signed)
[name]    CSN: [id number] Arrival date & time: 10/31/21  9326      History   Chief Complaint Chief Complaint  Patient presents with   Sore Throat    HPI Kathy Aguilar is a 36 y.o. female.   HPI Patient presents for evaluation of sore throat and bilateral ear pain x today.  Afebrile. No other associated symptoms. She has a history of strep infection. She reports current symptoms feel similar to prior strep infections. She is afebrile and no known sick contacts. Denies any other associated symptoms  Past Medical History:  Diagnosis Date   Asthma    Juvenille   Family history of adverse reaction to anesthesia    mother has vomiting after anesthesia   Family history of breast cancer 03/18/2017   Mom at age 78. States mom had negative genetic testing.    Migraine without aura     Patient Active Problem List   Diagnosis Date Noted   Anemia 03/25/2021   H/O unilateral salpingectomy- Right removed, Left [place]  12/19/2020   Headache disorder 02/16/2020    Past Surgical History:  Procedure Laterality Date   BILATERAL SALPINGECTOMY Right 08/17/2016   Procedure: RIGHT SALPINGECTOMY;  Surgeon: Jerene Bears, MD;  [facility]: [facility];  Service: Gynecology;  Laterality: Right;   CESAREAN SECTION WITH BILATERAL TUBAL LIGATION Bilateral 02/19/2021   Procedure: CESAREAN SECTION WITH BILATERAL TUBAL LIGATION;  Surgeon: Waterford Bing, MD;  [facility];  Service: Obstetrics;  Laterality: Bilateral;   CHROMOPERTUBATION Bilateral 08/17/2016   Procedure: CHROMOPERTUBATION;  Surgeon: Jerene Bears, MD;  Location: [facility];  Service: Gynecology;  Laterality: Bilateral;   LAPAROSCOPIC APPENDECTOMY N/A 01/14/2016   Procedure: APPENDECTOMY LAPAROSCOPIC;  Surgeon: Lattie Haw, MD;  Location: [facility] ORS;  Service: General;  Laterality: N/A;   LAPAROSCOPIC LYSIS OF ADHESIONS  08/17/2016   Procedure: LAPAROSCOPIC LYSIS OF ADHESIONS;  Surgeon: Jerene Bears, MD;  [facility];  Service: Gynecology;;   LAPAROSCOPY N/A 08/17/2016   Procedure: LAPAROSCOPY DIAGNOSTIC with left paratubal cystectomy;  Surgeon: Jerene Bears, MD;  Location: [facility];  Service: Gynecology;  Laterality: N/A;   SALPINGECTOMY Right    SURGICAL ENTRY ABOVE IS INCORRECT    OB History     Gravida  4   Para  3   Term  3   Preterm  0   AB  1   Living  3      SAB  1   IAB  0   Ectopic  0   Multiple  0   Live Births  3            Home Medications    Prior to Admission medications   Medication Sig Start Date End Date Taking? Authorizing Provider  amoxicillin-clavulanate (AUGMENTIN) 875-125 MG tablet Take 1 tablet by mouth 2 (two) times daily. 10/31/21  Yes Bing Neighbors, FNP  fluconazole (DIFLUCAN) 150 MG tablet Take 1 tablet (150 mg total) by mouth once for 1 dose. 10/31/21 10/31/21 Yes Bing Neighbors, FNP  acetaminophen (TYLENOL) 500 MG tablet Take 2 tablets (1,000 mg total) by mouth every 6 (six) hours as needed. Patient not taking: Reported on 03/25/2021 02/22/21 02/22/22  Katrinka Blazing, [place], CNM  cyclobenzaprine (FLEXERIL) 10 MG tablet Take 1 tablet (10 mg total) by mouth every 8 (eight) hours as needed for muscle spasms. Patient not taking: No sig reported 08/13/20   Federico Flake, MD  docusate sodium (COLACE)  100 MG capsule Take 1 capsule (100 mg total) by mouth daily as needed. Patient not taking: Reported on 03/25/2021 02/22/21 02/22/22  Katrinka BlazingSmith, [place], CNM  ferrous sulfate (FERROUSUL) 325 (65 FE) MG tablet Take 1 tablet (325 mg total) by mouth every other day. Patient not taking: Reported on 03/25/2021 01/29/21   Federico FlakeNewton, [place]aja Lymon Niles, MD  ibuprofen (ADVIL) 600 MG tablet Take 1 tablet (600 mg total) by mouth every 6 (six) hours. Patient not taking: Reported on 03/25/2021 02/22/21   Katrinka BlazingSmith, [place], CNM  NIFEdipine (PROCARDIA-XL/NIFEDICAL-XL) 30 MG 24 hr tablet Take 1 tablet (30 mg total) by mouth daily. Can increase to twice a day as needed for  symptomatic contractions 02/26/21   Anyanwu, Jethro BastosUgonna A, MD  oseltamivir (TAMIFLU) 75 MG capsule Take 1 capsule (75 mg total) by mouth every 12 (twelve) hours. 08/19/21   Mickie Bailate, Kelly H, NP  oxyCODONE (OXY IR/ROXICODONE) 5 MG immediate release tablet Take 1-2 tablets (5-10 mg total) by mouth every 6 (six) hours as needed for moderate pain. Patient not taking: Reported on 03/25/2021 02/22/21   Dorathy KinsmanSmith, [place], CNM  Prenatal MV-Min-Fe Fum-FA-DHA (PRENATAL 1 PO) Take 1 tablet by mouth daily. Patient not taking: Reported on 03/25/2021    [provider]    Family History Family History  Problem Relation Age of Onset   Breast cancer Mother [id number]   Osteoarthritis Maternal Grandmother    Hypertension Maternal Grandmother    Cancer Maternal Grandmother        blood cancer?   Kidney disease Maternal Grandmother    Cancer Maternal Grandfather [phone]       prostate cancer   Kidney disease Paternal Grandmother     Social History Social History   Tobacco Use   Smoking status: Never   Smokeless tobacco: Never  Vaping Use   Vaping Use: Never used  Substance Use Topics   Alcohol use: No   Drug use: No     Allergies   Sumatriptan   Review of Systems Review of Systems Pertinent negatives listed in HPI   Physical Exam Triage Vital Signs ED Triage Vitals [10/31/21 0816]  Enc Vitals Group     BP (!) 142/90     Pulse Rate (!) 106     Resp 18     Temp 98.2 F (36.8 C)     Temp Source Oral     SpO2 96 %     Weight      Height      Head Circumference      Peak Flow      Pain Score 10     Pain Loc      Pain Edu?      Excl. in GC?    No data found.  Updated Vital Signs BP (!) 142/90 (BP Location: Left Arm)    Pulse (!) 106    Temp 98.2 F (36.8 C) (Oral)    Resp 18    LMP 10/21/2021 (Approximate)    SpO2 96%    Breastfeeding No   Visual Acuity Right Eye Distance:   Left Eye Distance:   Bilateral Distance:    Right Eye Near:   Left Eye Near:    Bilateral Near:      Physical Exam General Appearance:    Alert, cooperative, no distress  HENT:   Normocephalic, left TM fluid noted, neck has bilateral anterior cervical nodes enlarged, pharynx erythematous with exudate, and sinuses nontender  Eyes:    PERRL, conjunctiva/corneas clear, EOM's intact  Lungs:     Clear to auscultation bilaterally, respirations unlabored  Heart:    Regular rate and rhythm  Neurologic:   Awake, alert, oriented x 3. No apparent focal neurological           defect.         UC Treatments / Results  Labs (all labs ordered are listed, but only abnormal results are displayed) Labs Reviewed  POCT RAPID STREP A (OFFICE) - Abnormal; Notable for the following components:      Result Value   Rapid Strep A Screen Positive (*)    All other components within normal limits    EKG   Radiology No results found.  Procedures Procedures (including critical care time)  Medications Ordered in UC Medications - No data to display  Initial Impression / Assessment and Plan / UC Course  I have reviewed the triage vital signs and the nursing notes.  Pertinent labs & imaging results that were available during my care of the patient were reviewed by me and considered in my medical decision making (see chart for details).    Strep throat with otalgia Treatment with Augmentin BID x 10 days .  Tylenol or ibuprofen as needed for pain or fever. RTC PRN Final Clinical Impressions(s) / UC Diagnoses   Final diagnoses:  Streptococcal sore throat     Discharge Instructions      Force fluids. Ibuprofen and or tylenol as needed for pain and or fever. Complete entire course of medications. Return as needed.     ED Prescriptions     Medication Sig Dispense Auth. Provider   amoxicillin-clavulanate (AUGMENTIN) 875-125 MG tablet Take 1 tablet by mouth 2 (two) times daily. 20 tablet Bing Neighbors, FNP   fluconazole (DIFLUCAN) 150 MG tablet Take 1 tablet (150 mg total) by mouth  once for 1 dose. 1 tablet Bing Neighbors, FNP      PDMP not reviewed this encounter.   Bing Neighbors, FNP 10/31/21 [phone]

## 2021-10-31 NOTE — ED Triage Notes (Signed)
Pt presents with ST and bilateral ear pain that started today.

## 2022-01-06 ENCOUNTER — Ambulatory Visit (INDEPENDENT_AMBULATORY_CARE_PROVIDER_SITE_OTHER): Payer: BC Managed Care – PPO | Admitting: Obstetrics & Gynecology

## 2022-01-06 ENCOUNTER — Encounter (HOSPITAL_BASED_OUTPATIENT_CLINIC_OR_DEPARTMENT_OTHER): Payer: Self-pay | Admitting: Obstetrics & Gynecology

## 2022-01-06 ENCOUNTER — Other Ambulatory Visit: Payer: Self-pay

## 2022-01-06 VITALS — BP 141/86 | HR 70 | Ht 66.0 in | Wt 173.0 lb

## 2022-01-06 DIAGNOSIS — Z803 Family history of malignant neoplasm of breast: Secondary | ICD-10-CM

## 2022-01-06 DIAGNOSIS — Z8759 Personal history of other complications of pregnancy, childbirth and the puerperium: Secondary | ICD-10-CM

## 2022-01-06 DIAGNOSIS — Z1231 Encounter for screening mammogram for malignant neoplasm of breast: Secondary | ICD-10-CM | POA: Diagnosis not present

## 2022-01-06 DIAGNOSIS — Z87410 Personal history of cervical dysplasia: Secondary | ICD-10-CM

## 2022-01-06 DIAGNOSIS — Z01419 Encounter for gynecological examination (general) (routine) without abnormal findings: Secondary | ICD-10-CM

## 2022-01-06 NOTE — Progress Notes (Signed)
36 y.o. [id number] Married White or Caucasian female here for annual exam.  Doing well.  Family is great.  Oldest is 16.  She is applying to the early college program.   ? ?Cycles are regular.  Had tubal ligation after delivery.   ? ?    ?Sexually active: Yes.    ?The current method of family planning is tubal ligation.    ?Smoker:  no ? ?Health Maintenance: ?Pap:  12/31/2019 Negative ?History of abnormal Pap:  LGSIL 2018 with +HR HPV ?MMG:  plan to start next eyar ?Colonoscopy:  guidelines reviewed ?Screening Labs: just had labs done for life insurance ? ? reports that she has never smoked. She has never used smokeless tobacco. She reports that she does not drink alcohol and does not use drugs. ? ?Past Medical History:  ?Diagnosis Date  ? Asthma   ? Juvenille  ? Family history of adverse reaction to anesthesia   ? mother has vomiting after anesthesia  ? Family history of breast cancer 03/18/2017  ? Mom at age 29. States mom had negative genetic testing.   ? History of gestational hypertension   ? History of pre-eclampsia   ? Migraine without aura   ? ? ?Past Surgical History:  ?Procedure Laterality Date  ? BILATERAL SALPINGECTOMY Right 08/17/2016  ? Procedure: RIGHT SALPINGECTOMY;  Surgeon: Jerene Bears, MD;  Location: [facility];  Service: Gynecology;  Laterality: Right;  ? CESAREAN SECTION WITH BILATERAL TUBAL LIGATION Bilateral 02/19/2021  ? Procedure: CESAREAN SECTION WITH BILATERAL TUBAL LIGATION;  Surgeon: Evergreen Bing, MD;  Location: [facility];  Service: Obstetrics;  Laterality: Bilateral;  ? CHROMOPERTUBATION Bilateral 08/17/2016  ? Procedure: CHROMOPERTUBATION;  Surgeon: Jerene Bears, MD;  Location: [facility];  Service: Gynecology;  Laterality: Bilateral;  ? LAPAROSCOPIC APPENDECTOMY N/A 01/14/2016  ? Procedure: APPENDECTOMY LAPAROSCOPIC;  Surgeon: Lattie Haw, MD;  Location: [facility];  Service: General;  Laterality: N/A;  ? LAPAROSCOPIC LYSIS OF ADHESIONS  08/17/2016  ? Procedure: LAPAROSCOPIC LYSIS OF  ADHESIONS;  Surgeon: Jerene Bears, MD;  Location: [facility];  Service: Gynecology;;  ? LAPAROSCOPY N/A 08/17/2016  ? Procedure: LAPAROSCOPY DIAGNOSTIC with left paratubal cystectomy;  Surgeon: Jerene Bears, MD;  Location: [facility];  Service: Gynecology;  Laterality: N/A;  ? SALPINGECTOMY Right   ? SURGICAL ENTRY ABOVE IS INCORRECT  ? ? ?No current outpatient medications on file.  ? ?No current facility-administered medications for this visit.  ? ? ?Family History  ?Problem Relation Age of Onset  ? Breast cancer Mother 58  ? Osteoarthritis Maternal Grandmother   ? Hypertension Maternal Grandmother   ? Cancer Maternal Grandmother   ?     blood cancer?  ? Kidney disease Maternal Grandmother   ? Cancer Maternal Grandfather 58  ?     prostate cancer  ? Kidney disease Paternal Grandmother   ? ? ?Review of Systems  ?All other systems reviewed and are negative. ? ?Exam:   ?BP (!) 141/86 (BP Location: Right Arm, Patient Position: Sitting, Cuff Size: Large)   Pulse 70   Ht 5\' 6"  (1.676 m) Comment: reported  Wt 173 lb (78.5 kg)   LMP 12/23/2021 (Approximate)   BMI 27.92 kg/m?   Height: 5\' 6"  (167.6 cm) (reported) ? ?General appearance: alert, cooperative and appears stated age ?Head: Normocephalic, without obvious abnormality, atraumatic ?Neck: no adenopathy, supple, symmetrical, trachea midline and thyroid normal to inspection and palpation ?Lungs: clear to auscultation bilaterally ?Breasts: normal appearance, no masses or tenderness ?  Heart: regular rate and rhythm ?Abdomen: soft, non-tender; bowel sounds normal; no masses,  no organomegaly ?Extremities: extremities normal, atraumatic, no cyanosis or edema ?Skin: Skin color, texture, turgor normal. No rashes or lesions ?Lymph nodes: Cervical, supraclavicular, and axillary nodes normal. ?No abnormal inguinal nodes palpated ?Neurologic: Grossly normal ? ? ?Pelvic: External genitalia:  no lesions ?             Urethra:  normal appearing urethra with no masses, tenderness or  lesions ?             Bartholins and Skenes: normal    ?             Vagina: normal appearing vagina with normal color and no discharge, no lesions ?             Cervix: no lesions ?             Pap taken: No. ?Bimanual Exam:  Uterus:  normal size, contour, position, consistency, mobility, non-tender ?             Adnexa: normal adnexa and no mass, fullness, tenderness ?              Rectovaginal: Confirms ?              Anus:  normal sphincter tone, no lesions ? ?Chaperone, Ina Homes, CMA, was present for exam. ? ?Assessment/Plan: ?1. Well woman exam with routine gynecological exam ?- pap neg with neg HR HPV 2021 ?- planning screening MMG next year.  Order placed. ?- colon cancer screening guidelines reviewed ?- vaccines reviewed/updated ? ?2. History of gestational hypertension ? ?3. Encounter for screening mammogram for malignant neoplasm of breast ? ?4. Family history of breast cancer in mother ?- MM 3D SCREEN BREAST BILATERAL; Future ? ?5. History of cervical dysplasia ?- LGSIL pap with cells suspicious for hgsil, biopsy cin 1 in 2018.  Two paps and HR HPVs negative since then. ? ? ?

## 2022-06-07 DIAGNOSIS — Z20822 Contact with and (suspected) exposure to covid-19: Secondary | ICD-10-CM | POA: Diagnosis not present

## 2022-06-07 DIAGNOSIS — J069 Acute upper respiratory infection, unspecified: Secondary | ICD-10-CM | POA: Diagnosis not present

## 2023-01-03 ENCOUNTER — Other Ambulatory Visit: Payer: BC Managed Care – PPO

## 2023-01-03 DIAGNOSIS — R519 Headache, unspecified: Secondary | ICD-10-CM

## 2023-01-03 DIAGNOSIS — Z8759 Personal history of other complications of pregnancy, childbirth and the puerperium: Secondary | ICD-10-CM | POA: Diagnosis not present

## 2023-01-03 DIAGNOSIS — Z13220 Encounter for screening for lipoid disorders: Secondary | ICD-10-CM

## 2023-01-04 LAB — COMPLETE METABOLIC PANEL WITH GFR
AG Ratio: 1.8 (calc) (ref 1.0–2.5)
ALT: 6 U/L (ref 6–29)
AST: 12 U/L (ref 10–30)
Albumin: 4.6 g/dL (ref 3.6–5.1)
Alkaline phosphatase (APISO): 53 U/L (ref 31–125)
BUN: 12 mg/dL (ref 7–25)
CO2: 26 mmol/L (ref 20–32)
Calcium: 9.4 mg/dL (ref 8.6–10.2)
Chloride: 107 mmol/L (ref 98–110)
Creat: 0.78 mg/dL (ref 0.50–0.97)
Globulin: 2.6 g/dL (calc) (ref 1.9–3.7)
Glucose, Bld: 87 mg/dL (ref 65–99)
Potassium: 4.1 mmol/L (ref 3.5–5.3)
Sodium: 141 mmol/L (ref 135–146)
Total Bilirubin: 0.4 mg/dL (ref 0.2–1.2)
Total Protein: 7.2 g/dL (ref 6.1–8.1)
eGFR: 100 mL/min/{1.73_m2} (ref >=60)

## 2023-01-04 LAB — CBC WITH DIFFERENTIAL/PLATELET
Absolute Monocytes: 397 cells/uL (ref 200–950)
Basophils Absolute: 58 cells/uL (ref 0–200)
Basophils Relative: 0.9 %
Eosinophils Absolute: 109 cells/uL (ref 15–500)
Eosinophils Relative: 1.7 %
HCT: 43.4 % (ref 35.0–45.0)
Hemoglobin: 14.4 g/dL (ref 11.7–15.5)
Lymphs Abs: 2214 cells/uL (ref 850–3900)
MCH: 29 pg (ref 27.0–33.0)
MCHC: 33.2 g/dL (ref 32.0–36.0)
MCV: 87.5 fL (ref 80.0–100.0)
MPV: 11.6 fL (ref 7.5–12.5)
Monocytes Relative: 6.2 %
Neutro Abs: 3622 cells/uL (ref 1500–7800)
Neutrophils Relative %: 56.6 %
Platelets: 244 10*3/uL (ref 140–400)
RBC: 4.96 10*6/uL (ref 3.80–5.10)
RDW: 12.2 % (ref 11.0–15.0)
Total Lymphocyte: 34.6 %
WBC: 6.4 10*3/uL (ref 3.8–10.8)

## 2023-01-04 LAB — LIPID PANEL
Cholesterol: 169 mg/dL (ref <200)
HDL: 49 mg/dL — ABNORMAL LOW (ref >=50)
LDL Cholesterol (Calc): 98 mg/dL (calc)
Non-HDL Cholesterol (Calc): 120 mg/dL (calc) (ref <130)
Total CHOL/HDL Ratio: 3.4 (calc) (ref <5.0)
Triglycerides: 128 mg/dL (ref <150)

## 2023-01-14 ENCOUNTER — Ambulatory Visit (HOSPITAL_BASED_OUTPATIENT_CLINIC_OR_DEPARTMENT_OTHER): Payer: BC Managed Care – PPO | Admitting: Obstetrics & Gynecology

## 2023-01-14 ENCOUNTER — Other Ambulatory Visit (HOSPITAL_COMMUNITY)
Admission: RE | Admit: 2023-01-14 | Discharge: 2023-01-14 | Disposition: A | Discharge status: Home / Self Care | Payer: BC Managed Care – PPO | Source: Ambulatory Visit | Attending: Obstetrics & Gynecology | Admitting: Obstetrics & Gynecology

## 2023-01-14 ENCOUNTER — Encounter (HOSPITAL_BASED_OUTPATIENT_CLINIC_OR_DEPARTMENT_OTHER): Payer: Self-pay | Admitting: Obstetrics & Gynecology

## 2023-01-14 VITALS — BP 132/90 | HR 81 | Ht 67.0 in | Wt 183.0 lb

## 2023-01-14 DIAGNOSIS — Z01419 Encounter for gynecological examination (general) (routine) without abnormal findings: Secondary | ICD-10-CM

## 2023-01-14 DIAGNOSIS — Z1283 Encounter for screening for malignant neoplasm of skin: Secondary | ICD-10-CM | POA: Diagnosis not present

## 2023-01-14 DIAGNOSIS — Z803 Family history of malignant neoplasm of breast: Secondary | ICD-10-CM

## 2023-01-14 DIAGNOSIS — Z124 Encounter for screening for malignant neoplasm of cervix: Secondary | ICD-10-CM | POA: Diagnosis not present

## 2023-01-14 DIAGNOSIS — Z8759 Personal history of other complications of pregnancy, childbirth and the puerperium: Secondary | ICD-10-CM

## 2023-01-14 DIAGNOSIS — Z1231 Encounter for screening mammogram for malignant neoplasm of breast: Secondary | ICD-10-CM | POA: Diagnosis not present

## 2023-01-14 NOTE — Patient Instructions (Signed)
Call [phone] to schedule an appointment at [facility].    Mammograms can also be self-scheduled online through MyChart.  

## 2023-01-14 NOTE — Progress Notes (Signed)
36 y.o. [id number] Married White or Caucasian female here for annual exam.  Oldest is a Holiday representative.  She would like go to [employer].  Younger kids are doing well.  Just got back from a week cruise.    Cycles are regular.    Would like to see dermatologist for skin survey.  Patient's last menstrual period was 01/05/2023.          Sexually active: Yes.    The current method of family planning is tubal ligation.    Exercising: no Smoker:  no  Health Maintenance: Pap:  12/31/2019 Negative History of abnormal Pap:  LGSIL with +HR HPV 2018 MMG:  starting this year Colonoscopy:  guidelines reviewed Screening Labs: has appt Monday with provider Monday   reports that she has never smoked. She has never used smokeless tobacco. She reports that she does not drink alcohol and does not use drugs.  Past Medical History:  Diagnosis Date   Asthma    Juvenille   Family history of adverse reaction to anesthesia    mother has vomiting after anesthesia   Family history of breast cancer 03/18/2017   Mom at age 38. States mom had negative genetic testing.    History of gestational hypertension    History of pre-eclampsia    Migraine without aura     Past Surgical History:  Procedure Laterality Date   BILATERAL SALPINGECTOMY Right 08/17/2016   Procedure: RIGHT SALPINGECTOMY;  Surgeon: Jerene Bears, MD;  Location: [facility];  Service: Gynecology;  Laterality: Right;   CESAREAN SECTION WITH BILATERAL TUBAL LIGATION Bilateral 02/19/2021   Procedure: CESAREAN SECTION WITH BILATERAL TUBAL LIGATION;  Surgeon: Mingus Bing, MD;  Location: [facility];  Service: Obstetrics;  Laterality: Bilateral;   CHROMOPERTUBATION Bilateral 08/17/2016   Procedure: CHROMOPERTUBATION;  Surgeon: Jerene Bears, MD;  Location: [facility];  Service: Gynecology;  Laterality: Bilateral;   LAPAROSCOPIC APPENDECTOMY N/A 01/14/2016   Procedure: APPENDECTOMY LAPAROSCOPIC;  Surgeon: Lattie Haw, MD;  Location: [facility];  Service: General;   Laterality: N/A;   LAPAROSCOPIC LYSIS OF ADHESIONS  08/17/2016   Procedure: LAPAROSCOPIC LYSIS OF ADHESIONS;  Surgeon: Jerene Bears, MD;  [facility]: [facility];  Service: Gynecology;;   LAPAROSCOPY N/A 08/17/2016   Procedure: LAPAROSCOPY DIAGNOSTIC with left paratubal cystectomy;  Surgeon: Jerene Bears, MD;  Location: [facility];  Service: Gynecology;  Laterality: N/A;   SALPINGECTOMY Right    SURGICAL ENTRY ABOVE IS INCORRECT    No current outpatient medications on file.   No current facility-administered medications for this visit.    Family History  Problem Relation Age of Onset   Breast cancer Mother 4   Osteoarthritis Maternal Grandmother    Hypertension Maternal Grandmother    Cancer Maternal Grandmother        blood cancer?   Kidney disease Maternal Grandmother    Cancer Maternal Grandfather 16       prostate cancer   Kidney disease Paternal Grandmother     ROS: Constitutional: negative Genitourinary:negative  Exam:   BP (!) 132/90   Pulse 81   Ht 5\' 7"  (1.702 m) Comment: Reported  Wt 183 lb (83 kg)   LMP 01/05/2023   BMI 28.66 kg/m   Height: 5\' 7"  (170.2 cm) (Reported)  General appearance: alert, cooperative and appears stated age Head: Normocephalic, without obvious abnormality, atraumatic Neck: no adenopathy, supple, symmetrical, trachea midline and thyroid normal to inspection and palpation Lungs: clear to auscultation bilaterally Breasts: normal appearance, no masses or  tenderness Heart: regular rate and rhythm Abdomen: soft, non-tender; bowel sounds normal; no masses,  no organomegaly Extremities: extremities normal, atraumatic, no cyanosis or edema Skin: Skin color, texture, turgor normal. No rashes or lesions Lymph nodes: Cervical, supraclavicular, and axillary nodes normal. No abnormal inguinal nodes palpated Neurologic: Grossly normal   Pelvic: External genitalia:  no lesions              Urethra:  normal appearing urethra with no masses, tenderness or  lesions              Bartholins and Skenes: normal                 Vagina: normal appearing vagina with normal color and no discharge, no lesions              Cervix: no lesions              Pap taken: Yes.   Bimanual Exam:  Uterus:  normal size, contour, position, consistency, mobility, non-tender              Adnexa: normal adnexa and no mass, fullness, tenderness               Rectovaginal: Confirms               Anus:  normal sphincter tone, no lesions  Chaperone, Ina Homesonya Yemassee, CMA, was present for exam.  Assessment/Plan: 1. Well woman exam with routine gynecological exam - Pap smear will be obtained otday - Colonoscopy guidelines reviewed - seeing new PCP Monday - vaccines reviewed/updated  2. Cervical cancer screening - Cytology - PAP( [facility])  3. Family history of breast cancer in mother - MM 3D SCREENING MAMMOGRAM BILATERAL BREAST; Future  4. Encounter for screening mammogram for malignant neoplasm of breast - MM 3D SCREENING MAMMOGRAM BILATERAL BREAST; Future  5. Skin cancer screening - Ambulatory referral to Dermatology  6. History of gestational hypertension - BP mildly elevated today.  Pt is going to monitor BPs over the weekend (has cuff) and follow up with PCP on Monday.

## 2023-01-17 ENCOUNTER — Ambulatory Visit: Payer: BC Managed Care – PPO | Admitting: Family Medicine

## 2023-01-17 ENCOUNTER — Encounter: Payer: Self-pay | Admitting: Family Medicine

## 2023-01-17 VITALS — BP 130/80 | HR 87 | Temp 98.3°F | Ht 67.0 in | Wt 182.0 lb

## 2023-01-17 DIAGNOSIS — G43111 Migraine with aura, intractable, with status migrainosus: Secondary | ICD-10-CM

## 2023-01-17 DIAGNOSIS — Z7689 Persons encountering health services in other specified circumstances: Secondary | ICD-10-CM | POA: Insufficient documentation

## 2023-01-17 DIAGNOSIS — Z0001 Encounter for general adult medical examination with abnormal findings: Secondary | ICD-10-CM

## 2023-01-17 LAB — CYTOLOGY - PAP
Adequacy: ABSENT
Comment: NEGATIVE
Diagnosis: NEGATIVE
High risk HPV: NEGATIVE

## 2023-01-17 NOTE — Assessment & Plan Note (Signed)
Chronic. Has seen neurology in past and has tried several medications. SNOOPP10 negative. Symptoms consistent with migraine. Provided with Nurtec 75mg  ODT daily tablets sample pack. Will provide rx if effective.

## 2023-01-17 NOTE — Patient Instructions (Signed)
It was great to meet you today and I'm excited to have you join the [facility] practice. I hope you had a positive experience today! If you feel so inclined, please feel free to recommend our practice to friends and family. Deakin Lacek, FNP-C  

## 2023-01-17 NOTE — Progress Notes (Signed)
New Patient Office Visit  Subjective    Patient ID: Kathy HeraldHeather M Silguero, female    DOB: 15-Apr-1986  Age: 37 y.o. MRN: [id number]  CC:  Chief Complaint  Patient presents with   Establish Care    HPI Kathy Aguilar presents to establish care. Oriented to practice routines and expectations. PMH includes appendectomy, tubal ligation, C-section, and migraines. Current concerns include uncontrolled migraines, she has tried Excedrin, they last 3 days, occur up to several times a week but she will go months without one. They occur behind her left eye and at her left temporal, with some photosensitivity. She has been evaluated by neurology in the past and has tried Imitrex but broke out in hives, Nortriptyline, and Verapamil without relief.  Cervical Ca screening done 01/14/23 Breast Ca maternal age 38, she is due this year, has it scheduled STI declined Tobacco declines Vaccines UTD    No outpatient encounter medications on file as of 01/17/2023.   No facility-administered encounter medications on file as of 01/17/2023.    Past Medical History:  Diagnosis Date   Asthma    Juvenille   Family history of adverse reaction to anesthesia    mother has vomiting after anesthesia   Family history of breast cancer 03/18/2017   Mom at age 38. States mom had negative genetic testing.    History of gestational hypertension    History of pre-eclampsia    Migraine without aura     Past Surgical History:  Procedure Laterality Date   BILATERAL SALPINGECTOMY Right 08/17/2016   Procedure: RIGHT SALPINGECTOMY;  Surgeon: Jerene BearsMary S Miller, MD;  Location: [facility];  Service: Gynecology;  Laterality: Right;   CESAREAN SECTION WITH BILATERAL TUBAL LIGATION Bilateral 02/19/2021   Procedure: CESAREAN SECTION WITH BILATERAL TUBAL LIGATION;  Surgeon: [place] BingPickens, Charlie, MD;  Location: [facility];  Service: Obstetrics;  Laterality: Bilateral;   CHROMOPERTUBATION Bilateral 08/17/2016   Procedure: CHROMOPERTUBATION;   Surgeon: Jerene BearsMary S Miller, MD;  Location: [facility];  Service: Gynecology;  Laterality: Bilateral;   LAPAROSCOPIC APPENDECTOMY N/A 01/14/2016   Procedure: APPENDECTOMY LAPAROSCOPIC;  Surgeon: Lattie Hawichard E Cooper, MD;  Location: [facility];  Service: General;  Laterality: N/A;   LAPAROSCOPIC LYSIS OF ADHESIONS  08/17/2016   Procedure: LAPAROSCOPIC LYSIS OF ADHESIONS;  Surgeon: Jerene BearsMary S Miller, MD;  [facility]: [facility];  Service: Gynecology;;   LAPAROSCOPY N/A 08/17/2016   Procedure: LAPAROSCOPY DIAGNOSTIC with left paratubal cystectomy;  Surgeon: Jerene BearsMary S Miller, MD;  Location: [facility];  Service: Gynecology;  Laterality: N/A;   SALPINGECTOMY Right    SURGICAL ENTRY ABOVE IS INCORRECT    Family History  Problem Relation Age of Onset   Breast cancer Mother [id number]   Osteoarthritis Maternal Grandmother    Hypertension Maternal Grandmother    Cancer Maternal Grandmother        blood cancer?   Kidney disease Maternal Grandmother    Cancer Maternal Grandfather [id number]       prostate cancer   Kidney disease Paternal Grandmother     Social History   Socioeconomic History   Marital status: Married    Spouse name: Not on file   Number of children: Not on file   Years of education: Not on file   Highest education level: Not on file  Occupational History   Not on file  Tobacco Use   Smoking status: Never   Smokeless tobacco: Never  Vaping Use   Vaping Use: Never used  Substance and Sexual Activity   Alcohol  use: No   Drug use: No   Sexual activity: Yes    Partners: Male    Birth control/protection: None  Other Topics Concern   Not on file  Social History Narrative   Not on file   Social Determinants of Health   Financial Resource Strain: Not on file  Food Insecurity: Not on file  Transportation Needs: Not on file  Physical Activity: Not on file  Stress: Not on file  Social Connections: Not on file  Intimate Partner Violence: Not on file    Review of Systems  Constitutional: Negative.   HENT:  Negative.    Eyes: Negative.   Respiratory: Negative.    Cardiovascular: Negative.   Gastrointestinal: Negative.   Genitourinary: Negative.   Musculoskeletal: Negative.   Skin: Negative.   Neurological:  Positive for headaches.  Endo/Heme/Allergies: Negative.   Psychiatric/Behavioral: Negative.    All other systems reviewed and are negative.       Objective    BP 130/80   Pulse 87   Temp 98.3 F (36.8 C) (Oral)   Ht 5\' 7"  (1.702 m)   Wt 182 lb (82.6 kg)   LMP 01/05/2023 (Approximate)   SpO2 98%   BMI 28.51 kg/m   Physical Exam Vitals and nursing note reviewed.  Constitutional:      Appearance: Normal appearance. She is normal weight.  HENT:     Head: Normocephalic and atraumatic.     Right Ear: Tympanic membrane, ear canal and external ear normal.     Left Ear: Tympanic membrane, ear canal and external ear normal.     Nose: Nose normal.     Mouth/Throat:     Mouth: Mucous membranes are moist.     Pharynx: Oropharynx is clear.  Eyes:     Extraocular Movements: Extraocular movements intact.     Conjunctiva/sclera: Conjunctivae normal.     Pupils: Pupils are equal, round, and reactive to light.  Cardiovascular:     Rate and Rhythm: Normal rate and regular rhythm.     Pulses: Normal pulses.     Heart sounds: Normal heart sounds.  Pulmonary:     Effort: Pulmonary effort is normal.     Breath sounds: Normal breath sounds.  Abdominal:     General: Bowel sounds are normal.     Palpations: Abdomen is soft.  Musculoskeletal:        General: Normal range of motion.     Cervical back: Normal range of motion and neck supple.  Skin:    General: Skin is warm and dry.     Capillary Refill: Capillary refill takes less than 2 seconds.  Neurological:     General: No focal deficit present.     Mental Status: She is alert and oriented to person, place, and time. Mental status is at baseline.  Psychiatric:        Mood and Affect: Mood normal.        Behavior: Behavior  normal.        Thought Content: Thought content normal.        Judgment: Judgment normal.         Assessment & Plan:   Problem List Items Addressed This Visit       Cardiovascular and Mediastinum   Intractable migraine with aura with status migrainosus    Chronic. Has seen neurology in past and has tried several medications. SNOOPP10 negative. Symptoms consistent with migraine. Provided with Nurtec 75mg  ODT daily tablets sample pack. Will provide rx  if effective.        Other   Encounter to establish care with new doctor - Primary    Today your medical history was reviewed and routine physical exam with labs was performed. Recommend 150 minutes of moderate intensity exercise weekly and consuming a well-balanced diet. Advised to stop smoking if a smoker, avoid smoking if a non-smoker, limit alcohol consumption to 1 drink per day for women and 2 drinks per day for men, and avoid illicit drug use. Counseled on safe sex practices and offered STI testing today. Counseled on the importance of sunscreen use. Counseled in mental health awareness and when to seek medical care. Vaccine maintenance discussed. Appropriate health maintenance items reviewed. Return to office in 1 year for annual physical exam.        Return in about 1 year (around 01/17/2024) for annual physical.   Park Meo, FNP

## 2023-01-17 NOTE — Progress Notes (Signed)
Pt was given 1 sample of Nurtec 7mg  per FNP today during OV

## 2023-01-17 NOTE — Assessment & Plan Note (Signed)

## 2023-02-01 ENCOUNTER — Encounter: Payer: Self-pay | Admitting: Family Medicine

## 2023-02-01 ENCOUNTER — Other Ambulatory Visit: Payer: Self-pay | Admitting: Family Medicine

## 2023-02-01 MED ORDER — NURTEC 75 MG PO TBDP: 75.0000 mg | ORAL_TABLET | Freq: Every day | ORAL | 0 refills | Status: DC

## 2023-02-04 ENCOUNTER — Other Ambulatory Visit: Payer: Self-pay | Admitting: Obstetrics & Gynecology

## 2023-02-04 DIAGNOSIS — Z1231 Encounter for screening mammogram for malignant neoplasm of breast: Secondary | ICD-10-CM

## 2023-02-09 ENCOUNTER — Ambulatory Visit (HOSPITAL_BASED_OUTPATIENT_CLINIC_OR_DEPARTMENT_OTHER)
Admission: RE | Admit: 2023-02-09 | Discharge: 2023-02-09 | Disposition: A | Discharge status: Home / Self Care | Payer: BC Managed Care – PPO | Source: Ambulatory Visit | Attending: Obstetrics & Gynecology | Admitting: Obstetrics & Gynecology

## 2023-02-09 ENCOUNTER — Encounter (HOSPITAL_BASED_OUTPATIENT_CLINIC_OR_DEPARTMENT_OTHER): Payer: Self-pay | Admitting: Radiology

## 2023-02-09 DIAGNOSIS — Z1231 Encounter for screening mammogram for malignant neoplasm of breast: Secondary | ICD-10-CM

## 2023-02-11 ENCOUNTER — Other Ambulatory Visit: Payer: Self-pay | Admitting: Obstetrics & Gynecology

## 2023-02-11 DIAGNOSIS — R928 Other abnormal and inconclusive findings on diagnostic imaging of breast: Secondary | ICD-10-CM

## 2023-03-08 ENCOUNTER — Other Ambulatory Visit: Payer: BC Managed Care – PPO

## 2023-03-23 ENCOUNTER — Other Ambulatory Visit: Payer: Self-pay | Admitting: Obstetrics & Gynecology

## 2023-03-23 ENCOUNTER — Ambulatory Visit
Admission: RE | Admit: 2023-03-23 | Discharge: 2023-03-23 | Disposition: A | Discharge status: Home / Self Care | Payer: BC Managed Care – PPO | Source: Ambulatory Visit | Attending: Obstetrics & Gynecology | Admitting: Obstetrics & Gynecology

## 2023-03-23 DIAGNOSIS — N6310 Unspecified lump in the right breast, unspecified quadrant: Secondary | ICD-10-CM

## 2023-03-23 DIAGNOSIS — N6321 Unspecified lump in the left breast, upper outer quadrant: Secondary | ICD-10-CM | POA: Diagnosis not present

## 2023-03-23 DIAGNOSIS — R928 Other abnormal and inconclusive findings on diagnostic imaging of breast: Secondary | ICD-10-CM

## 2023-03-23 DIAGNOSIS — Z803 Family history of malignant neoplasm of breast: Secondary | ICD-10-CM | POA: Diagnosis not present

## 2023-03-29 ENCOUNTER — Ambulatory Visit
Admission: RE | Admit: 2023-03-29 | Discharge: 2023-03-29 | Disposition: A | Discharge status: Home / Self Care | Payer: BC Managed Care – PPO | Source: Ambulatory Visit | Attending: Obstetrics & Gynecology | Admitting: Obstetrics & Gynecology

## 2023-03-29 ENCOUNTER — Other Ambulatory Visit: Payer: Self-pay | Admitting: Obstetrics & Gynecology

## 2023-03-29 DIAGNOSIS — N6310 Unspecified lump in the right breast, unspecified quadrant: Secondary | ICD-10-CM

## 2023-03-29 DIAGNOSIS — N6323 Unspecified lump in the left breast, lower outer quadrant: Secondary | ICD-10-CM | POA: Diagnosis not present

## 2023-03-29 DIAGNOSIS — N62 Hypertrophy of breast: Secondary | ICD-10-CM | POA: Diagnosis not present

## 2023-03-29 DIAGNOSIS — D242 Benign neoplasm of left breast: Secondary | ICD-10-CM | POA: Diagnosis not present

## 2023-03-29 DIAGNOSIS — N6321 Unspecified lump in the left breast, upper outer quadrant: Secondary | ICD-10-CM | POA: Diagnosis not present

## 2023-03-29 DIAGNOSIS — N6320 Unspecified lump in the left breast, unspecified quadrant: Secondary | ICD-10-CM

## 2023-03-29 DIAGNOSIS — R928 Other abnormal and inconclusive findings on diagnostic imaging of breast: Secondary | ICD-10-CM | POA: Diagnosis not present

## 2023-03-29 HISTORY — PX: BREAST BIOPSY: SHX20

## 2023-04-01 ENCOUNTER — Encounter (HOSPITAL_BASED_OUTPATIENT_CLINIC_OR_DEPARTMENT_OTHER): Payer: Self-pay | Admitting: Obstetrics & Gynecology

## 2023-04-13 ENCOUNTER — Other Ambulatory Visit: Payer: Self-pay | Admitting: Family Medicine

## 2023-04-15 ENCOUNTER — Encounter: Payer: Self-pay | Admitting: Family Medicine

## 2023-04-18 ENCOUNTER — Other Ambulatory Visit: Payer: Self-pay | Admitting: Family Medicine

## 2023-04-18 MED ORDER — NURTEC 75 MG PO TBDP: 75.0000 mg | ORAL_TABLET | Freq: Every day | ORAL | 0 refills | Status: DC

## 2023-05-06 ENCOUNTER — Other Ambulatory Visit: Payer: Self-pay | Admitting: Family Medicine

## 2023-05-30 DIAGNOSIS — M5431 Sciatica, right side: Secondary | ICD-10-CM | POA: Diagnosis not present

## 2023-06-09 DIAGNOSIS — H9201 Otalgia, right ear: Secondary | ICD-10-CM | POA: Diagnosis not present

## 2023-06-09 DIAGNOSIS — J00 Acute nasopharyngitis [common cold]: Secondary | ICD-10-CM | POA: Diagnosis not present

## 2023-06-09 DIAGNOSIS — Z03818 Encounter for observation for suspected exposure to other biological agents ruled out: Secondary | ICD-10-CM | POA: Diagnosis not present

## 2023-07-05 ENCOUNTER — Ambulatory Visit: Payer: BC Managed Care – PPO | Admitting: Family Medicine

## 2023-07-05 ENCOUNTER — Encounter: Payer: Self-pay | Admitting: Family Medicine

## 2023-07-05 VITALS — BP 122/84 | HR 120 | Temp 99.8°F | Ht 67.0 in | Wt 183.0 lb

## 2023-07-05 DIAGNOSIS — J029 Acute pharyngitis, unspecified: Secondary | ICD-10-CM

## 2023-07-05 DIAGNOSIS — J069 Acute upper respiratory infection, unspecified: Secondary | ICD-10-CM | POA: Diagnosis not present

## 2023-07-05 LAB — INFLUENZA A AND B AG, IMMUNOASSAY
INFLUENZA A ANTIGEN: NOT DETECTED
INFLUENZA B ANTIGEN: NOT DETECTED

## 2023-07-05 NOTE — Patient Instructions (Signed)
Your symptoms and exam findings are most consistent with a viral upper respiratory infection. These usually run their course in 5-7 days. Unfortunately, antibiotics don't work against viruses and just increase your risk of other issues such as diarrhea, yeast infections, and resistant infections.  If your symptoms last longer than 10 days and/or you start feeling worse with facial pain, high fever, cough, shortness of breath or start feeling significantly worse, please call us right away to be further evaluated.  Some things that can make you feel better are: - Increased rest - Increasing fluid with water/sugar free electrolytes - Acetaminophen as needed for fever/pain.  - Salt water gargling, chloraseptic spray and throat lozenges - OTC guaifenesin (Mucinex).  - Saline sinus flushes or a neti pot.  - Humidifying the air.

## 2023-07-05 NOTE — Progress Notes (Signed)
Subjective:  HPI: Kathy Aguilar is a 37 y.o. female presenting on 07/05/2023 for Acute Visit (body aches, sore throat, low grade fever (doesn't have a COVID test at [facility]) - [facility])   HPI Patient is in today for 2 days of sore throat, low grade fever, body aches, chills, headache, ear pain. Denies cough, shortness of breath, wheezing, nasal congestion, runny nose No known sick exposures but she was in [place] Has tried nothing.  Review of Systems  All other systems reviewed and are negative.   Relevant past medical history reviewed and updated as indicated.   Past Medical History:  Diagnosis Date   Asthma    Juvenille   Family history of adverse reaction to anesthesia    mother has vomiting after anesthesia   Family history of breast cancer 03/18/2017   Mom at age 3. States mom had negative genetic testing.    Fibroadenoma of breast    History of gestational hypertension    History of pre-eclampsia    Migraine without aura      Past Surgical History:  Procedure Laterality Date   BILATERAL SALPINGECTOMY Right 08/17/2016   Procedure: RIGHT SALPINGECTOMY;  Surgeon: Jerene Bears, MD;  Location: [facility];  Service: Gynecology;  Laterality: Right;   BREAST BIOPSY Right 03/29/2023   MM RT BREAST BX W LOC DEV 1ST LESION IMAGE BX SPEC STEREO GUIDE 03/29/2023 [facility]   BREAST BIOPSY Left 03/29/2023   [place] LT BREAST BX W LOC DEV 1ST LESION IMG BX SPEC US GUIDE 03/29/2023 [facility]   BREAST BIOPSY Left 03/29/2023   [place] LT BREAST BX W LOC DEV EA ADD LESION IMG BX SPEC US GUIDE 03/29/2023 [facility]   CESAREAN SECTION WITH BILATERAL TUBAL LIGATION Bilateral 02/19/2021   Procedure: CESAREAN SECTION WITH BILATERAL TUBAL LIGATION;  Surgeon: Gully Bing, MD;  Location: [facility];  Service: Obstetrics;  Laterality: Bilateral;   CHROMOPERTUBATION Bilateral 08/17/2016   Procedure: CHROMOPERTUBATION;  Surgeon: Jerene Bears, MD;  Location: [facility];  Service:  Gynecology;  Laterality: Bilateral;   LAPAROSCOPIC APPENDECTOMY N/A 01/14/2016   Procedure: APPENDECTOMY LAPAROSCOPIC;  Surgeon: Lattie Haw, MD;  Location: [facility];  Service: General;  Laterality: N/A;   LAPAROSCOPIC LYSIS OF ADHESIONS  08/17/2016   Procedure: LAPAROSCOPIC LYSIS OF ADHESIONS;  Surgeon: Jerene Bears, MD;  Location: [facility];  Service: Gynecology;;   LAPAROSCOPY N/A 08/17/2016   Procedure: LAPAROSCOPY DIAGNOSTIC with left paratubal cystectomy;  Surgeon: Jerene Bears, MD;  Location: [facility];  Service: Gynecology;  Laterality: N/A;   SALPINGECTOMY Right    SURGICAL ENTRY ABOVE IS INCORRECT    Allergies and medications reviewed and updated.   Current Outpatient Medications:    NURTEC 75 MG TBDP, TAKE 1 TABLET BY MOUTH EVERY DAY, Disp: 18 tablet, Rfl: 0  Allergies  Allergen Reactions   Sumatriptan Rash    Objective:   BP 122/84   Pulse (!) 120   Temp 99.8 F (37.7 C) (Oral)   Ht 5\' 7"  (1.702 m)   Wt 183 lb (83 kg)   LMP 06/28/2023 (Approximate)   SpO2 98%   BMI 28.66 kg/m      07/05/2023   11:57 AM 01/17/2023    8:31 AM 01/14/2023   10:02 AM  Vitals with BMI  Height 5\' 7"  5\' 7"    Weight 183 lbs 182 lbs   BMI 28.66 28.5   Systolic 122 130 161  Diastolic 84 80 90  Pulse 120  87      Physical Exam Vitals and nursing note reviewed.  Constitutional:      Appearance: Normal appearance. She is normal weight.  HENT:     Head: Normocephalic and atraumatic.     Right Ear: Tympanic membrane, ear canal and external ear normal.     Left Ear: Tympanic membrane, ear canal and external ear normal.     Nose: Congestion present.     Mouth/Throat:     Mouth: Mucous membranes are moist.     Pharynx: Oropharynx is clear.  Eyes:     Conjunctiva/sclera: Conjunctivae normal.  Cardiovascular:     Rate and Rhythm: Normal rate and regular rhythm.     Pulses: Normal pulses.     Heart sounds: Normal heart sounds.  Pulmonary:     Effort: Pulmonary effort is normal.      Breath sounds: Normal breath sounds.  Musculoskeletal:     Cervical back: No tenderness.  Lymphadenopathy:     Cervical: No cervical adenopathy.  Skin:    General: Skin is warm and dry.  Neurological:     General: No focal deficit present.     Mental Status: She is alert and oriented to person, place, and time. Mental status is at baseline.  Psychiatric:        Mood and Affect: Mood normal.        Behavior: Behavior normal.        Thought Content: Thought content normal.        Judgment: Judgment normal.     Assessment & Plan:  Viral URI Assessment & Plan: Reassured patient that symptoms and exam findings are most consistent with a viral upper respiratory infection and explained lack of efficacy of antibiotics against viruses.  Discussed expected course and features suggestive of secondary bacterial infection.  Continue supportive care. Increase fluid intake with water or electrolyte solution like pedialyte. Encouraged acetaminophen as needed for fever/pain. Encouraged salt water gargling, chloraseptic spray and throat lozenges. Encouraged OTC guaifenesin. Encouraged saline sinus flushes and/or neti with humidified air.    Orders: -     Influenza A and B Ag, Immunoassay -     STREP GROUP A AG, W/REFLEX TO CULT  Other orders -     Culture, Group A Strep     Follow up plan: Return if symptoms worsen or fail to improve.  Park Meo, FNP

## 2023-07-05 NOTE — Assessment & Plan Note (Signed)

## 2023-07-07 LAB — STREP GROUP A AG, W/REFLEX TO CULT: Streptococcus Group A AG: NOT DETECTED

## 2023-07-07 LAB — CULTURE, GROUP A STREP
MICRO NUMBER:: 15508505
SPECIMEN QUALITY:: ADEQUATE

## 2023-07-20 ENCOUNTER — Encounter: Payer: Self-pay | Admitting: Dermatology

## 2023-07-20 ENCOUNTER — Ambulatory Visit: Payer: BC Managed Care – PPO | Admitting: Dermatology

## 2023-07-20 VITALS — BP 126/81

## 2023-07-20 DIAGNOSIS — L578 Other skin changes due to chronic exposure to nonionizing radiation: Secondary | ICD-10-CM | POA: Diagnosis not present

## 2023-07-20 DIAGNOSIS — D225 Melanocytic nevi of trunk: Secondary | ICD-10-CM

## 2023-07-20 DIAGNOSIS — L814 Other melanin hyperpigmentation: Secondary | ICD-10-CM

## 2023-07-20 DIAGNOSIS — Z1283 Encounter for screening for malignant neoplasm of skin: Secondary | ICD-10-CM

## 2023-07-20 DIAGNOSIS — W908XXA Exposure to other nonionizing radiation, initial encounter: Secondary | ICD-10-CM

## 2023-07-20 DIAGNOSIS — D485 Neoplasm of uncertain behavior of skin: Secondary | ICD-10-CM

## 2023-07-20 DIAGNOSIS — L821 Other seborrheic keratosis: Secondary | ICD-10-CM

## 2023-07-20 DIAGNOSIS — D492 Neoplasm of unspecified behavior of bone, soft tissue, and skin: Secondary | ICD-10-CM | POA: Diagnosis not present

## 2023-07-20 DIAGNOSIS — D239 Other benign neoplasm of skin, unspecified: Secondary | ICD-10-CM

## 2023-07-20 DIAGNOSIS — D1801 Hemangioma of skin and subcutaneous tissue: Secondary | ICD-10-CM

## 2023-07-20 DIAGNOSIS — D229 Melanocytic nevi, unspecified: Secondary | ICD-10-CM

## 2023-07-20 HISTORY — DX: Other benign neoplasm of skin, unspecified: D23.9

## 2023-07-20 NOTE — Patient Instructions (Signed)

## 2023-07-20 NOTE — Progress Notes (Unsigned)
   New Patient Visit   Subjective  Kathy Aguilar is a 37 y.o. female who presents for the following: Skin Cancer Screening and Full Body Skin Exam - No personal or family history of skin cancer. Husband thinks a mole on her back is getting bigger. She uses sunscreen when she is in the sun.  The patient presents for Total-Body Skin Exam (TBSE) for skin cancer screening and mole check. The patient has spots, moles and lesions to be evaluated, some may be new or changing and the patient may have concern these could be cancer.    The following portions of the chart were reviewed this encounter and updated as appropriate: medications, allergies, medical history  Review of Systems:  No other skin or systemic complaints except as noted in HPI or Assessment and Plan.  Objective  Well appearing patient in no apparent distress; mood and affect are within normal limits.  A full examination was performed including scalp, head, eyes, ears, nose, lips, neck, chest, axillae, abdomen, back, buttocks, bilateral upper extremities, bilateral lower extremities, hands, feet, fingers, toes, fingernails, and toenails. All findings within normal limits unless otherwise noted below.   Relevant physical exam findings are noted in the Assessment and Plan.  Left Upper Back 4 x 2 mm irregular dark brown macule         Assessment & Plan   SKIN CANCER SCREENING PERFORMED TODAY.  ACTINIC DAMAGE - Chronic condition, secondary to cumulative UV/sun exposure - diffuse scaly erythematous macules with underlying dyspigmentation - Recommend daily broad spectrum sunscreen SPF 30+ to sun-exposed areas, reapply every 2 hours as needed.  - Staying in the shade or wearing long sleeves, sun glasses (UVA+UVB protection) and wide brim hats (4-inch brim around the entire circumference of the hat) are also recommended for sun protection.  - Call for new or changing lesions.  LENTIGINES, SEBORRHEIC KERATOSES, HEMANGIOMAS  - Benign normal skin lesions - Benign-appearing - Call for any changes  MELANOCYTIC NEVI - Tan-brown and/or pink-flesh-colored symmetric macules and papules - Benign appearing on exam today - Observation - Call clinic for new or changing moles - Recommend daily use of broad spectrum spf 30+ sunscreen to sun-exposed areas.   Congenital nevus of left lower leg - Benign appearing, observe.         Neoplasm of uncertain behavior of skin Left Upper Back  Skin / nail biopsy Type of biopsy: tangential   Informed consent: discussed and consent obtained   Timeout: patient name, date of birth, surgical site, and procedure verified   Procedure prep:  Patient was prepped and draped in usual sterile fashion Prep type:  Isopropyl alcohol Anesthesia: the lesion was anesthetized in a standard fashion   Anesthetic:  1% lidocaine w/ epinephrine 1-100,000 buffered w/ 8.4% NaHCO3 Instrument used: flexible razor blade   Hemostasis achieved with: pressure, aluminum chloride and electrodesiccation   Outcome: patient tolerated procedure well   Post-procedure details: sterile dressing applied and wound care instructions given   Dressing type: bandage and petrolatum    Specimen 1 - Surgical pathology Differential Diagnosis: R/O dysplastic nevus Check Margins: No    Return in about 1 year (around 07/19/2024) for TBSE.  I, Joanie Coddington, CMA, am acting as scribe for [employer], DO .   Documentation: I have reviewed the above documentation for accuracy and completeness, and I agree with the above.  Langston Reusing, DO

## 2023-07-26 LAB — SURGICAL PATHOLOGY

## 2023-07-27 NOTE — Progress Notes (Signed)
HI Jetta,  Please call pt and notify that their bx results showed an abnormal mole that requires a full excision in office with Dr Onalee Hua or Dr. Caralyn Guile

## 2023-08-15 ENCOUNTER — Telehealth: Payer: Self-pay | Admitting: Dermatology

## 2023-08-15 NOTE — Telephone Encounter (Addendum)
Patient stated that she received her results on MyChart but hadn't heard from anyone.

## 2023-08-17 ENCOUNTER — Telehealth: Payer: Self-pay

## 2023-08-17 NOTE — Telephone Encounter (Signed)
-----   Message from Langston Reusing sent at 07/27/2023  8:56 AM EDT ----- HI Shon Millet,  Please call pt and notify that their bx results showed an abnormal mole that requires a full excision in office with Dr Onalee Hua or Dr. Caralyn Guile

## 2023-08-17 NOTE — Telephone Encounter (Signed)
LVMTCB

## 2023-09-03 ENCOUNTER — Other Ambulatory Visit: Payer: Self-pay | Admitting: Family Medicine

## 2023-11-07 ENCOUNTER — Ambulatory Visit: Payer: 59 | Admitting: Family Medicine

## 2023-11-07 ENCOUNTER — Encounter: Payer: Self-pay | Admitting: Family Medicine

## 2023-11-07 VITALS — BP 140/78 | HR 94 | Temp 98.1°F | Ht 67.0 in | Wt 178.0 lb

## 2023-11-07 DIAGNOSIS — E663 Overweight: Secondary | ICD-10-CM

## 2023-11-07 NOTE — Assessment & Plan Note (Signed)
Patient here to discuss weight management. Her goal weight is 150lbs. She would like to try medications to help her lose weight however in absence of comorbidities with her BMI they are not indicated. We discussed treatment options and I have recommended she work on improving her diet, increasing her protein, and watching her caloric intake. I also encouraged 30 minutes of moderate intensity exercise that incorporates strength training 5 days per week. Will refer to [facility].

## 2023-11-07 NOTE — Progress Notes (Signed)
Subjective:  HPI: Kathy Aguilar is a 38 y.o. female presenting on 11/07/2023 for Follow-up (Weight loss and health)   HPI Patient is in today for weight management, her goal weight is 178lbs. She currently walks 1 mile per day a few days a week and is active with her 3 children. She admits to consuming an unhealthy diet but has tried low carb diets. Has tried no medications in the past. Kathy Aguilar PMH includes asthma. She would like referral to [facility].  Review of Systems  All other systems reviewed and are negative.   Relevant past medical history reviewed and updated as indicated.   Past Medical History:  Diagnosis Date   Asthma    Juvenille   Dysplastic nevus 07/20/2023   Left upper back - severe- needs excision   Family history of adverse reaction to anesthesia    mother has vomiting after anesthesia   Family history of breast cancer 03/18/2017   Mom at age 63. States mom had negative genetic testing.    Fibroadenoma of breast    History of gestational hypertension    History of pre-eclampsia    Migraine without aura      Past Surgical History:  Procedure Laterality Date   BILATERAL SALPINGECTOMY Right 08/17/2016   Procedure: RIGHT SALPINGECTOMY;  Surgeon: Jerene Bears, MD;  Location: [facility];  Service: Gynecology;  Laterality: Right;   BREAST BIOPSY Right 03/29/2023   MM RT BREAST BX W LOC DEV 1ST LESION IMAGE BX SPEC STEREO GUIDE 03/29/2023 [facility]   BREAST BIOPSY Left 03/29/2023   [place] LT BREAST BX W LOC DEV 1ST LESION IMG BX SPEC US GUIDE 03/29/2023 [facility]   BREAST BIOPSY Left 03/29/2023   [place] LT BREAST BX W LOC DEV EA ADD LESION IMG BX SPEC US GUIDE 03/29/2023 [facility]   CESAREAN SECTION WITH BILATERAL TUBAL LIGATION Bilateral 02/19/2021   Procedure: CESAREAN SECTION WITH BILATERAL TUBAL LIGATION;  Surgeon: Towner Bing, MD;  Location: [facility];  Service: Obstetrics;  Laterality: Bilateral;   CHROMOPERTUBATION Bilateral 08/17/2016    Procedure: CHROMOPERTUBATION;  Surgeon: Jerene Bears, MD;  Location: [facility];  Service: Gynecology;  Laterality: Bilateral;   LAPAROSCOPIC APPENDECTOMY N/A 01/14/2016   Procedure: APPENDECTOMY LAPAROSCOPIC;  Surgeon: Lattie Haw, MD;  Location: [facility];  Service: General;  Laterality: N/A;   LAPAROSCOPIC LYSIS OF ADHESIONS  08/17/2016   Procedure: LAPAROSCOPIC LYSIS OF ADHESIONS;  Surgeon: Jerene Bears, MD;  Location: [facility];  Service: Gynecology;;   LAPAROSCOPY N/A 08/17/2016   Procedure: LAPAROSCOPY DIAGNOSTIC with left paratubal cystectomy;  Surgeon: Jerene Bears, MD;  Location: [facility];  Service: Gynecology;  Laterality: N/A;   SALPINGECTOMY Right    SURGICAL ENTRY ABOVE IS INCORRECT    Allergies and medications reviewed and updated.   Current Outpatient Medications:    NURTEC 75 MG TBDP, TAKE 1 TABLET BY MOUTH EVERY DAY, Disp: 18 tablet, Rfl: 0  Allergies  Allergen Reactions   Sumatriptan Rash    Objective:   BP (!) 140/78   Pulse 94   Temp 98.1 F (36.7 C) (Oral)   Ht 5\' 7"  (1.702 m)   Wt 178 lb (80.7 kg)   LMP 10/24/2023 (Approximate)   SpO2 97%   BMI 27.88 kg/m      11/07/2023    3:24 PM 07/20/2023   11:33 AM 07/05/2023   11:57 AM  Vitals with BMI  Height 5\' 7"   5\' 7"   Weight 178 lbs  183 lbs  BMI 27.87  28.66  Systolic 140 126 409  Diastolic 78 81 84  Pulse 94  120     Physical Exam Vitals and nursing note reviewed.  Constitutional:      Appearance: Normal appearance. She is overweight.  HENT:     Head: Normocephalic and atraumatic.  Skin:    General: Skin is warm and dry.  Neurological:     General: No focal deficit present.     Mental Status: She is alert and oriented to person, place, and time. Mental status is at baseline.  Psychiatric:        Mood and Affect: Mood normal.        Behavior: Behavior normal.        Thought Content: Thought content normal.        Judgment: Judgment normal.     Assessment & Plan:  Overweight (BMI  25.0-29.9) Assessment & Plan: Patient here to discuss weight management. Her goal weight is 150lbs. She would like to try medications to help her lose weight however in absence of comorbidities with her BMI they are not indicated. We discussed treatment options and I have recommended she work on improving her diet, increasing her protein, and watching her caloric intake. I also encouraged 30 minutes of moderate intensity exercise that incorporates strength training 5 days per week. Will refer to [facility].  Orders: -     Amb Ref to Medical Weight Management     Follow up plan: Return in about 3 months (around 02/05/2024) for annual physical with labs 1 week prior.  Park Meo, FNP

## 2023-11-08 ENCOUNTER — Encounter: Payer: Self-pay | Admitting: Dermatology

## 2023-11-09 ENCOUNTER — Ambulatory Visit (INDEPENDENT_AMBULATORY_CARE_PROVIDER_SITE_OTHER): Payer: 59 | Admitting: Dermatology

## 2023-11-09 ENCOUNTER — Encounter: Payer: Self-pay | Admitting: Dermatology

## 2023-11-09 VITALS — BP 132/84 | HR 95

## 2023-11-09 DIAGNOSIS — D239 Other benign neoplasm of skin, unspecified: Secondary | ICD-10-CM

## 2023-11-09 DIAGNOSIS — D235 Other benign neoplasm of skin of trunk: Secondary | ICD-10-CM

## 2023-11-09 DIAGNOSIS — L988 Other specified disorders of the skin and subcutaneous tissue: Secondary | ICD-10-CM | POA: Diagnosis not present

## 2023-11-09 NOTE — Progress Notes (Signed)
Follow-Up Visit   Subjective  Kathy Aguilar is a 38 y.o. female who presents for the following: Excision of DN severe on the left upper back, biopsied by Dr. Onalee Hua.  The following portions of the chart were reviewed this encounter and updated as appropriate: medications, allergies, medical history  Review of Systems:  No other skin or systemic complaints except as noted in HPI or Assessment and Plan.  Objective  Well appearing patient in no apparent distress; mood and affect are within normal limits.  A focused examination was performed of the following areas:  Back  Relevant physical exam findings are noted in the Assessment and Plan.   Left Upper Back Tan-brown and/or pink-flesh-colored symmetric macules and papules.    Assessment & Plan   DYSPLASTIC NEVUS Left Upper Back Skin excision - Left Upper Back  Excision method:  elliptical Lesion length (cm):  1.1 Lesion width (cm):  0.8 Margin per side (cm):  0.5 Total excision diameter (cm):  2.1 Informed consent: discussed and consent obtained   Timeout: patient name, date of birth, surgical site, and procedure verified   Procedure prep:  Patient was prepped and draped in usual sterile fashion Prep type:  Chlorhexidine Anesthesia: the lesion was anesthetized in a standard fashion   Anesthetic:  1% lidocaine w/ epinephrine 1-100,000 buffered w/ 8.4% NaHCO3 Instrument used: #15 blade   Hemostasis achieved with: suture, pressure and electrodesiccation   Outcome: patient tolerated procedure well with no complications   Post-procedure details: sterile dressing applied and wound care instructions given   Dressing type: petrolatum, pressure dressing and bandage    Skin repair - Left Upper Back Complexity:  Complex Final length (cm):  4.2 Informed consent: discussed and consent obtained   Timeout: patient name, date of birth, surgical site, and procedure verified   Procedure prep:  Patient was prepped and draped in usual  sterile fashion Prep type:  Chlorhexidine Anesthesia: the lesion was anesthetized in a standard fashion   Anesthetic:  1% lidocaine w/ epinephrine 1-100,000 buffered w/ 8.4% NaHCO3 Reason for type of repair: reduce tension to allow closure and preserve normal anatomy   Undermining: edges undermined   Subcutaneous layers (deep stitches):  Suture size:  3-0 Suture type: PDS (polydioxanone)   Stitches:  Buried vertical mattress Fine/surface layer approximation (top stitches):  Suture type: cyanoacrylate tissue glue   Suture removal (days):  0 Hemostasis achieved with: suture, pressure and electrodesiccation Outcome: patient tolerated procedure well with no complications   Post-procedure details: sterile dressing applied and wound care instructions given   Dressing type: petrolatum, bandage and pressure dressing    The surgical wound was then cleaned, prepped, and re-anesthetized as above. Wound edges were undermined extensively along at least one entire edge and at a distance equal to or greater than the width of the defect (see wound defect size above) in order to achieve closure and decrease wound tension and anatomic distortion. Redundant tissue repair including standing cone removal was performed. Hemostasis was achieved with electrocautery. Subcutaneous and epidermal tissues were approximated with the above sutures. The surgical site was then lightly scrubbed with sterile, saline-soaked gauze. Steri-strips were applied, and the area was then bandaged using Vaseline ointment, non-adherent gauze, gauze pads, and tape to provide an adequate pressure dressing. The patient tolerated the procedure well, was given detailed written and verbal wound care instructions, and was discharged in good condition.   The patient will follow-up: PRN.  Return if symptoms worsen or fail to improve.  I,  Bernette Redbird, Surg Tech III, am acting as scribe for Gwenith Daily, MD.   Documentation: I have reviewed the  above documentation for accuracy and completeness, and I agree with the above.  Gwenith Daily, MD

## 2023-11-11 LAB — SURGICAL PATHOLOGY

## 2023-11-12 HISTORY — PX: MOLE REMOVAL: SHX2046

## 2023-11-17 ENCOUNTER — Ambulatory Visit: Payer: BC Managed Care – PPO | Admitting: Family Medicine

## 2023-11-24 ENCOUNTER — Ambulatory Visit: Payer: 59 | Admitting: Dermatology

## 2023-11-24 ENCOUNTER — Encounter: Payer: Self-pay | Admitting: Dermatology

## 2023-11-24 VITALS — BP 155/96

## 2023-11-24 DIAGNOSIS — D485 Neoplasm of uncertain behavior of skin: Secondary | ICD-10-CM

## 2023-11-24 DIAGNOSIS — D492 Neoplasm of unspecified behavior of bone, soft tissue, and skin: Secondary | ICD-10-CM | POA: Diagnosis not present

## 2023-11-24 DIAGNOSIS — Z86018 Personal history of other benign neoplasm: Secondary | ICD-10-CM

## 2023-11-24 DIAGNOSIS — L905 Scar conditions and fibrosis of skin: Secondary | ICD-10-CM

## 2023-11-24 DIAGNOSIS — D225 Melanocytic nevi of trunk: Secondary | ICD-10-CM | POA: Diagnosis not present

## 2023-11-24 NOTE — Patient Instructions (Addendum)
Post-Operative Scar Care: Education and Recommendations  Following your procedure, it's important to care for your scar to promote optimal healing and minimize its appearance. Proper post-operative care can help ensure that the scar heals well, and with time, it may become less noticeable. Below are key recommendations for scar care, including scar massage and the use of silicone scar gels or sheets.  1. General Scar Care Tips: -  Keep the wound clean and dry: Follow your healthcare provider's instructions for wound care, including cleaning the site and changing dressings as needed. -  Avoid sun exposure: Direct sunlight can darken scars and make them more noticeable. Once your wound has healed, apply sunscreen (SPF 30 or higher) to protect the scar from UV rays.  2. Scar Massage: - Start after healing: Wait until the scar has fully healed, with no scabs or open areas (usually 4-6 weeks after surgery). Your healthcare provider will give you specific guidance on when to begin. - Technique: Gently massage the scar in a circular motion for 5-10 minutes, 2-3 times per day. This helps to soften the tissue, reduce swelling, and improve the overall appearance of the scar. - Pressure: Apply gentle, firm pressure during the massage to break down the dense tissue that may form during healing. This helps to prevent the formation of keloids or hypertrophic scars. - Use lotion or ointment: Consider using a mild, fragrance-free lotion or vitamin E ointment to help lubricate the area during massage.  3. Silicone Scar Gels or Sheets: - When to start: Once your wound has healed completely, typically around 4-6 weeks, you can begin using silicone-based scar gels or sheets. These have been shown to improve scar appearance by hydrating the tissue and reducing inflammation. - How to use silicone gels: Apply a thin layer of the gel to the scar and allow it to dry before covering with clothing. You can use the  gel multiple times a day, depending on your provider's recommendation. - How to use silicone sheets: Cut the sheet to fit the size of your scar, and apply it directly to the healed scar. Wear it for 12-24 hours a day, and replace the sheet every few days as directed. - Benefits: Silicone helps reduce redness, flatten the scar, and improve its texture. Continued use over several months can lead to significant improvement in the appearance of the scar.  4. What to Expect: - Healing process: Scars generally take time to mature. The first few months may show redness or swelling, but this usually improves as healing progresses. - Long-term care: Scarring is a natural part of the healing process. While you cannot completely eliminate a scar, proper care can significantly improve its appearance over time. - Patience: It can take up to a year for a scar to fully mature, so it's important to be consistent with scar care and follow-up appointments with your provider.  5. When to Contact Your Healthcare Provider: - If you notice signs of infection (increased redness, warmth, drainage, or pain). - If your scar becomes unusually raised, itchy, or changes in color significantly. - If you have concerns about the appearance of your scar or experience unusual symptoms. - By following these guidelines, you can support your body's natural healing process and help ensure the best possible outcome for your scar. If you have any questions or concerns, please don't hesitate to contact our office.   Patient Handout: Wound Care for Skin Biopsy Site  Taking Care of Your Skin  Biopsy Site  Proper care of the biopsy site is essential for promoting healing and minimizing scarring. This handout provides instructions on how to care for your biopsy site to ensure optimal recovery.  1. Cleaning the Wound:  Clean the biopsy site daily with gentle soap and water. Gently pat the area dry with a clean, soft towel. Avoid harsh  scrubbing or rubbing the area, as this can irritate the skin and delay healing.  2. Applying Aquaphor and Bandage:  After cleaning the wound, apply a thin layer of Aquaphor ointment to the biopsy site. Cover the area with a sterile bandage to protect it from dirt, bacteria, and friction. Change the bandage daily or as needed if it becomes soiled or wet.  3. Continued Care for One Week:  Repeat the cleaning, Aquaphor application, and bandaging process daily for one week following the biopsy procedure. Keeping the wound clean and moist during this initial healing period will help prevent infection and promote optimal healing.  4. Massaging Aquaphor into the Area:  ---After one week, discontinue the use of bandages but continue to apply Aquaphor to the biopsy site. ----Gently massage the Aquaphor into the area using circular motions. ---Massaging the skin helps to promote circulation and prevent the formation of scar tissue.   Additional Tips:  Avoid exposing the biopsy site to direct sunlight during the healing process, as this can cause hyperpigmentation or worsen scarring. If you experience any signs of infection, such as increased redness, swelling, warmth, or drainage from the wound, contact your healthcare provider immediately. Follow any additional instructions provided by your healthcare provider for caring for the biopsy site and managing any discomfort. Conclusion:  Taking proper care of your skin biopsy site is crucial for ensuring optimal healing and minimizing scarring. By following these instructions for cleaning, applying Aquaphor, and massaging the area, you can promote a smooth and successful recovery. If you have any questions or concerns about caring for your biopsy site, don't hesitate to contact your healthcare provider for guidance.     Important Information  Due to recent changes in healthcare laws, you may see results of your pathology and/or laboratory studies on  MyChart before the doctors have had a chance to review them. We understand that in some cases there may be results that are confusing or concerning to you. Please understand that not all results are received at the same time and often the doctors may need to interpret multiple results in order to provide you with the best plan of care or course of treatment. Therefore, we ask that you please give [place] 2 business days to thoroughly review all your results before contacting the office for clarification. Should we see a critical lab result, you will be contacted sooner.   If You Need Anything After Your Visit  If you have any questions or concerns for your doctor, please call our main line at [phone] If no one answers, please leave a voicemail as directed and we will return your call as soon as possible. Messages left after 4 pm will be answered the following business day.   You may also send [place] a message via MyChart. We typically respond to MyChart messages within 1-2 business days.  For prescription refills, please ask your pharmacy to contact our office. Our fax number is [phone].  If you have an urgent issue when the clinic is closed that cannot wait until the next business day, you can page your doctor at the number below.  Please note that while we do our best to be available for urgent issues outside of office hours, we are not available 24/7.   If you have an urgent issue and are unable to reach [place], you may choose to seek medical care at your doctor's office, retail clinic, urgent care center, or emergency room.  If you have a medical emergency, please immediately call 911 or go to the emergency department. In the event of inclement weather, please call our main line at [phone] for an update on the status of any delays or closures.  Dermatology Medication Tips: Please keep the boxes that topical medications come in in order to help keep track of the instructions about where  and how to use these. Pharmacies typically print the medication instructions only on the boxes and not directly on the medication tubes.   If your medication is too expensive, please contact our office at [phone] or send [place] a message through MyChart.   We are unable to tell what your co-pay for medications will be in advance as this is different depending on your insurance coverage. However, we may be able to find a substitute medication at lower cost or fill out paperwork to get insurance to cover a needed medication.   If a prior authorization is required to get your medication covered by your insurance company, please allow [place] 1-2 business days to complete this process.  Drug prices often vary depending on where the prescription is filled and some pharmacies may offer cheaper prices.  The website www.goodrx.com contains coupons for medications through different pharmacies. The prices here do not account for what the cost may be with help from insurance (it may be cheaper with your insurance), but the website can give you the price if you did not use any insurance.  - You can print the associated coupon and take it with your prescription to the pharmacy.  - You may also stop by our office during regular business hours and pick up a GoodRx coupon card.  - If you need your prescription sent electronically to a different pharmacy, notify our office through [facility] or by phone at [phone]

## 2023-11-24 NOTE — Progress Notes (Signed)
   Follow-Up Visit   Subjective  Kathy Aguilar is a 38 y.o. female who presents for the following: She has 2 moles on her back that she would like biopsied for her peace of mind. She is concerned about them.  The following portions of the chart were reviewed this encounter and updated as appropriate: medications, allergies, medical history  Review of Systems:  No other skin or systemic complaints except as noted in HPI or Assessment and Plan.  Objective  Well appearing patient in no apparent distress; mood and affect are within normal limits.  A focused examination was performed of the following areas: back  Relevant exam findings are noted in the Assessment and Plan.  Right Upper Back 0.7 brown pedunculated papule  Right Lower Back 0.5 cm brown pedunculated papule   Assessment & Plan   NEOPLASM OF UNCERTAIN BEHAVIOR OF SKIN (2) Right Upper Back Epidermal / dermal shaving  Lesion diameter (cm):  0.7 Informed consent: discussed and consent obtained   Timeout: patient name, date of birth, surgical site, and procedure verified   Procedure prep:  Patient was prepped and draped in usual sterile fashion Prep type:  Isopropyl alcohol Anesthesia: the lesion was anesthetized in a standard fashion   Anesthetic:  1% lidocaine w/ epinephrine 1-100,000 buffered w/ 8.4% NaHCO3 Instrument used: flexible razor blade   Hemostasis achieved with: pressure, aluminum chloride and electrodesiccation   Outcome: patient tolerated procedure well   Post-procedure details: sterile dressing applied and wound care instructions given   Dressing type: bandage and petrolatum   Specimen 1 - Surgical pathology Differential Diagnosis: Nevus vs other  Check Margins: No Right Lower Back Epidermal / dermal shaving  Lesion diameter (cm):  0.5 Informed consent: discussed and consent obtained   Timeout: patient name, date of birth, surgical site, and procedure verified   Procedure prep:  Patient was  prepped and draped in usual sterile fashion Prep type:  Isopropyl alcohol Anesthesia: the lesion was anesthetized in a standard fashion   Anesthetic:  1% lidocaine w/ epinephrine 1-100,000 buffered w/ 8.4% NaHCO3 Instrument used: flexible razor blade   Hemostasis achieved with: pressure, aluminum chloride and electrodesiccation   Outcome: patient tolerated procedure well   Post-procedure details: sterile dressing applied and wound care instructions given   Dressing type: bandage and petrolatum   Specimen 2 - Surgical pathology Differential Diagnosis: Nevus vs other  Check Margins: No  Scar s/p WLE for DN severe on the left upper back, treated on 01/202/205, repaired with linear closure - Reassured that wound has healed well - Discussed that scars take up to 12 months to mature from the date of surgery - Recommend SPF 30+ to scar daily to prevent purple color - OK to start scar massage at 4-6 weeks post-op - Can consider silicone based products for scar healing  Return for Follow up as scheduled, [facility].   Documentation: I have reviewed the above documentation for accuracy and completeness, and I agree with the above.  Gwenith Daily, MD

## 2023-11-25 LAB — SURGICAL PATHOLOGY

## 2023-11-26 ENCOUNTER — Other Ambulatory Visit: Payer: Self-pay | Admitting: Family Medicine

## 2023-11-30 ENCOUNTER — Telehealth: Payer: Self-pay | Admitting: Family Medicine

## 2023-12-01 NOTE — Telephone Encounter (Signed)
 PA Nurtec sent to plan

## 2023-12-02 ENCOUNTER — Telehealth: Payer: Self-pay

## 2023-12-02 NOTE — Telephone Encounter (Signed)
Called and spoke w/[facility] PA dept and they stated that pt's PA-Nurtec has been denied, will fax denial paper to office.

## 2023-12-02 NOTE — Telephone Encounter (Signed)
Copied from CRM [id number]. Topic: Clinical - Medication Question >> Dec 02, 2023 10:13 AM Antony Haste wrote: Reason for CRM: [facility] Staff need clarification on this patient's prior authorization for Nurtec 75MG  dispersible tablets. They have a few questions regarding this prescription.  Callback [phone]

## 2023-12-05 ENCOUNTER — Telehealth: Payer: Self-pay

## 2023-12-05 NOTE — Telephone Encounter (Signed)
 Insurance denied Nurtec? Pls advise?

## 2023-12-05 NOTE — Telephone Encounter (Signed)
 Copied from CRM [id number]. Topic: Clinical - Prescription Issue >> Dec 05, 2023  1:38 PM Shelah Lewandowsky wrote: Reason for CRM: NURTEC 75 MG TBDP - insurance will not cover 90 day supply, max is 31 days, please call patient [phone]

## 2023-12-07 ENCOUNTER — Encounter (INDEPENDENT_AMBULATORY_CARE_PROVIDER_SITE_OTHER): Payer: Self-pay

## 2023-12-07 NOTE — Telephone Encounter (Signed)
 Called and spoke w/pt and told pt that rx for nurtec was called in for a 30 day supply confirmed per pharmacist at [facility] and coverage was still denied.   Per pt stated that she called the insurance and supposedly they told her that it will be cover as long as it was called in for a 30 day, supply. Per pt will call insurance and update pcp regarding what to do next.

## 2024-01-12 ENCOUNTER — Other Ambulatory Visit: Payer: BC Managed Care – PPO

## 2024-01-12 DIAGNOSIS — E663 Overweight: Secondary | ICD-10-CM

## 2024-01-12 DIAGNOSIS — Z8759 Personal history of other complications of pregnancy, childbirth and the puerperium: Secondary | ICD-10-CM

## 2024-01-12 DIAGNOSIS — R519 Headache, unspecified: Secondary | ICD-10-CM

## 2024-01-12 DIAGNOSIS — Z13220 Encounter for screening for lipoid disorders: Secondary | ICD-10-CM

## 2024-01-18 ENCOUNTER — Encounter: Payer: Self-pay | Admitting: Family Medicine

## 2024-01-18 ENCOUNTER — Ambulatory Visit (INDEPENDENT_AMBULATORY_CARE_PROVIDER_SITE_OTHER): Payer: BC Managed Care – PPO | Admitting: Family Medicine

## 2024-01-18 ENCOUNTER — Other Ambulatory Visit: Payer: Self-pay | Admitting: Family Medicine

## 2024-01-18 VITALS — BP 160/98 | HR 81 | Temp 98.3°F | Ht 67.0 in | Wt 176.6 lb

## 2024-01-18 DIAGNOSIS — I10 Essential (primary) hypertension: Secondary | ICD-10-CM

## 2024-01-18 DIAGNOSIS — Z0000 Encounter for general adult medical examination without abnormal findings: Secondary | ICD-10-CM

## 2024-01-18 DIAGNOSIS — F411 Generalized anxiety disorder: Secondary | ICD-10-CM

## 2024-01-18 DIAGNOSIS — E663 Overweight: Secondary | ICD-10-CM

## 2024-01-18 DIAGNOSIS — Z0001 Encounter for general adult medical examination with abnormal findings: Secondary | ICD-10-CM | POA: Diagnosis not present

## 2024-01-18 DIAGNOSIS — E782 Mixed hyperlipidemia: Secondary | ICD-10-CM | POA: Insufficient documentation

## 2024-01-18 DIAGNOSIS — R0683 Snoring: Secondary | ICD-10-CM

## 2024-01-18 MED ORDER — SEMAGLUTIDE(0.25 OR 0.5MG/DOS) 2 MG/1.5ML ~~LOC~~ SOPN: PEN_INJECTOR | SUBCUTANEOUS | 1 refills | Status: DC

## 2024-01-18 MED ORDER — FLUOXETINE HCL 10 MG PO CAPS: 10.0000 mg | ORAL_CAPSULE | Freq: Every day | ORAL | 3 refills | Status: DC

## 2024-01-18 MED ORDER — WEGOVY 0.25 MG/0.5ML ~~LOC~~ SOAJ: SUBCUTANEOUS | 0 refills | Status: DC

## 2024-01-18 NOTE — Assessment & Plan Note (Signed)
 BP elevated today in office. Also elevated at recent visit to derm. She reports her BP has fluctuated in past. She does endorse increase in anxiety and stress. She would like to try weight loss and lifestyle modifications. Encouraged to monitor BP at home over next 2 weeks and return to office with readings and home cuff.  Recommend heart healthy diet such as Mediterranean diet with whole grains, fruits, vegetable, fish, lean meats, nuts, and olive oil. Limit salt. Encouraged moderate walking, 3-5 times/week for 30-50 minutes each session. Aim for at least 150 minutes.week. Goal should be pace of 3 miles/hours, or walking 1.5 miles in 30 minutes. Avoid tobacco products. Avoid excess alcohol. Take medications as prescribed and bring medications and blood pressure log with cuff to each office visit. Seek medical care for chest pain, palpitations, shortness of breath with exertion, dizziness/lightheadedness, vision changes, recurrent headaches, or swelling of extremities. Follow up in 2 weeks.

## 2024-01-18 NOTE — Progress Notes (Signed)
 Complete physical exam  Patient: Kathy Aguilar   DOB: 07/17/1986   38 y.o. Female  MRN: [id number]  Subjective:    Chief Complaint  Patient presents with   Annual Exam    Kathy Aguilar is a 38 y.o. female who presents today for a complete physical exam. She reports consuming a general diet. The patient does not participate in regular exercise at present. She generally feels well. She reports sleeping well. She does have additional problems to discuss today. Concerns include anxiety and weight management.   Kathy Aguilar reports increased worry and stress at home. Her husband works nights and she cares for her children in the evenings after work. This is interfering with sleep as well as her patience with her family. Has not tried therapy and does not think she could find time to see a therapist. She is interested in medication management at this time, has not tried medication in the past. This has been occurring for over 6 months however is getting harder to manage.   Kathy Aguilar is also frustrated by her failed weight loss efforts. She has tried healthier lifestyle without success. Would like to try medications to help with weight loss, help control her cholesterol and blood pressure, and help with her stress and anxiety. Denies personal history of pancreatitis and personal or family history of MTC or MEN2.      01/18/2024    8:30 AM 11/07/2023    3:53 PM 01/17/2023    8:40 AM  GAD 7 : Generalized Anxiety Score  Nervous, Anxious, on Edge 1 2 1   Control/stop worrying 1 2 1   Worry too much - different things 1 1 1   Trouble relaxing 3 2 2   Restless 2 1 1   Easily annoyed or irritable 2 2 3   Afraid - awful might happen 0  0  Total GAD 7 Score 10  9  Anxiety Difficulty Somewhat difficult Somewhat difficult Somewhat difficult      Most recent fall risk assessment:    01/18/2024    8:30 AM  Fall Risk   Falls in the past year? 0  Number falls in past yr: 0  Injury with Fall? 0     Most  recent depression screenings:    01/18/2024    8:30 AM 11/07/2023    3:53 PM  PHQ 2/9 Scores  PHQ - 2 Score 2 2  PHQ- 9 Score 13 11    Dental: No current dental problems and Receives regular dental care  Patient Active Problem List   Diagnosis Date Noted   Hypertension 01/18/2024   Moderate mixed hyperlipidemia not requiring statin therapy 01/18/2024   Generalized anxiety disorder 01/18/2024   Overweight (BMI 25.0-29.9) 11/07/2023   Viral URI 07/05/2023   Encounter to establish care with new doctor 01/17/2023   Intractable migraine with aura with status migrainosus 01/17/2023   History of gestational hypertension 01/06/2022   H/O unilateral salpingectomy- Right removed, Left [place]  12/19/2020   Headache disorder 02/16/2020   Past Medical History:  Diagnosis Date   Asthma    Juvenille   Dysplastic nevus 07/20/2023   Left upper back - severe- needs excision   Family history of adverse reaction to anesthesia    mother has vomiting after anesthesia   Family history of breast cancer 03/18/2017   Mom at age 12. States mom had negative genetic testing.    Fibroadenoma of breast    History of gestational hypertension    History of pre-eclampsia  Migraine without aura    Past Surgical History:  Procedure Laterality Date   BILATERAL SALPINGECTOMY Right 08/17/2016   Procedure: RIGHT SALPINGECTOMY;  Surgeon: Jerene Bears, MD;  Location: [facility];  Service: Gynecology;  Laterality: Right;   BREAST BIOPSY Right 03/29/2023   MM RT BREAST BX W LOC DEV 1ST LESION IMAGE BX SPEC STEREO GUIDE 03/29/2023 [facility]   BREAST BIOPSY Left 03/29/2023   [place] LT BREAST BX W LOC DEV 1ST LESION IMG BX SPEC US GUIDE 03/29/2023 [facility]   BREAST BIOPSY Left 03/29/2023   [place] LT BREAST BX W LOC DEV EA ADD LESION IMG BX SPEC US GUIDE 03/29/2023 [facility]   CESAREAN SECTION WITH BILATERAL TUBAL LIGATION Bilateral 02/19/2021   Procedure: CESAREAN SECTION WITH BILATERAL TUBAL  LIGATION;  Surgeon: Quapaw Bing, MD;  Location: [facility];  Service: Obstetrics;  Laterality: Bilateral;   CHROMOPERTUBATION Bilateral 08/17/2016   Procedure: CHROMOPERTUBATION;  Surgeon: Jerene Bears, MD;  Location: [facility];  Service: Gynecology;  Laterality: Bilateral;   LAPAROSCOPIC APPENDECTOMY N/A 01/14/2016   Procedure: APPENDECTOMY LAPAROSCOPIC;  Surgeon: Lattie Haw, MD;  Location: [facility];  Service: General;  Laterality: N/A;   LAPAROSCOPIC LYSIS OF ADHESIONS  08/17/2016   Procedure: LAPAROSCOPIC LYSIS OF ADHESIONS;  Surgeon: Jerene Bears, MD;  Location: [facility];  Service: Gynecology;;   LAPAROSCOPY N/A 08/17/2016   Procedure: LAPAROSCOPY DIAGNOSTIC with left paratubal cystectomy;  Surgeon: Jerene Bears, MD;  Location: [facility];  Service: Gynecology;  Laterality: N/A;   SALPINGECTOMY Right    SURGICAL ENTRY ABOVE IS INCORRECT   Social History   Tobacco Use   Smoking status: Never   Smokeless tobacco: Never  Vaping Use   Vaping status: Never Used  Substance Use Topics   Alcohol use: No   Drug use: No   Family History  Problem Relation Age of Onset   Breast cancer Mother 22   Osteoarthritis Maternal Grandmother    Hypertension Maternal Grandmother    Cancer Maternal Grandmother        blood cancer?   Kidney disease Maternal Grandmother    Cancer Maternal Grandfather 12       prostate cancer   Kidney disease Paternal Grandmother    Allergies  Allergen Reactions   Sumatriptan Rash      Patient Care Team: Park Meo, FNP as PCP - General (Family Medicine)   Outpatient Medications Prior to Visit  Medication Sig   NURTEC 75 MG TBDP TAKE 1 TABLET BY MOUTH EVERY DAY   No facility-administered medications prior to visit.    Review of Systems  Constitutional: Negative.   HENT: Negative.    Eyes: Negative.   Respiratory: Negative.    Cardiovascular: Negative.   Gastrointestinal: Negative.   Genitourinary: Negative.   Musculoskeletal: Negative.    Skin: Negative.   Neurological:  Positive for headaches.  Endo/Heme/Allergies: Negative.   Psychiatric/Behavioral:  The patient is nervous/anxious.   All other systems reviewed and are negative.         Objective:     BP (!) 160/98 (BP Location: Left Arm, Patient Position: Sitting, Cuff Size: Normal)   Pulse 81   Temp 98.3 F (36.8 C)   Ht 5\' 7"  (1.702 m)   Wt 176 lb 9 oz (80.1 kg)   LMP 12/25/2023 (Approximate)   SpO2 98%   BMI 27.65 kg/m  BP Readings from Last 3 Encounters:  01/18/24 (!) 160/98  11/24/23 (!) 155/96  11/09/23 132/84  Wt Readings from Last 3 Encounters:  01/18/24 176 lb 9 oz (80.1 kg)  11/07/23 178 lb (80.7 kg)  07/05/23 183 lb (83 kg)      Physical Exam Vitals and nursing note reviewed.  Constitutional:      Appearance: Normal appearance. She is overweight.  HENT:     Head: Normocephalic and atraumatic.     Right Ear: Tympanic membrane, ear canal and external ear normal.     Left Ear: Tympanic membrane, ear canal and external ear normal.     Nose: Nose normal.     Mouth/Throat:     Mouth: Mucous membranes are moist.     Pharynx: Oropharynx is clear.  Eyes:     Extraocular Movements: Extraocular movements intact.     Conjunctiva/sclera: Conjunctivae normal.     Pupils: Pupils are equal, round, and reactive to light.  Cardiovascular:     Rate and Rhythm: Normal rate and regular rhythm.     Pulses: Normal pulses.     Heart sounds: Normal heart sounds.  Pulmonary:     Effort: Pulmonary effort is normal.     Breath sounds: Normal breath sounds.  Abdominal:     General: Bowel sounds are normal.     Palpations: Abdomen is soft.  Musculoskeletal:        General: Normal range of motion.     Cervical back: Normal range of motion and neck supple.  Skin:    General: Skin is warm and dry.     Capillary Refill: Capillary refill takes less than 2 seconds.  Neurological:     General: No focal deficit present.     Mental Status: She is alert  and oriented to person, place, and time. Mental status is at baseline.  Psychiatric:        Mood and Affect: Mood normal.        Behavior: Behavior normal.        Thought Content: Thought content normal.        Judgment: Judgment normal.      No results found for any visits on 01/18/24. Last CBC Lab Results  Component Value Date   WBC 8.5 01/12/2024   HGB 14.6 01/12/2024   HCT 43.9 01/12/2024   MCV 89.6 01/12/2024   MCH 29.8 01/12/2024   RDW 12.9 01/12/2024   PLT 248 01/12/2024   Last metabolic panel Lab Results  Component Value Date   GLUCOSE 83 01/12/2024   NA 138 01/12/2024   K 4.1 01/12/2024   CL 102 01/12/2024   CO2 27 01/12/2024   BUN 14 01/12/2024   CREATININE 0.73 01/12/2024   EGFR 100 01/03/2023   CALCIUM 9.4 01/12/2024   PROT 7.0 01/12/2024   ALBUMIN 2.7 (L) 02/19/2021   LABGLOB 2.8 12/31/2019   AGRATIO 1.6 12/31/2019   BILITOT 0.5 01/12/2024   ALKPHOS 123 02/19/2021   AST 14 01/12/2024   ALT 9 01/12/2024   ANIONGAP 9 02/19/2021   Last lipids Lab Results  Component Value Date   CHOL 198 01/12/2024   HDL 55 01/12/2024   LDLCALC 120 (H) 01/12/2024   LDLDIRECT 123.0 08/23/2016   TRIG 119 01/12/2024   CHOLHDL 3.6 01/12/2024        Assessment & Plan:    Routine Health Maintenance and Physical Exam  Immunization History  Administered Date(s) Administered   Influenza,inj,Quad PF,6+ Mos 07/20/2017, 07/22/2018, 10/16/2019, 09/11/2020   Janssen (J&J) SARS-COV-2 Vaccination 12/22/2019   Tdap 07/31/2015, 07/20/2017, 01/28/2021    Health Maintenance  Topic Date Due   COVID-19 Vaccine (2 - Janssen risk series) 02/03/2024 (Originally 01/19/2020)   INFLUENZA VACCINE  05/11/2024   Cervical Cancer Screening (HPV/Pap Cotest)  01/14/2028   DTaP/Tdap/Td (4 - Td or Tdap) 01/29/2031   Hepatitis C Screening  Completed   HIV Screening  Completed   HPV VACCINES  Aged Out    Discussed health benefits of physical activity, and encouraged her to engage in  regular exercise appropriate for her age and condition.  Problem List Items Addressed This Visit     Overweight (BMI 25.0-29.9) - Primary   BMI 27.65 with associated hypertension and hyperlipidemia. I believe she would benefit greatly from weight loss and she has been unsuccessful with diet and exercise alone. Will start Ozempic 0.25mg  weekly for 4 weeks and increase as tolerated. Stressed importance of low calorie, high protein, heart healthy diet and moderate intensity exercise 150 minutes weekly. This is 3-5 times weekly for 30-50 minutes each session. Goal should be pace of 3 miles/hours, or walking 1.5 miles in 30 minutes and include strength training. Discussed risks of obesity.  Follow up in 4 weeks after starting medication.      Hypertension   BP elevated today in office. Also elevated at recent visit to derm. She reports her BP has fluctuated in past. She does endorse increase in anxiety and stress. She would like to try weight loss and lifestyle modifications. Encouraged to monitor BP at home over next 2 weeks and return to office with readings and home cuff.  Recommend heart healthy diet such as Mediterranean diet with whole grains, fruits, vegetable, fish, lean meats, nuts, and olive oil. Limit salt. Encouraged moderate walking, 3-5 times/week for 30-50 minutes each session. Aim for at least 150 minutes.week. Goal should be pace of 3 miles/hours, or walking 1.5 miles in 30 minutes. Avoid tobacco products. Avoid excess alcohol. Take medications as prescribed and bring medications and blood pressure log with cuff to each office visit. Seek medical care for chest pain, palpitations, shortness of breath with exertion, dizziness/lightheadedness, vision changes, recurrent headaches, or swelling of extremities. Follow up in 2 weeks.       Moderate mixed hyperlipidemia not requiring statin therapy   Will work on weight loss and lifestyle modifications. I recommend consuming a heart healthy diet  such as Mediterranean diet or DASH diet with whole grains, fruits, vegetable, fish, lean meats, nuts, and olive oil. Limit sweets and processed foods. I also encourage moderate intensity exercise 150 minutes weekly. This is 3-5 times weekly for 30-50 minutes each session. Goal should be pace of 3 miles/hours, or walking 1.5 miles in 30 minutes. The ASCVD Risk score (Arnett DK, et al., 2019) failed to calculate for the following reasons:   The 2019 ASCVD risk score is only valid for ages 78 to 97       Generalized anxiety disorder   GAD 10. Discussed treatment options. Will consider CBT. Start Prozac 10mg  daily. Encouraged healthy lifestyle including adequate sleep, well balanced diet, heart healthy exercise, and stress mitigation. Follow up in 2 weeks for BP      Relevant Medications   FLUoxetine (PROZAC) 10 MG capsule   Other Visit Diagnoses       Physical exam, annual          Return in about 2 weeks (around 02/01/2024) for hypertension.     Park Meo, FNP

## 2024-01-18 NOTE — Assessment & Plan Note (Addendum)
 GAD 10. Discussed treatment options. Will consider CBT. Start Prozac 10mg  daily. Encouraged healthy lifestyle including adequate sleep, well balanced diet, heart healthy exercise, and stress mitigation. Adding TSH, vitamin D and B12 to labs Follow up in 2 weeks for BP

## 2024-01-18 NOTE — Progress Notes (Signed)
 Patient reports she does snore, STOP BANG 3, given her fatigue, headaches, elevated BP will pursue sleep apnea testing.

## 2024-01-18 NOTE — Assessment & Plan Note (Signed)
 Will work on weight loss and lifestyle modifications. I recommend consuming a heart healthy diet such as Mediterranean diet or DASH diet with whole grains, fruits, vegetable, fish, lean meats, nuts, and olive oil. Limit sweets and processed foods. I also encourage moderate intensity exercise 150 minutes weekly. This is 3-5 times weekly for 30-50 minutes each session. Goal should be pace of 3 miles/hours, or walking 1.5 miles in 30 minutes. The ASCVD Risk score (Arnett DK, et al., 2019) failed to calculate for the following reasons:   The 2019 ASCVD risk score is only valid for ages 22 to 12

## 2024-01-18 NOTE — Assessment & Plan Note (Signed)
 BMI 27.65 with associated hypertension and hyperlipidemia. I believe she would benefit greatly from weight loss and she has been unsuccessful with diet and exercise alone. Will start Ozempic 0.25mg  weekly for 4 weeks and increase as tolerated. Stressed importance of low calorie, high protein, heart healthy diet and moderate intensity exercise 150 minutes weekly. This is 3-5 times weekly for 30-50 minutes each session. Goal should be pace of 3 miles/hours, or walking 1.5 miles in 30 minutes and include strength training. Discussed risks of obesity.  Follow up in 4 weeks after starting medication.

## 2024-01-19 ENCOUNTER — Telehealth: Payer: Self-pay

## 2024-01-19 LAB — LIPID PANEL
Cholesterol: 198 mg/dL (ref <200)
HDL: 55 mg/dL (ref >=50)
LDL Cholesterol (Calc): 120 mg/dL — ABNORMAL HIGH
Non-HDL Cholesterol (Calc): 143 mg/dL — ABNORMAL HIGH (ref <130)
Total CHOL/HDL Ratio: 3.6 (calc) (ref <5.0)
Triglycerides: 119 mg/dL (ref <150)

## 2024-01-19 LAB — TEST AUTHORIZATION

## 2024-01-19 LAB — CBC WITH DIFFERENTIAL/PLATELET
Absolute Lymphocytes: 2287 {cells}/uL (ref 850–3900)
Absolute Monocytes: 442 {cells}/uL (ref 200–950)
Basophils Absolute: 51 {cells}/uL (ref 0–200)
Basophils Relative: 0.6 %
Eosinophils Absolute: 145 {cells}/uL (ref 15–500)
Eosinophils Relative: 1.7 %
HCT: 43.9 % (ref 35.0–45.0)
Hemoglobin: 14.6 g/dL (ref 11.7–15.5)
MCH: 29.8 pg (ref 27.0–33.0)
MCHC: 33.3 g/dL (ref 32.0–36.0)
MCV: 89.6 fL (ref 80.0–100.0)
MPV: 11.1 fL (ref 7.5–12.5)
Monocytes Relative: 5.2 %
Neutro Abs: 5576 {cells}/uL (ref 1500–7800)
Neutrophils Relative %: 65.6 %
Platelets: 248 10*3/uL (ref 140–400)
RBC: 4.9 10*6/uL (ref 3.80–5.10)
RDW: 12.9 % (ref 11.0–15.0)
Total Lymphocyte: 26.9 %
WBC: 8.5 10*3/uL (ref 3.8–10.8)

## 2024-01-19 LAB — COMPLETE METABOLIC PANEL WITHOUT GFR
AG Ratio: 1.8 (calc) (ref 1.0–2.5)
ALT: 9 U/L (ref 6–29)
AST: 14 U/L (ref 10–30)
Albumin: 4.5 g/dL (ref 3.6–5.1)
Alkaline phosphatase (APISO): 59 U/L (ref 31–125)
BUN: 14 mg/dL (ref 7–25)
CO2: 27 mmol/L (ref 20–32)
Calcium: 9.4 mg/dL (ref 8.6–10.2)
Chloride: 102 mmol/L (ref 98–110)
Creat: 0.73 mg/dL (ref 0.50–0.97)
Globulin: 2.5 g/dL (ref 1.9–3.7)
Glucose, Bld: 83 mg/dL (ref 65–99)
Potassium: 4.1 mmol/L (ref 3.5–5.3)
Sodium: 138 mmol/L (ref 135–146)
Total Bilirubin: 0.5 mg/dL (ref 0.2–1.2)
Total Protein: 7 g/dL (ref 6.1–8.1)

## 2024-01-19 LAB — VITAMIN B12: Vitamin B-12: 302 pg/mL (ref 200–1100)

## 2024-01-19 LAB — VITAMIN D 25 HYDROXY (VIT D DEFICIENCY, FRACTURES): Vit D, 25-Hydroxy: 28 ng/mL — ABNORMAL LOW (ref 30–100)

## 2024-01-19 LAB — TSH: TSH: 0.68 m[IU]/L

## 2024-01-19 NOTE — Telephone Encounter (Signed)
 Copied from CRM [id number]. Topic: Referral - Prior Authorization Question >> Jan 19, 2024  2:24 PM Gurney Maxin H wrote: Reason for CRM: Baird Lyons with [facility] prior authorziation is calling in regards to patients NURTEC 75 MG TBDP, states she has questions for the provider that will determine her review, wouldn't provider agent with informat   [facility] Prior Authorization [id number]

## 2024-02-01 ENCOUNTER — Encounter: Payer: Self-pay | Admitting: Family Medicine

## 2024-02-01 ENCOUNTER — Ambulatory Visit: Admitting: Family Medicine

## 2024-02-01 VITALS — BP 140/88 | HR 84 | Resp 16 | Ht 66.0 in | Wt 172.1 lb

## 2024-02-01 DIAGNOSIS — E663 Overweight: Secondary | ICD-10-CM

## 2024-02-01 DIAGNOSIS — G43111 Migraine with aura, intractable, with status migrainosus: Secondary | ICD-10-CM

## 2024-02-01 DIAGNOSIS — E782 Mixed hyperlipidemia: Secondary | ICD-10-CM | POA: Diagnosis not present

## 2024-02-01 DIAGNOSIS — I10 Essential (primary) hypertension: Secondary | ICD-10-CM | POA: Diagnosis not present

## 2024-02-01 MED ORDER — NURTEC 75 MG PO TBDP: 1.0000 | ORAL_TABLET | ORAL | 0 refills | Status: DC

## 2024-02-01 MED ORDER — WEGOVY 0.25 MG/0.5ML ~~LOC~~ SOAJ: SUBCUTANEOUS | 0 refills | Status: DC

## 2024-02-01 MED ORDER — HYDROCHLOROTHIAZIDE 12.5 MG PO TABS: 12.5000 mg | ORAL_TABLET | Freq: Every day | ORAL | 3 refills | Status: DC

## 2024-02-01 NOTE — Assessment & Plan Note (Signed)
 Has not been able to obtain Nurtec as covered by insurance. Is allergic to triptans. Has tried several medications in past. Nurtec is effective for her and she is taking 1-2 per month. Refill provided

## 2024-02-01 NOTE — Assessment & Plan Note (Signed)
 Continue Wegovy  and increase to 0.5mg  weekly as tolerated. Counseled on importance of weight management for overall health. Encouraged low calorie, heart healthy diet and moderate intensity exercise 150 minutes weekly. This is 3-5 times weekly for 30-50 minutes each session. Goal should be pace of 3 miles/hours, or walking 1.5 miles in 30 minutes and include strength training. Discussed risks of obesity.

## 2024-02-01 NOTE — Progress Notes (Signed)
 Subjective:  HPI: Kathy Aguilar is a 38 y.o. female presenting on 02/01/2024 for Hypertension (2 week follow up. Has been checking bp at home. Running in the 150s/100s)   Hypertension   Patient is in today for blood pressure follow up. Home readings have been 155/101, 144/97, 135/97, 134/105, 139/102, 150/104, 147/102, 147/97, 142/100, 146/104, 148/105, 150/108. Kathy Aguilar has also started Wegovy  and completed 2 weeks without side effects.   HYPERTENSION without Chronic Kidney Disease Hypertension status: uncontrolled  Satisfied with current treatment? no Duration of hypertension:  new diagnosis BP monitoring frequency:  daily BP range: 134/105-155/101 BP medication side effects:  no Medication compliance:  n/a Previous BP meds:none Aspirin: no Recurrent headaches: yes Visual changes: no Palpitations: no Dyspnea: no Chest pain: no Lower extremity edema: no Dizzy/lightheaded: no   Review of Systems  All other systems reviewed and are negative.   Relevant past medical history reviewed and updated as indicated.   Past Medical History:  Diagnosis Date   Asthma    Juvenille   Dysplastic nevus 07/20/2023   Left upper back - severe- needs excision   Family history of adverse reaction to anesthesia    mother has vomiting after anesthesia   Family history of breast cancer 03/18/2017   Mom at age 79. States mom had negative genetic testing.    Fibroadenoma of breast    History of gestational hypertension    History of pre-eclampsia    Migraine without aura      Past Surgical History:  Procedure Laterality Date   BILATERAL SALPINGECTOMY Right 08/17/2016   Procedure: RIGHT SALPINGECTOMY;  Surgeon: Lillian Rein, MD;  Location: [facility];  Service: Gynecology;  Laterality: Right;   BREAST BIOPSY Right 03/29/2023   MM RT BREAST BX W LOC DEV 1ST LESION IMAGE BX SPEC STEREO GUIDE 03/29/2023 [facility]   BREAST BIOPSY Left 03/29/2023   US  LT BREAST BX W LOC DEV 1ST  LESION IMG BX SPEC US  GUIDE 03/29/2023 [facility]   BREAST BIOPSY Left 03/29/2023   US  LT BREAST BX W LOC DEV EA ADD LESION IMG BX SPEC US  GUIDE 03/29/2023 [facility]   CESAREAN SECTION WITH BILATERAL TUBAL LIGATION Bilateral 02/19/2021   Procedure: CESAREAN SECTION WITH BILATERAL TUBAL LIGATION;  Surgeon: Raynell Caller, MD;  Location: [facility];  Service: Obstetrics;  Laterality: Bilateral;   CHROMOPERTUBATION Bilateral 08/17/2016   Procedure: CHROMOPERTUBATION;  Surgeon: Lillian Rein, MD;  Location: [facility];  Service: Gynecology;  Laterality: Bilateral;   LAPAROSCOPIC APPENDECTOMY N/A 01/14/2016   Procedure: APPENDECTOMY LAPAROSCOPIC;  Surgeon: Claudia Cuff, MD;  Location: [facility];  Service: General;  Laterality: N/A;   LAPAROSCOPIC LYSIS OF ADHESIONS  08/17/2016   Procedure: LAPAROSCOPIC LYSIS OF ADHESIONS;  Surgeon: Lillian Rein, MD;  Location: [facility];  Service: Gynecology;;   LAPAROSCOPY N/A 08/17/2016   Procedure: LAPAROSCOPY DIAGNOSTIC with left paratubal cystectomy;  Surgeon: Lillian Rein, MD;  Location: [facility];  Service: Gynecology;  Laterality: N/A;   SALPINGECTOMY Right    SURGICAL ENTRY ABOVE IS INCORRECT    Allergies and medications reviewed and updated.   Current Outpatient Medications:    FLUoxetine  (PROZAC ) 10 MG capsule, Take 1 capsule (10 mg total) by mouth daily., Disp: 90 capsule, Rfl: 3   hydrochlorothiazide  (HYDRODIURIL ) 12.5 MG tablet, Take 1 tablet (12.5 mg total) by mouth daily., Disp: 90 tablet, Rfl: 3   Rimegepant Sulfate (NURTEC) 75 MG TBDP, Take 1 tablet (75 mg total) by mouth every  other day., Disp: 18 tablet, Rfl: 0   WEGOVY  0.25 MG/0.5ML SOAJ, Inject 0.25 mg into the skin once a week for 21 days, THEN 0.5 mg once a week for 28 days. Use this dose for 1 month (4 shots) and then increase to next higher dose.., Disp: 2 mL, Rfl: 0  Allergies  Allergen Reactions   Sumatriptan  Rash    Objective:   BP (!) 140/88   Pulse 84   Resp 16    Ht 5\' 6"  (1.676 m)   Wt 172 lb 1 oz (78 kg)   LMP 12/25/2023 (Approximate)   SpO2 98%   BMI 27.77 kg/m      02/01/2024   11:56 AM 02/01/2024   11:48 AM 01/18/2024    8:42 AM  Vitals with BMI  Height  5\' 6"    Weight  172 lbs 1 oz   BMI  27.78   Systolic 140 146 161  Diastolic 88 96 98  Pulse  84      Physical Exam Vitals and nursing note reviewed.  Constitutional:      Appearance: Normal appearance. She is normal weight.  HENT:     Head: Normocephalic and atraumatic.  Cardiovascular:     Rate and Rhythm: Normal rate and regular rhythm.     Pulses: Normal pulses.     Heart sounds: Normal heart sounds.  Pulmonary:     Effort: Pulmonary effort is normal.     Breath sounds: Normal breath sounds.  Skin:    General: Skin is warm and dry.  Neurological:     General: No focal deficit present.     Mental Status: She is alert and oriented to person, place, and time. Mental status is at baseline.  Psychiatric:        Mood and Affect: Mood normal.        Behavior: Behavior normal.        Thought Content: Thought content normal.        Judgment: Judgment normal.     Assessment & Plan:  Primary hypertension Assessment & Plan: Uncontrolled. Start hydrochlorothiazide  12.5mg  daily. Recommend heart healthy diet such as Mediterranean diet with whole grains, fruits, vegetable, fish, lean meats, nuts, and olive oil. Limit salt. Encouraged moderate walking, 3-5 times/week for 30-50 minutes each session. Aim for at least 150 minutes.week. Goal should be pace of 3 miles/hours, or walking 1.5 miles in 30 minutes. Avoid tobacco products. Avoid excess alcohol. Take medications as prescribed and bring medications and blood pressure log with cuff to each office visit. Seek medical care for chest pain, palpitations, shortness of breath with exertion, dizziness/lightheadedness, vision changes, recurrent headaches, or swelling of extremities. Return to office in 2-4 weeks.    Overweight (BMI  25.0-29.9) Assessment & Plan: Continue Wegovy  and increase to 0.5mg  weekly as tolerated. Counseled on importance of weight management for overall health. Encouraged low calorie, heart healthy diet and moderate intensity exercise 150 minutes weekly. This is 3-5 times weekly for 30-50 minutes each session. Goal should be pace of 3 miles/hours, or walking 1.5 miles in 30 minutes and include strength training. Discussed risks of obesity.   Moderate mixed hyperlipidemia not requiring statin therapy Assessment & Plan: I recommend consuming a heart healthy diet such as Mediterranean diet or DASH diet with whole grains, fruits, vegetable, fish, lean meats, nuts, and olive oil. Limit sweets and processed foods. I also encourage moderate intensity exercise 150 minutes weekly. This is 3-5 times weekly for 30-50 minutes each session. Goal  should be pace of 3 miles/hours, or walking 1.5 miles in 30 minutes. The ASCVD Risk score (Arnett DK, et al., 2019) failed to calculate for the following reasons:   The 2019 ASCVD risk score is only valid for ages 50 to 72    Intractable migraine with aura with status migrainosus Assessment & Plan: Has not been able to obtain Nurtec as covered by insurance. Is allergic to triptans. Has tried several medications in past. Nurtec is effective for her and she is taking 1-2 per month. Refill provided   Other orders -     hydroCHLOROthiazide ; Take 1 tablet (12.5 mg total) by mouth daily.  Dispense: 90 tablet; Refill: 3 -     Wegovy ; Inject 0.25 mg into the skin once a week for 21 days, THEN 0.5 mg once a week for 28 days. Use this dose for 1 month (4 shots) and then increase to next higher dose.Aaron Aas  Dispense: 2 mL; Refill: 0 -     Nurtec; Take 1 tablet (75 mg total) by mouth every other day.  Dispense: 18 tablet; Refill: 0     Follow up plan: Return in about 3 weeks (around 02/22/2024) for hypertension.  Jenelle Mis, FNP

## 2024-02-01 NOTE — Assessment & Plan Note (Signed)
 I recommend consuming a heart healthy diet such as Mediterranean diet or DASH diet with whole grains, fruits, vegetable, fish, lean meats, nuts, and olive oil. Limit sweets and processed foods. I also encourage moderate intensity exercise 150 minutes weekly. This is 3-5 times weekly for 30-50 minutes each session. Goal should be pace of 3 miles/hours, or walking 1.5 miles in 30 minutes. The ASCVD Risk score (Arnett DK, et al., 2019) failed to calculate for the following reasons:   The 2019 ASCVD risk score is only valid for ages 71 to 53

## 2024-02-01 NOTE — Assessment & Plan Note (Signed)
 Uncontrolled. Start hydrochlorothiazide  12.5mg  daily. Recommend heart healthy diet such as Mediterranean diet with whole grains, fruits, vegetable, fish, lean meats, nuts, and olive oil. Limit salt. Encouraged moderate walking, 3-5 times/week for 30-50 minutes each session. Aim for at least 150 minutes.week. Goal should be pace of 3 miles/hours, or walking 1.5 miles in 30 minutes. Avoid tobacco products. Avoid excess alcohol. Take medications as prescribed and bring medications and blood pressure log with cuff to each office visit. Seek medical care for chest pain, palpitations, shortness of breath with exertion, dizziness/lightheadedness, vision changes, recurrent headaches, or swelling of extremities. Return to office in 2-4 weeks.

## 2024-02-08 NOTE — Telephone Encounter (Signed)
 Phylisha Musto (Key: [id number])  Your information has been sent to [facility].

## 2024-02-13 ENCOUNTER — Telehealth: Payer: Self-pay

## 2024-02-13 NOTE — Telephone Encounter (Signed)
 Fax received from [facility]. Pt's insurance has denied the PA for Nurtec 75 mg. Thanks.

## 2024-02-15 ENCOUNTER — Ambulatory Visit: Admitting: Family Medicine

## 2024-02-15 ENCOUNTER — Encounter: Payer: Self-pay | Admitting: Family Medicine

## 2024-02-15 VITALS — BP 120/82 | HR 79 | Ht 66.0 in | Wt 170.0 lb

## 2024-02-15 DIAGNOSIS — I10 Essential (primary) hypertension: Secondary | ICD-10-CM

## 2024-02-15 DIAGNOSIS — E663 Overweight: Secondary | ICD-10-CM

## 2024-02-15 MED ORDER — WEGOVY 0.5 MG/0.5ML ~~LOC~~ SOAJ: 0.5000 mg | SUBCUTANEOUS | 0 refills | Status: DC

## 2024-02-15 NOTE — Telephone Encounter (Signed)
 Appeal sent to [facility] documenting pt's allergy to Sumatriptan . Mjp,lpn

## 2024-02-15 NOTE — Assessment & Plan Note (Signed)
 BP improved. Continue hydrochlorothiazide  12.5mg  daily. Recommend heart healthy diet such as Mediterranean diet with whole grains, fruits, vegetable, fish, lean meats, nuts, and olive oil. Limit salt. Encouraged moderate walking, 3-5 times/week for 30-50 minutes each session. Aim for at least 150 minutes.week. Goal should be pace of 3 miles/hours, or walking 1.5 miles in 30 minutes. Avoid tobacco products. Avoid excess alcohol. Take medications as prescribed and bring medications and blood pressure log with cuff to each office visit. Seek medical care for chest pain, palpitations, shortness of breath with exertion, dizziness/lightheadedness, vision changes, recurrent headaches, or swelling of extremities. Follow up in 1 month.

## 2024-02-15 NOTE — Assessment & Plan Note (Signed)
 Tolerating Ozempic  0.25mg  weekly. Will increase to 0.5mg  weekly when she picks up rx. BMI 27.44. Counseled on importance of weight management for overall health. Encouraged low calorie, heart healthy diet and moderate intensity exercise 150 minutes weekly. This is 3-5 times weekly for 30-50 minutes each session. Goal should be pace of 3 miles/hours, or walking 1.5 miles in 30 minutes and include strength training.

## 2024-02-15 NOTE — Progress Notes (Signed)
 Subjective:  HPI: Kathy Aguilar is a 38 y.o. female presenting on 02/15/2024 for Hypertension   Hypertension   Patient is in today for blood pressure and weight management follow up.   HYPERTENSION without Chronic Kidney Disease Hypertension status: better  Satisfied with current treatment? yes Duration of hypertension: chronic BP monitoring frequency:  a few times a week BP range: 130s/90  BP medication side effects:  no Medication compliance: excellent compliance Previous BP meds: hydrochlorothiazide  Aspirin: no Recurrent headaches: no Visual changes: no Palpitations: no Dyspnea: no Chest pain: no Lower extremity edema: no Dizzy/lightheaded: no   Review of Systems  All other systems reviewed and are negative.   Relevant past medical history reviewed and updated as indicated.   Past Medical History:  Diagnosis Date   Asthma    Juvenille   Dysplastic nevus 07/20/2023   Left upper back - severe- needs excision   Family history of adverse reaction to anesthesia    mother has vomiting after anesthesia   Family history of breast cancer 03/18/2017   Mom at age 53. States mom had negative genetic testing.    Fibroadenoma of breast    History of gestational hypertension    History of pre-eclampsia    Migraine without aura      Past Surgical History:  Procedure Laterality Date   BILATERAL SALPINGECTOMY Right 08/17/2016   Procedure: RIGHT SALPINGECTOMY;  Surgeon: Lillian Rein, MD;  Location: [facility];  Service: Gynecology;  Laterality: Right;   BREAST BIOPSY Right 03/29/2023   MM RT BREAST BX W LOC DEV 1ST LESION IMAGE BX SPEC STEREO GUIDE 03/29/2023 [facility]   BREAST BIOPSY Left 03/29/2023   US  LT BREAST BX W LOC DEV 1ST LESION IMG BX SPEC US  GUIDE 03/29/2023 [facility]   BREAST BIOPSY Left 03/29/2023   US  LT BREAST BX W LOC DEV EA ADD LESION IMG BX SPEC US  GUIDE 03/29/2023 [facility]   CESAREAN SECTION WITH BILATERAL TUBAL LIGATION  Bilateral 02/19/2021   Procedure: CESAREAN SECTION WITH BILATERAL TUBAL LIGATION;  Surgeon: Raynell Caller, MD;  Location: [facility];  Service: Obstetrics;  Laterality: Bilateral;   CHROMOPERTUBATION Bilateral 08/17/2016   Procedure: CHROMOPERTUBATION;  Surgeon: Lillian Rein, MD;  Location: [facility];  Service: Gynecology;  Laterality: Bilateral;   LAPAROSCOPIC APPENDECTOMY N/A 01/14/2016   Procedure: APPENDECTOMY LAPAROSCOPIC;  Surgeon: Claudia Cuff, MD;  Location: [facility];  Service: General;  Laterality: N/A;   LAPAROSCOPIC LYSIS OF ADHESIONS  08/17/2016   Procedure: LAPAROSCOPIC LYSIS OF ADHESIONS;  Surgeon: Lillian Rein, MD;  Location: [facility];  Service: Gynecology;;   LAPAROSCOPY N/A 08/17/2016   Procedure: LAPAROSCOPY DIAGNOSTIC with left paratubal cystectomy;  Surgeon: Lillian Rein, MD;  Location: [facility];  Service: Gynecology;  Laterality: N/A;   SALPINGECTOMY Right    SURGICAL ENTRY ABOVE IS INCORRECT    Allergies and medications reviewed and updated.   Current Outpatient Medications:    FLUoxetine  (PROZAC ) 10 MG capsule, Take 1 capsule (10 mg total) by mouth daily., Disp: 90 capsule, Rfl: 3   hydrochlorothiazide  (HYDRODIURIL ) 12.5 MG tablet, Take 1 tablet (12.5 mg total) by mouth daily., Disp: 90 tablet, Rfl: 3   Rimegepant Sulfate (NURTEC) 75 MG TBDP, Take 1 tablet (75 mg total) by mouth every other day., Disp: 18 tablet, Rfl: 0   WEGOVY  0.5 MG/0.5ML SOAJ, Inject 0.5 mg into the skin once a week. Use this dose for 1 month (4 shots) and then increase to next higher  dose., Disp: 2 mL, Rfl: 0  Allergies  Allergen Reactions   Sumatriptan  Rash    Objective:   BP 120/82   Pulse 79   Ht 5\' 6"  (1.676 m)   Wt 170 lb (77.1 kg)   LMP 12/25/2023 (Approximate)   SpO2 99%   BMI 27.44 kg/m      02/15/2024   12:03 PM 02/01/2024   11:56 AM 02/01/2024   11:48 AM  Vitals with BMI  Height 5\' 6"   5\' 6"   Weight 170 lbs  172 lbs 1 oz  BMI 27.45  27.78  Systolic 120 140 962   Diastolic 82 88 96  Pulse 79  84     Physical Exam Vitals and nursing note reviewed.  Constitutional:      Appearance: Normal appearance. She is normal weight.  HENT:     Head: Normocephalic and atraumatic.  Cardiovascular:     Rate and Rhythm: Normal rate and regular rhythm.     Pulses: Normal pulses.     Heart sounds: Normal heart sounds.  Pulmonary:     Effort: Pulmonary effort is normal.     Breath sounds: Normal breath sounds.  Skin:    General: Skin is warm and dry.  Neurological:     General: No focal deficit present.     Mental Status: She is alert and oriented to person, place, and time. Mental status is at baseline.  Psychiatric:        Mood and Affect: Mood normal.        Behavior: Behavior normal.        Thought Content: Thought content normal.        Judgment: Judgment normal.     Assessment & Plan:  Primary hypertension Assessment & Plan: BP improved. Continue hydrochlorothiazide  12.5mg  daily. Recommend heart healthy diet such as Mediterranean diet with whole grains, fruits, vegetable, fish, lean meats, nuts, and olive oil. Limit salt. Encouraged moderate walking, 3-5 times/week for 30-50 minutes each session. Aim for at least 150 minutes.week. Goal should be pace of 3 miles/hours, or walking 1.5 miles in 30 minutes. Avoid tobacco products. Avoid excess alcohol. Take medications as prescribed and bring medications and blood pressure log with cuff to each office visit. Seek medical care for chest pain, palpitations, shortness of breath with exertion, dizziness/lightheadedness, vision changes, recurrent headaches, or swelling of extremities. Follow up in 1 month.   Overweight (BMI 25.0-29.9) Assessment & Plan: Tolerating Ozempic  0.25mg  weekly. Will increase to 0.5mg  weekly when she picks up rx. BMI 27.44. Counseled on importance of weight management for overall health. Encouraged low calorie, heart healthy diet and moderate intensity exercise 150 minutes weekly.  This is 3-5 times weekly for 30-50 minutes each session. Goal should be pace of 3 miles/hours, or walking 1.5 miles in 30 minutes and include strength training.    Other orders -     Wegovy ; Inject 0.5 mg into the skin once a week. Use this dose for 1 month (4 shots) and then increase to next higher dose.  Dispense: 2 mL; Refill: 0     Follow up plan: Return in about 4 weeks (around 03/14/2024) for weight management, hypertension.  Jenelle Mis, FNP

## 2024-02-16 ENCOUNTER — Encounter: Payer: Self-pay | Admitting: Neurology

## 2024-02-16 ENCOUNTER — Ambulatory Visit: Admitting: Neurology

## 2024-02-16 VITALS — BP 143/91 | HR 81 | Ht 66.0 in | Wt 168.0 lb

## 2024-02-16 DIAGNOSIS — R0683 Snoring: Secondary | ICD-10-CM | POA: Diagnosis not present

## 2024-02-16 DIAGNOSIS — R519 Headache, unspecified: Secondary | ICD-10-CM

## 2024-02-16 DIAGNOSIS — Z9189 Other specified personal risk factors, not elsewhere classified: Secondary | ICD-10-CM | POA: Diagnosis not present

## 2024-02-16 DIAGNOSIS — E663 Overweight: Secondary | ICD-10-CM

## 2024-02-16 DIAGNOSIS — G4719 Other hypersomnia: Secondary | ICD-10-CM

## 2024-02-16 DIAGNOSIS — R0689 Other abnormalities of breathing: Secondary | ICD-10-CM

## 2024-02-16 NOTE — Progress Notes (Signed)
 Subjective:    Patient ID: Kathy Aguilar is a 38 y.o. female.  HPI    Kathy Fairy, MD, PhD [facility] [address], [address] [address] [place], [place] [address]  Dear Ennis Hart,  I saw your patient, Kathy Aguilar upon your kind request in my sleep clinic today for initial consultation of her sleep disorder, in particular, concern for underlying obstructive sleep apnea.  The patient is unaccompanied today.  As you know, Kathy Aguilar is a 38 year old female with an underlying medical history of hypertension, hyperlipidemia, anxiety, migraine headaches, asthma, history of preeclampsia, and overweight state, who reports snoring and excessive daytime somnolence, difficulty sleeping at night.  Her Epworth sleepiness score is 9 out of 24, fatigue severity score is 38 out of 63. I reviewed your office note from 01/18/2024.  She has no difficulty falling asleep but has had some difficulty staying asleep.  She does not take any medication to help her sleep.  She lives with her husband and 3 children, ages 21, 55 and 96.  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  Bedtime is generally around 8:30 PM.  She does watch TV in her bedroom and typically turns it off around 9:30 PM or it goes off on a sleep timer.  Her husband works nights.  She has 1 dog in the household and the dog typically sleeps on the bed with her.  Her rise time is around 6 AM.  She drinks no daily caffeine , occasional coffee.  She drinks alcohol rarely.  She is a non-smoker.  She denies nightly nocturia but has had occasional morning headaches.  She has a history of migraine headaches as well.  Her Past Medical History Is Significant For: Past Medical History:  Diagnosis Date   Asthma    Juvenille   Dysplastic nevus 07/20/2023   Left upper back - severe- needs excision   Family history of adverse reaction to anesthesia    mother has vomiting after anesthesia   Family history of breast cancer 03/18/2017   Mom at age 2.  States mom had negative genetic testing.    Fibroadenoma of breast    History of gestational hypertension    History of pre-eclampsia    Migraine without aura     Her Past Surgical History Is Significant For: Past Surgical History:  Procedure Laterality Date   BILATERAL SALPINGECTOMY Right 08/17/2016   Procedure: RIGHT SALPINGECTOMY;  Surgeon: Lillian Rein, MD;  Location: [facility];  Service: Gynecology;  Laterality: Right;   BREAST BIOPSY Right 03/29/2023   MM RT BREAST BX W LOC DEV 1ST LESION IMAGE BX SPEC STEREO GUIDE 03/29/2023 [facility]   BREAST BIOPSY Left 03/29/2023   US  LT BREAST BX W LOC DEV 1ST LESION IMG BX SPEC US  GUIDE 03/29/2023 [facility]   BREAST BIOPSY Left 03/29/2023   US  LT BREAST BX W LOC DEV EA ADD LESION IMG BX SPEC US  GUIDE 03/29/2023 [facility]   CESAREAN SECTION WITH BILATERAL TUBAL LIGATION Bilateral 02/19/2021   Procedure: CESAREAN SECTION WITH BILATERAL TUBAL LIGATION;  Surgeon: Raynell Caller, MD;  Location: [facility];  Service: Obstetrics;  Laterality: Bilateral;   CHROMOPERTUBATION Bilateral 08/17/2016   Procedure: CHROMOPERTUBATION;  Surgeon: Lillian Rein, MD;  Location: [facility];  Service: Gynecology;  Laterality: Bilateral;   LAPAROSCOPIC APPENDECTOMY N/A 01/14/2016   Procedure: APPENDECTOMY LAPAROSCOPIC;  Surgeon: Claudia Cuff, MD;  Location: [facility];  Service: General;  Laterality: N/A;   LAPAROSCOPIC LYSIS OF ADHESIONS  08/17/2016   Procedure: LAPAROSCOPIC LYSIS OF ADHESIONS;  Surgeon: Lillian Rein, MD;  Location: [facility];  Service: Gynecology;;   LAPAROSCOPY N/A 08/17/2016   Procedure: LAPAROSCOPY DIAGNOSTIC with left paratubal cystectomy;  Surgeon: Lillian Rein, MD;  Location: [facility];  Service: Gynecology;  Laterality: N/A;   MOLE REMOVAL Left 11/2023   SALPINGECTOMY Right    SURGICAL ENTRY ABOVE IS INCORRECT    Her Family History Is Significant For: Family History  Problem Relation Age of Onset   Breast cancer  Mother 64   Osteoarthritis Maternal Grandmother    Hypertension Maternal Grandmother    Cancer Maternal Grandmother        blood cancer?   Kidney disease Maternal Grandmother    Cancer Maternal Grandfather 76       prostate cancer   Kidney disease Paternal Grandmother     Her Social History Is Significant For: Social History   Socioeconomic History   Marital status: Married    Spouse name: Not on file   Number of children: Not on file   Years of education: Not on file   Highest education level: Not on file  Occupational History   Not on file  Tobacco Use   Smoking status: Never   Smokeless tobacco: Never  Vaping Use   Vaping status: Never Used  Substance and Sexual Activity   Alcohol use: No   Drug use: No   Sexual activity: Yes    Partners: Male    Birth control/protection: None  Other Topics Concern   Not on file  Social History Narrative   Not on file   Social Drivers of Health   Financial Resource Strain: Not on file  Food Insecurity: Not on file  Transportation Needs: Not on file  Physical Activity: Not on file  Stress: Not on file  Social Connections: Not on file    Her Allergies Are:  Allergies  Allergen Reactions   Sumatriptan  Rash  :   Her Current Medications Are:  Outpatient Encounter Medications as of 02/16/2024  Medication Sig   Cholecalciferol (D3) 25 MCG (1000 UT) capsule Take 1,000 Units by mouth daily.   FLUoxetine  (PROZAC ) 10 MG capsule Take 1 capsule (10 mg total) by mouth daily.   hydrochlorothiazide  (HYDRODIURIL ) 12.5 MG tablet Take 1 tablet (12.5 mg total) by mouth daily.   Rimegepant Sulfate (NURTEC) 75 MG TBDP Take 1 tablet (75 mg total) by mouth every other day.   WEGOVY  0.5 MG/0.5ML SOAJ Inject 0.5 mg into the skin once a week. Use this dose for 1 month (4 shots) and then increase to next higher dose.   No facility-administered encounter medications on file as of 02/16/2024.  :   Review of Systems:  Out of a complete 14 point  review of systems, all are reviewed and negative with the exception of these symptoms as listed below:  Review of Systems  Neurological:        Room 5 Pt is here Alone. Pt states that her husband told her she snores. Pt states that she wakes up a lot at night, sometimes to go to the bathroom, other time she is just awake. Pt states that sometimes she can go back to sleep and other times she can't go back to sleep at all. Pt states that she will wake up sometimes a feel like she is having a panic attack and has a hard time catching her breath. ESS 9 FSS 38    Objective:  Neurological Exam  Physical Exam Physical Examination:   Vitals:   02/16/24 0916  BP: (!) 143/91  Pulse: 81    General Examination: The patient is a very pleasant 38 y.o. female in no acute distress. She appears well-developed and well-nourished and well groomed.   HEENT: Normocephalic, atraumatic, pupils are equal, round and reactive to light, extraocular tracking is good without limitation to gaze excursion or nystagmus noted. Hearing is grossly intact. Face is symmetric with normal facial animation. Speech is clear with no dysarthria noted. There is no hypophonia. There is no lip, neck/head, jaw or voice tremor. Neck is supple with full range of passive and active motion. There are no carotid bruits on auscultation. Oropharynx exam reveals: mild mouth dryness, good dental hygiene and mild airway crowding, due to small airway entry, slightly elongated uvula, tonsils on the smaller side bilaterally, 1+ approximately.  Mallampati class II.  Neck circumference 13-1/2 inches, minimal overbite noted.  Tongue protrudes centrally and palate elevates symmetrically.  Chest: Clear to auscultation without wheezing, rhonchi or crackles noted.  Heart: S1+S2+0, regular and normal without murmurs, rubs or gallops noted.   Abdomen: Soft, non-tender and non-distended.  Extremities: There is no pitting edema in the distal lower  extremities bilaterally.   Skin: Warm and dry without trophic changes noted.   Musculoskeletal: exam reveals no obvious joint deformities.   Neurologically:  Mental status: The patient is awake, alert and oriented in all 4 spheres. Her immediate and remote memory, attention, language skills and fund of knowledge are appropriate. There is no evidence of aphasia, agnosia, apraxia or anomia. Speech is clear with normal prosody and enunciation. Thought process is linear. Mood is normal and affect is normal.  Cranial nerves II - XII are as described above under HEENT exam.  Motor exam: Normal bulk, strength and tone is noted. There is no obvious action or resting tremor.  Fine motor skills and coordination: grossly intact.  Cerebellar testing: No dysmetria or intention tremor. There is no truncal or gait ataxia.  Sensory exam: intact to light touch in the upper and lower extremities.  Gait, station and balance: She stands easily. No veering to one side is noted. No leaning to one side is noted. Posture is age-appropriate and stance is narrow based. Gait shows normal stride length and normal pace. No problems turning are noted.   Assessment and Plan:   In summary, Kathy Aguilar is a very pleasant 38 y.o.-year old female with an underlying medical history of hypertension, hyperlipidemia, anxiety, migraine headaches, asthma, history of preeclampsia, and overweight state, whose history and physical exam are concerning for sleep disordered breathing, particularly obstructive sleep apnea (OSA). A laboratory attended sleep study is typically considered "gold standard" for evaluation of sleep disordered breathing.   I had a long chat with the patient about my findings and the diagnosis of sleep apnea, particularly OSA, its prognosis and treatment options. We talked about medical/conservative treatments, surgical interventions and non-pharmacological approaches for symptom control. I explained, in particular,  the risks and ramifications of untreated moderate to severe OSA, especially with respect to developing cardiovascular disease down the road, including congestive heart failure (CHF), difficult to treat hypertension, cardiac arrhythmias (particularly A-fib), neurovascular complications including TIA, stroke and dementia. Even type 2 diabetes has, in part, been linked to untreated OSA. Symptoms of untreated OSA may include (but may not be limited to) daytime sleepiness, nocturia (i.e. frequent nighttime urination), memory problems, mood irritability and suboptimally controlled or worsening mood disorder such as depression and/or  anxiety, lack of energy, lack of motivation, physical discomfort, as well as recurrent headaches, especially morning or nocturnal headaches. We talked about the importance of maintaining a healthy lifestyle and striving for healthy weight. In addition, we talked about the importance of striving for and maintaining good sleep hygiene. I recommended a sleep study at this time. I outlined the differences between a laboratory attended sleep study which is considered more comprehensive and accurate over the option of a home sleep test (HST); the latter may lead to underestimation of sleep disordered breathing in some instances and does not help with diagnosing upper airway resistance syndrome and is not accurate enough to diagnose primary central sleep apnea typically. I outlined possible surgical and non-surgical treatment options of OSA, including the use of a positive airway pressure (PAP) device (i.e. CPAP, AutoPAP/APAP or BiPAP in certain circumstances), a custom-made dental device (aka oral appliance, which would require a referral to a specialist dentist or orthodontist typically, and is generally speaking not considered for patients with full dentures or edentulous state), upper airway surgical options, such as traditional UPPP (which is not considered a first-line treatment) or the  Inspire device (hypoglossal nerve stimulator, which would involve a referral for consultation with an ENT surgeon, after careful selection, following inclusion criteria - also not first-line treatment). I explained the PAP treatment option to the patient in detail, as this is generally considered first-line treatment.  The patient indicated that she would be willing to try PAP therapy, if the need arises. I explained the importance of being compliant with PAP treatment, not only for insurance purposes but primarily to improve patient's symptoms symptoms, and for the patient's long term health benefit, including to reduce Her cardiovascular risks longer-term.    We will pick up our discussion about the next steps and treatment options after testing.  We will keep her posted as to the test results by phone call and/or MyChart messaging where possible.  We will plan to follow-up in sleep clinic accordingly as well.  I answered all her questions today and the patient was in agreement.   I encouraged her to call with any interim questions, concerns, problems or updates or email us  through MyChart.  Generally speaking, sleep test authorizations may take up to 2 weeks, sometimes less, sometimes longer, the patient is encouraged to get in touch with us  if they do not hear back from the sleep lab staff directly within the next 2 weeks.  Thank you very much for allowing me to participate in the care of this nice patient. If I can be of any further assistance to you please do not hesitate to call me at [phone].  Sincerely,   Kathy Fairy, MD, PhD

## 2024-02-16 NOTE — Patient Instructions (Signed)

## 2024-02-20 ENCOUNTER — Telehealth: Payer: Self-pay | Admitting: Neurology

## 2024-02-20 NOTE — Telephone Encounter (Signed)
 NPSG [employer] pending

## 2024-02-23 ENCOUNTER — Other Ambulatory Visit (HOSPITAL_COMMUNITY): Payer: Self-pay

## 2024-02-28 NOTE — Telephone Encounter (Signed)
 [employer] denied the NPSG.  [employer] HST no auth req.  NPSG denial reason.

## 2024-03-02 ENCOUNTER — Ambulatory Visit: Admitting: Neurology

## 2024-03-02 DIAGNOSIS — R0689 Other abnormalities of breathing: Secondary | ICD-10-CM

## 2024-03-02 DIAGNOSIS — Z9189 Other specified personal risk factors, not elsewhere classified: Secondary | ICD-10-CM

## 2024-03-02 DIAGNOSIS — R519 Headache, unspecified: Secondary | ICD-10-CM

## 2024-03-02 DIAGNOSIS — G4733 Obstructive sleep apnea (adult) (pediatric): Secondary | ICD-10-CM

## 2024-03-02 DIAGNOSIS — G4719 Other hypersomnia: Secondary | ICD-10-CM

## 2024-03-02 DIAGNOSIS — R0683 Snoring: Secondary | ICD-10-CM

## 2024-03-02 DIAGNOSIS — E663 Overweight: Secondary | ICD-10-CM

## 2024-03-06 ENCOUNTER — Telehealth: Payer: Self-pay

## 2024-03-06 NOTE — Telephone Encounter (Signed)
 Encounter made in error.

## 2024-03-13 ENCOUNTER — Other Ambulatory Visit: Payer: Self-pay | Admitting: Family Medicine

## 2024-03-13 MED ORDER — NURTEC 75 MG PO TBDP: 1.0000 | ORAL_TABLET | ORAL | 0 refills | Status: DC

## 2024-03-13 MED ORDER — WEGOVY 1 MG/0.5ML ~~LOC~~ SOAJ: 1.0000 mg | SUBCUTANEOUS | 0 refills | Status: DC

## 2024-03-14 ENCOUNTER — Telehealth: Payer: Self-pay | Admitting: Pharmacy Technician

## 2024-03-14 ENCOUNTER — Other Ambulatory Visit (HOSPITAL_COMMUNITY): Payer: Self-pay

## 2024-03-14 NOTE — Telephone Encounter (Signed)
 Pharmacy Patient Advocate Encounter   Received notification from CoverMyMeds that prior authorization for NURTEC 75 MG is required/requested.   Insurance verification completed.   The patient is insured through [facility] .   Per test claim: PA required; PA started via CoverMyMeds. KEY [id number] . Please see clinical question(s) below that I am not finding the answer to in their chart and advise.    Arlie Lain, SINCE PROVIDER HAS PRESCRIBED FOR PREVENTION HER PLAN MAY DENY UNTIL PT HAS TRIED AND FAILED 2 OF THE PREFFERED

## 2024-03-16 ENCOUNTER — Other Ambulatory Visit (HOSPITAL_COMMUNITY): Payer: Self-pay

## 2024-03-19 ENCOUNTER — Other Ambulatory Visit (HOSPITAL_COMMUNITY): Payer: Self-pay

## 2024-03-19 ENCOUNTER — Telehealth: Payer: Self-pay

## 2024-03-19 NOTE — Telephone Encounter (Signed)
 Good morning, no problem thanks for checking behind me I will submit the PA today!

## 2024-03-19 NOTE — Telephone Encounter (Signed)
 Pharmacy Patient Advocate Encounter   Received notification from CoverMyMeds that prior authorization for Nurtec 75MG  dispersible tablets is required/requested.   Insurance verification completed.   The patient is insured through [facility] .   Per test claim: PA required; PA submitted to above mentioned insurance via CoverMyMeds Key/confirmation #/EOC  [facility] Status is pending

## 2024-03-19 NOTE — Telephone Encounter (Signed)
 Pharmacy Patient Advocate Encounter  Received notification from [facility] that Prior Authorization for Nurtec 75MG  dispersible tablets has been CANCELLED due to     So it looks like someone from the office did the original PA for Nurtec and it was denied but there was info to appeal it. Unfortunately I can't send a new PA the 1st denial will have to be appealed, Our team only handle appeals that were denied from us  submitting the PA  PA #/Case ID/Reference #: [id number]

## 2024-03-19 NOTE — Telephone Encounter (Signed)
 Copied from CRM [id number]. Topic: Clinical - Medication Prior Auth >> Mar 19, 2024  9:30 AM Pennelope Bowler wrote: Reason for CRM: [employer] calling to check status of prior authorization of Nurtec 75mg . Prior auth. Request sent over last week Wednesday, insurance looking for update on PA. Spoke to [place] [phone] PA department

## 2024-03-28 ENCOUNTER — Encounter: Payer: Self-pay | Admitting: Family Medicine

## 2024-03-28 ENCOUNTER — Ambulatory Visit: Admitting: Family Medicine

## 2024-03-28 VITALS — BP 118/80 | HR 105 | Temp 98.3°F | Ht 66.0 in | Wt 162.6 lb

## 2024-03-28 DIAGNOSIS — E663 Overweight: Secondary | ICD-10-CM

## 2024-03-28 DIAGNOSIS — M5431 Sciatica, right side: Secondary | ICD-10-CM

## 2024-03-28 DIAGNOSIS — M5432 Sciatica, left side: Secondary | ICD-10-CM

## 2024-03-28 MED ORDER — WEGOVY 1 MG/0.5ML ~~LOC~~ SOAJ: 1.0000 mg | SUBCUTANEOUS | Status: AC

## 2024-03-28 MED ORDER — PREDNISONE 20 MG PO TABS
ORAL_TABLET | ORAL | 0 refills | Status: DC
Start: 2024-03-28 — End: 2024-06-17

## 2024-03-28 MED ORDER — WEGOVY 1.7 MG/0.75ML ~~LOC~~ SOAJ: 1.7000 mg | SUBCUTANEOUS | 0 refills | Status: DC

## 2024-03-28 NOTE — Assessment & Plan Note (Signed)
 BMI 26.24 from 27.87. Increase Wegovy  to 1.7mg  weekly. Continue low calories, high protein diet and moderate intensity exercise 150 minutes weekly. This is 3-5 times weekly for 30-50 minutes each session. Goal should be pace of 3 miles/hours, or walking 1.5 miles in 30 minutes.

## 2024-03-28 NOTE — Assessment & Plan Note (Signed)
 No red flags on exam. Start Prednisone  pack. Return to office if symptoms persist or worsen.

## 2024-03-28 NOTE — Progress Notes (Signed)
 Subjective:  HPI: Kathy Aguilar is a 38 y.o. female presenting on 03/28/2024 for Medical Management of Chronic Issues (1 mo f/u/Pain in hips and legs even after stretching )   HPI Patient is in today for weight management follow-up. Other concerns include bilateral low back to thigh pain unrelieved with NSAIDs and stretching. No identifiable precipitators. Has had similar sciatic pain in past relieved with pred pak. Denies saddle numbness, incontinence of urine or stool, lower extremity weakness, numbness, tingling. No trauma or fall.   WEIGHT MANAGEMENT Starting BMI: 27.87 Current BMI: 26.24 Goal weight: BMI <25 Diet: carb conscious, heart healthy, high protein Exercise: Sedentary Medication: wegovy  1mg  weekly Side Effects: none   Review of Systems  All other systems reviewed and are negative.   Relevant past medical history reviewed and updated as indicated.   Past Medical History:  Diagnosis Date   Asthma    [place]   Dysplastic nevus 07/20/2023   Left upper back - severe- needs excision   Family history of adverse reaction to anesthesia    mother has vomiting after anesthesia   Family history of breast cancer 03/18/2017   Mom at age 92. States mom had negative genetic testing.    Fibroadenoma of breast    History of gestational hypertension    History of pre-eclampsia    Migraine without aura      Past Surgical History:  Procedure Laterality Date   BILATERAL SALPINGECTOMY Right 08/17/2016   Procedure: RIGHT SALPINGECTOMY;  Surgeon: Lillian Rein, MD;  Location: [facility];  Service: Gynecology;  Laterality: Right;   BREAST BIOPSY Right 03/29/2023   MM RT BREAST BX W LOC DEV 1ST LESION IMAGE BX SPEC STEREO GUIDE 03/29/2023 [facility]   BREAST BIOPSY Left 03/29/2023   US  LT BREAST BX W LOC DEV 1ST LESION IMG BX SPEC US  GUIDE 03/29/2023 [facility]   BREAST BIOPSY Left 03/29/2023   US  LT BREAST BX W LOC DEV EA ADD LESION IMG BX SPEC US  GUIDE 03/29/2023  [facility]   CESAREAN SECTION WITH BILATERAL TUBAL LIGATION Bilateral 02/19/2021   Procedure: CESAREAN SECTION WITH BILATERAL TUBAL LIGATION;  Surgeon: Raynell Caller, MD;  Location: [facility];  Service: Obstetrics;  Laterality: Bilateral;   CHROMOPERTUBATION Bilateral 08/17/2016   Procedure: CHROMOPERTUBATION;  Surgeon: Lillian Rein, MD;  Location: [facility];  Service: Gynecology;  Laterality: Bilateral;   LAPAROSCOPIC APPENDECTOMY N/A 01/14/2016   Procedure: APPENDECTOMY LAPAROSCOPIC;  Surgeon: Claudia Cuff, MD;  Location: [facility];  Service: General;  Laterality: N/A;   LAPAROSCOPIC LYSIS OF ADHESIONS  08/17/2016   Procedure: LAPAROSCOPIC LYSIS OF ADHESIONS;  Surgeon: Lillian Rein, MD;  Location: [facility];  Service: Gynecology;;   LAPAROSCOPY N/A 08/17/2016   Procedure: LAPAROSCOPY DIAGNOSTIC with left paratubal cystectomy;  Surgeon: Lillian Rein, MD;  Location: [facility];  Service: Gynecology;  Laterality: N/A;   MOLE REMOVAL Left 11/2023   SALPINGECTOMY Right    SURGICAL ENTRY ABOVE IS INCORRECT    Allergies and medications reviewed and updated.   Current Outpatient Medications:    Cholecalciferol (D3) 25 MCG (1000 UT) capsule, Take 1,000 Units by mouth daily., Disp: , Rfl:    FLUoxetine  (PROZAC ) 10 MG capsule, Take 1 capsule (10 mg total) by mouth daily., Disp: 90 capsule, Rfl: 3   hydrochlorothiazide  (HYDRODIURIL ) 12.5 MG tablet, Take 1 tablet (12.5 mg total) by mouth daily., Disp: 90 tablet, Rfl: 3   predniSONE  (DELTASONE ) 20 MG tablet, 3 tabs poqday 1-2, 2  tabs poqday 3-4, 1 tab poqday 5-6, Disp: 12 tablet, Rfl: 0   Rimegepant Sulfate (NURTEC) 75 MG TBDP, Take 1 tablet (75 mg total) by mouth every other day., Disp: 13 tablet, Rfl: 0   [START ON 04/09/2024] WEGOVY  1.7 MG/0.75ML SOAJ, Inject 1.7 mg into the skin once a week. Use this dose for 1 month (4 shots) and then increase to next higher dose., Disp: 3 mL, Rfl: 0   WEGOVY  1 MG/0.5ML SOAJ, Inject 1 mg into the skin once a  week for 11 days. Use this dose for 1 month (4 shots) and then increase to next higher dose., Disp: , Rfl:   Allergies  Allergen Reactions   Sumatriptan  Rash    Objective:   BP 118/80   Pulse (!) 105   Temp 98.3 F (36.8 C)   Ht 5' 6 (1.676 m)   Wt 162 lb 9.6 oz (73.8 kg)   LMP 03/16/2024   SpO2 99%   BMI 26.24 kg/m      03/28/2024    9:14 AM 02/16/2024    9:16 AM 02/15/2024   12:03 PM  Vitals with BMI  Height 5' 6 5' 6 5' 6  Weight 162 lbs 10 oz 168 lbs 170 lbs  BMI 26.26 27.13 27.45  Systolic 118 143 469  Diastolic 80 91 82  Pulse 105 81 79     Physical Exam Vitals and nursing note reviewed.  Constitutional:      Appearance: Normal appearance. She is normal weight.  HENT:     Head: Normocephalic and atraumatic.   Musculoskeletal:     Lumbar back: Normal.   Skin:    General: Skin is warm and dry.   Neurological:     General: No focal deficit present.     Mental Status: She is alert and oriented to person, place, and time. Mental status is at baseline.   Psychiatric:        Mood and Affect: Mood normal.        Behavior: Behavior normal.        Thought Content: Thought content normal.        Judgment: Judgment normal.     Assessment & Plan:  Overweight (BMI 25.0-29.9) Assessment & Plan: BMI 26.24 from 27.87. Increase Wegovy  to 1.7mg  weekly. Continue low calories, high protein diet and moderate intensity exercise 150 minutes weekly. This is 3-5 times weekly for 30-50 minutes each session. Goal should be pace of 3 miles/hours, or walking 1.5 miles in 30 minutes.   Bilateral sciatica Assessment & Plan: No red flags on exam. Start Prednisone  pack. Return to office if symptoms persist or worsen.   Other orders -     Wegovy ; Inject 1 mg into the skin once a week for 11 days. Use this dose for 1 month (4 shots) and then increase to next higher dose. -     Wegovy ; Inject 1.7 mg into the skin once a week. Use this dose for 1 month (4 shots) and then  increase to next higher dose.  Dispense: 3 mL; Refill: 0 -     predniSONE ; 3 tabs poqday 1-2, 2 tabs poqday 3-4, 1 tab poqday 5-6  Dispense: 12 tablet; Refill: 0     Follow up plan: Return in about 3 months (around 06/28/2024) for weight management.  Jenelle Mis, FNP

## 2024-03-29 NOTE — Progress Notes (Signed)
 See procedure note.

## 2024-03-30 ENCOUNTER — Ambulatory Visit: Payer: Self-pay | Admitting: Neurology

## 2024-03-30 DIAGNOSIS — G4733 Obstructive sleep apnea (adult) (pediatric): Secondary | ICD-10-CM

## 2024-03-30 NOTE — Procedures (Signed)
 [facility]  HOME SLEEP TEST (SANSA) REPORT (Mail-Out Device):   STUDY DATE: 03/19/2024  DOB: [birth year]  MRN: [id number]  ORDERING CLINICIAN: Debbra Fairy, MD, PhD   REFERRING CLINICIAN: Jenelle Mis, FNP   CLINICAL INFORMATION/HISTORY: 38 year old female with an underlying medical history of hypertension, hyperlipidemia, anxiety, migraine headaches, asthma, history of preeclampsia, and overweight state, who reports snoring and excessive daytime somnolence, difficulty sleeping at night.   PATIENT'S LAST REPORTED EPWORTH SLEEPINESS SCORE (ESS): 9/24.  BMI (at the time of sleep clinic visit and/or test date): 27.1 kg/m  FINDINGS:   Study Protocol:    The SANSA single-point-of-skin-contact chest-worn sensor - an FDA cleared and DOT approved type 4 home sleep test device - measures eight physiological channels,  including blood oxygen saturation (measured via PPG [photoplethysmography]), EKG-derived heart rate, respiratory effort, chest movement (measured via accelerometer), snoring, body position, and actigraphy. The device is designed to be worn for up to 10 hours per study.   Sleep Summary:   Total Recording Time (hours, min): 9 hours, 38 min  Total Effective Sleep Time (hours, min):  7 hours, 2 min  Sleep Efficiency (%):    77%   Respiratory Indices:   Calculated sAHI (per hour):  6.4/hour         Oxygen Saturation Statistics:    Oxygen Saturation (%) Mean: 96.7%   Minimum oxygen saturation (%):                 83.9%   O2 Saturation Range (%): 83.9 -100%   Time below or at 88% saturation: 0 min   Pulse Rate Statistics:   Pulse Mean (bpm):    80/min    Pulse Range (64-129/min)   Snoring: Mild, intermittent  IMPRESSION/DIAGNOSES:   OSA (obstructive sleep apnea), mild   RECOMMENDATIONS:   This home sleep test demonstrates overall mild obstructive sleep apnea with a total AHI of 6.4/hour and O2 nadir of 83.9%. Snoring was detected,  intermittently, in the mild range. Given the patient's medical history and sleep related complaints, therapy with a positive airway pressure device is recommended. Treatment can be achieved in the form of autoPAP trial/titration at home for now. A full night, in-lab PAP titration study may aid in improving proper treatment settings and with mask fit, if needed, down the road. Alternative treatments may include weight loss (where appropriate) along with avoidance of the supine sleep position (if possible), or an oral appliance in appropriate candidates.   Please note that untreated obstructive sleep apnea may carry additional perioperative morbidity. Patients with significant obstructive sleep apnea should receive perioperative PAP therapy and the surgeons and particularly the anesthesiologist should be informed of the diagnosis and the severity of the sleep disordered breathing. The patient should be cautioned not to drive, work at heights, or operate dangerous or heavy equipment when tired or sleepy. Review and reiteration of good sleep hygiene measures should be pursued with any patient. Other causes of the patient's symptoms, including circadian rhythm disturbances, an underlying mood disorder, medication effect and/or an underlying medical problem cannot be ruled out based on this test. Clinical correlation is recommended.  The patient and her referring provider will be notified of the test results. The patient will be seen in follow up in sleep clinic at [facility], as necessary.  I certify that I have reviewed the raw data recording prior to the issuance of this report in accordance with the standards of the American Academy of Sleep Medicine (AASM).  INTERPRETING PHYSICIAN:   Debbra Fairy, MD, PhD Medical Director, [facility]) Diplomat, ABPN (Neurology and Sleep)   [facility] [address], [address] [place], [place] [address] [phone]

## 2024-04-05 ENCOUNTER — Other Ambulatory Visit (HOSPITAL_COMMUNITY): Payer: Self-pay

## 2024-04-09 ENCOUNTER — Other Ambulatory Visit (HOSPITAL_COMMUNITY): Payer: Self-pay

## 2024-04-10 ENCOUNTER — Other Ambulatory Visit (HOSPITAL_COMMUNITY): Payer: Self-pay

## 2024-04-11 NOTE — Telephone Encounter (Signed)
 New, Maryella Shivers, Otilio Jefferson, RN; Alain Honey; Jeris Penta, [place]; 1 other Received, thank you!

## 2024-04-11 NOTE — Telephone Encounter (Signed)
-----   Message from True Mar sent at 03/30/2024  3:04 PM EDT ----- Patient referred by PCP, seen by me on 02/16/2024, patient had HST on 03/19/2024.    Please call and notify the patient that the recent home sleep test showed obstructive sleep apnea. OSA is overall mild, but worth treating to see if she feels better after treatment. To that end I  recommend treatment for this in the form of autoPAP, which means, that we don't have to bring her in for a sleep study with CPAP, but will let her try an autoPAP machine at home, through a DME  company (of her choice, or as per insurance requirement). The DME representative will educate her on how to use the machine, how to put the mask on, etc. I have placed an order in the chart. Please  send referral, talk to patient, send report to referring MD. We will need a FU in sleep clinic in about 2.-3 months post-PAP set up (which is usually an insurance-mandated appointment to monitor  compliance), please arrange that with me or one of our NPs. Please also go over the need for compliance with treatment (including the insurance-imposed minimum compliance percentage). Thanks,   True Mar, MD, PhD [facility])    ----- Message ----- From: Mar True, MD Sent: 03/30/2024   3:02 PM EDT To: True Mar, MD

## 2024-04-11 NOTE — Telephone Encounter (Signed)
 Spoke with patient and discussed her sleep study results. She agreed to setup on autopap via [employer]. Patient will watch for a call within 1 week and will follow-up if needed. We discussed the insurance compliance requirements which includes using the machine at least 4 hours at night and also being seen by our office between 30 and 90 days after setup. Pt scheduled an appt for 9/23 at 1245 pm arrival 1215. Her questions were answered. She verbalized appreciation for the call.  Referral sent to [employer]. Sleep study results sent to referring provider.

## 2024-04-16 ENCOUNTER — Encounter: Payer: Self-pay | Admitting: Family Medicine

## 2024-04-16 NOTE — Telephone Encounter (Signed)
 Please see telephone encounter 03/14/24.

## 2024-04-17 ENCOUNTER — Other Ambulatory Visit: Payer: Self-pay

## 2024-04-17 ENCOUNTER — Telehealth: Payer: Self-pay | Admitting: Family Medicine

## 2024-04-17 DIAGNOSIS — G43111 Migraine with aura, intractable, with status migrainosus: Secondary | ICD-10-CM

## 2024-04-17 MED ORDER — NURTEC 75 MG PO TBDP: 1.0000 | ORAL_TABLET | ORAL | 0 refills | Status: DC

## 2024-04-17 NOTE — Telephone Encounter (Signed)
 Prescription Request  04/17/2024  LOV: 03/28/2024  What is the name of the medication or equipment? Rimegepant Sulfate (NURTEC) 75 MG TBDP   Have you contacted your pharmacy to request a refill? Yes   Which pharmacy would you like this sent to?  [facility] - [place], [place] - [address] [place] [address] Phone: [phone] Fax: [phone]    Patient notified that their request is being sent to the clinical staff for review and that they should receive a response within 2 business days.   Please advise at [facility] [phone]

## 2024-04-18 ENCOUNTER — Other Ambulatory Visit (HOSPITAL_COMMUNITY): Payer: Self-pay

## 2024-04-18 ENCOUNTER — Telehealth: Payer: Self-pay | Admitting: Pharmacy Technician

## 2024-04-18 NOTE — Telephone Encounter (Signed)
 Good morning, I just got another PA request for Nurtec for [employer] and unfortunately the PA will continue to get cancelled or denied until the requirements in her denial letter from April are met.  The detailed denial letter can be found in her media tab.

## 2024-04-24 NOTE — Telephone Encounter (Signed)
 You're welcome, you too!

## 2024-04-24 NOTE — Telephone Encounter (Signed)
 Good morning Kathy Aguilar, I'm sorry I understand how frustrating this is for the patient and our main goal is to take care of the patient.  In this case our team did not send in the original PA therefore whom ever sent that PA in would have to appeal the denial decision. I have tried to just submit an all new PA and her plan has cancelled that because of the denial.  Please check her media tab for the denial letter because it will detail the next steps as well as a time frame.

## 2024-04-25 NOTE — Telephone Encounter (Signed)
 Contacted [employer] care had appeal form process faxed to the office. Will file appeal pending physician review.

## 2024-04-26 ENCOUNTER — Encounter: Payer: Self-pay | Admitting: Family Medicine

## 2024-04-26 ENCOUNTER — Telehealth: Payer: Self-pay

## 2024-04-27 NOTE — Telephone Encounter (Signed)
 Pt contacted and per providers advise will try effexor xr as a trial prescription. If effexor does not alleviate symptoms we will move forward with the appeals process for Nrtec.

## 2024-04-30 ENCOUNTER — Other Ambulatory Visit: Payer: Self-pay | Admitting: Family Medicine

## 2024-04-30 ENCOUNTER — Encounter: Payer: Self-pay | Admitting: Family Medicine

## 2024-04-30 MED ORDER — TOPIRAMATE 25 MG PO TABS: 25.0000 mg | ORAL_TABLET | Freq: Every day | ORAL | 1 refills | Status: DC

## 2024-04-30 NOTE — Telephone Encounter (Signed)
 Amber, we have a dedicated RPH who handle all of our appeals and I did double check with her. Unfortunately since our team was not involved in submitting the initial prior authorization or the subsequent appeal, we are unable to take further action at this time.

## 2024-05-01 ENCOUNTER — Other Ambulatory Visit: Payer: Self-pay | Admitting: Family Medicine

## 2024-05-01 NOTE — Telephone Encounter (Signed)
 Prescription Request  05/01/2024  LOV: 03/28/2024  What is the name of the medication or equipment?   WEGOVY  2.4- new script requested  Have you contacted your pharmacy to request a refill? Yes   Which pharmacy would you like this sent to?  [id number] - [place], [place] - [address] [place] [address] Phone: [phone] Fax: [phone]    Patient notified that their request is being sent to the clinical staff for review and that they should receive a response within 2 business days.   Please advise pharmacist.

## 2024-05-03 MED ORDER — WEGOVY 1.7 MG/0.75ML ~~LOC~~ SOAJ: 1.7000 mg | SUBCUTANEOUS | 0 refills | Status: DC

## 2024-05-03 NOTE — Telephone Encounter (Signed)
 Requested medication (s) are due for refill today: yes  Requested medication (s) are on the active medication list: yes  Last refill:  03/28/24 3 ml  Future visit scheduled: yes  Notes to clinic:  pt due for next higher dose?   Requested Prescriptions  Pending Prescriptions Disp Refills   WEGOVY  1.7 MG/0.75ML SOAJ 3 mL 0    Sig: Inject 1.7 mg into the skin once a week. Use this dose for 1 month (4 shots) and then increase to next higher dose.     Endocrinology:  Diabetes - GLP-1 Receptor Agonists - semaglutide  Failed - 05/03/2024  2:33 PM      Failed - HBA1C in normal range and within 180 days    Hgb A1c MFr Bld  Date Value Ref Range Status  08/23/2016 5.1 4.6 - 6.5 % Final    Comment:    Glycemic Control Guidelines for People with Diabetes:Non Diabetic:  <6%Goal of Therapy: <7%Additional Action Suggested:  >8%          Passed - Cr in normal range and within 360 days    Creat  Date Value Ref Range Status  01/12/2024 0.73 0.50 - 0.97 mg/dL Final   Creatinine, Urine  Date Value Ref Range Status  02/19/2021 137.11 mg/dL Final         Passed - Valid encounter within last 6 months    Recent Outpatient Visits           1 month ago Overweight (BMI 25.0-29.9)   [facility] Kayla Jeoffrey RAMAN, FNP   2 months ago Primary hypertension   [place] [facility] Kayla Jeoffrey RAMAN, FNP   3 months ago Primary hypertension   [place] [facility] Kayla Jeoffrey S, FNP   3 months ago Overweight (BMI 25.0-29.9)   [place] [facility] Kayla Jeoffrey S, FNP   5 months ago Overweight (BMI 25.0-29.9)   [facility] [facility] Kayla Jeoffrey RAMAN, [place]

## 2024-05-03 NOTE — Telephone Encounter (Signed)
 Received eFax from pharmacy requesting new Rx for WEGOVY  2.4  Pharmacy:   [id number] - [place], [place] - [address] [place], [place] [address] Phone: [phone]  Fax: [phone] DEA #: [id number]  DAW Reason: --          Please advise pharmacist.

## 2024-05-11 ENCOUNTER — Other Ambulatory Visit (HOSPITAL_COMMUNITY): Payer: Self-pay

## 2024-05-11 NOTE — Telephone Encounter (Signed)
 Lvm for pt. To contact office to update pcp on effectiveness of topamax  to see if we need to move forward w/ PA or stay w/ current medication.

## 2024-05-21 ENCOUNTER — Telehealth: Payer: Self-pay | Admitting: Family Medicine

## 2024-05-21 NOTE — Telephone Encounter (Signed)
 Prescription Request  05/21/2024  LOV: 03/28/2024  What is the name of the medication or equipment?   WEGOVY  1.7 MG/0.75ML SOAJ  **Patient requesting new script for WEGOVY  2.4**  Have you contacted your pharmacy to request a refill? Yes   Which pharmacy would you like this sent to?  [id number] - [place], [place] - [address] [place] [address] Phone: [phone] Fax: [phone]    Patient notified that their request is being sent to the clinical staff for review and that they should receive a response within 2 business days.   Please advise pharmacist.

## 2024-05-21 NOTE — Telephone Encounter (Signed)
 Patient has messaged in stating that the Topamax  is not working and she would like to move forward with the appeal for Nurtec 75 mg.  I have contacted [employer] and [employer] they are both assisting with an appeal. You May need to addend notes at some point stating that the Topamax  is not working, However at this moment that is not a concern. I will update as things progress.

## 2024-05-22 ENCOUNTER — Telehealth: Payer: Self-pay

## 2024-05-23 ENCOUNTER — Encounter: Payer: Self-pay | Admitting: Family Medicine

## 2024-05-24 ENCOUNTER — Telehealth: Payer: Self-pay

## 2024-05-24 ENCOUNTER — Other Ambulatory Visit: Payer: Self-pay | Admitting: Family Medicine

## 2024-05-24 ENCOUNTER — Other Ambulatory Visit (HOSPITAL_COMMUNITY): Payer: Self-pay

## 2024-05-24 MED ORDER — WEGOVY 2.4 MG/0.75ML ~~LOC~~ SOAJ: 2.4000 mg | SUBCUTANEOUS | 11 refills | Status: AC

## 2024-05-24 NOTE — Telephone Encounter (Signed)
*  [facility]  Pharmacy Patient Advocate Encounter   Received notification from Fax that prior authorization for Wegovy  2.4MG /0.75ML auto-injectors   is required/requested.   Insurance verification completed.   The patient is insured through [facility] .   Per test claim: PA required; PA submitted to above mentioned insurance via Latent Key/confirmation #/EOC [id number] Status is pending

## 2024-05-24 NOTE — Telephone Encounter (Signed)
 Request Reference Number: [id number]. WEGOVY  INJ 2.4MG  is approved through 05/24/2025. Your patient may now fill this prescription and it will be covered.

## 2024-05-25 NOTE — Telephone Encounter (Signed)
 Prescriber sent in increased dosage.

## 2024-06-04 ENCOUNTER — Other Ambulatory Visit (HOSPITAL_COMMUNITY): Payer: Self-pay

## 2024-06-04 ENCOUNTER — Telehealth: Payer: Self-pay | Admitting: Pharmacy Technician

## 2024-06-04 NOTE — Telephone Encounter (Signed)
 Pharmacy Patient Advocate Encounter  Received notification from [facility] that Prior Authorization for Nurtec 75MG  dispersible tablets  has been APPROVED from 06/04/24 to 06/04/25. Ran test claim, Copay is $0.00. This test claim was processed through [facility]- copay amounts may vary at other pharmacies due to pharmacy/plan contracts, or as the patient moves through the different stages of their insurance plan.   PA #/Case ID/Reference #: [id number]    Plan covers 8 tablets per 30 days

## 2024-06-04 NOTE — Telephone Encounter (Signed)
 Pharmacy Patient Advocate Encounter   Received notification from [employer]eds that prior authorization for Nurtec 75MG  dispersible tablets  is required/requested.   Insurance verification completed.   The patient is insured through [facility] .   Per test claim: PA required; PA submitted to above mentioned insurance via Latent Key/confirmation #/EOC [id number] Status is pending

## 2024-06-13 ENCOUNTER — Other Ambulatory Visit: Payer: Self-pay | Admitting: Family Medicine

## 2024-06-17 ENCOUNTER — Telehealth: Admitting: Nurse Practitioner

## 2024-06-17 DIAGNOSIS — J019 Acute sinusitis, unspecified: Secondary | ICD-10-CM

## 2024-06-17 DIAGNOSIS — B9789 Other viral agents as the cause of diseases classified elsewhere: Secondary | ICD-10-CM | POA: Diagnosis not present

## 2024-06-17 MED ORDER — PREDNISONE 20 MG PO TABS: 20.0000 mg | ORAL_TABLET | Freq: Every day | ORAL | 0 refills | Status: AC

## 2024-06-17 MED ORDER — IPRATROPIUM BROMIDE 0.03 % NA SOLN: 2.0000 | Freq: Two times a day (BID) | NASAL | 0 refills | Status: DC

## 2024-06-17 NOTE — Progress Notes (Signed)
 Virtual Visit Consent   Kathy Aguilar, you are scheduled for a virtual visit with a [facility] provider today. Just as with appointments in the office, your consent must be obtained to participate. Your consent will be active for this visit and any virtual visit you may have with one of our providers in the next 365 days. If you have a MyChart account, a copy of this consent can be sent to you electronically.  As this is a virtual visit, video technology does not allow for your provider to perform a traditional examination. This may limit your provider's ability to fully assess your condition. If your provider identifies any concerns that need to be evaluated in person or the need to arrange testing (such as labs, EKG, etc.), we will make arrangements to do so. Although advances in technology are sophisticated, we cannot ensure that it will always work on either your end or our end. If the connection with a video visit is poor, the visit may have to be switched to a telephone visit. With either a video or telephone visit, we are not always able to ensure that we have a secure connection.  By engaging in this virtual visit, you consent to the provision of healthcare and authorize for your insurance to be billed (if applicable) for the services provided during this visit. Depending on your insurance coverage, you may receive a charge related to this service.  I need to obtain your verbal consent now. Are you willing to proceed with your visit today? Kathy Aguilar has provided verbal consent on 06/17/2024 for a virtual visit (video or telephone). Haze [name], NP  Date: 06/17/2024 3:32 PM   Virtual Visit via Video Note   I, Haze [name], connected with  Kathy Aguilar  ([id number], 04-21-1986) on 06/17/24 at  3:30 PM EDT by a video-enabled telemedicine application and verified that I am speaking with the correct person using two identifiers.  Location: Patient: Virtual Visit Location Patient:  Home Provider: Virtual Visit Location Provider: Home Office   I discussed the limitations of evaluation and management by telemedicine and the availability of in person appointments. The patient expressed understanding and agreed to proceed.    History of Present Illness: Kathy Aguilar is a 38 y.o. who identifies as a female who was assigned female at birth, and is being seen today for sinus congestion.   Kathy Aguilar woke up this morning with sore throat, sinus pain and pressure, frontal headache, dental pain and bilateral ear pressure.   Problems:  Patient Active Problem List   Diagnosis Date Noted   Bilateral sciatica 03/28/2024   Hypertension 01/18/2024   Moderate mixed hyperlipidemia not requiring statin therapy 01/18/2024   Generalized anxiety disorder 01/18/2024   Overweight (BMI 25.0-29.9) 11/07/2023   Viral URI 07/05/2023   Encounter to establish care with new doctor 01/17/2023   Intractable migraine with aura with status migrainosus 01/17/2023   History of gestational hypertension 01/06/2022   H/O unilateral salpingectomy- Right removed, Left [place]  12/19/2020   Headache disorder 02/16/2020    Allergies:  Allergies  Allergen Reactions   Sumatriptan  Rash   Medications:  Current Outpatient Medications:    ipratropium (ATROVENT ) 0.03 % nasal spray, Place 2 sprays into both nostrils every 12 (twelve) hours., Disp: 30 mL, Rfl: 0   Cholecalciferol (D3) 25 MCG (1000 UT) capsule, Take 1,000 Units by mouth daily., Disp: , Rfl:    FLUoxetine  (PROZAC ) 10 MG capsule, Take 1 capsule (10  mg total) by mouth daily., Disp: 90 capsule, Rfl: 3   hydrochlorothiazide  (HYDRODIURIL ) 12.5 MG tablet, Take 1 tablet (12.5 mg total) by mouth daily., Disp: 90 tablet, Rfl: 3   predniSONE  (DELTASONE ) 20 MG tablet, Take 1 tablet (20 mg total) by mouth daily with breakfast for 3 days., Disp: 3 tablet, Rfl: 0   Rimegepant Sulfate (NURTEC) 75 MG TBDP, Take 1 tablet (75 mg total) by mouth every other  day., Disp: 13 tablet, Rfl: 0   topiramate  (TOPAMAX ) 25 MG tablet, TAKE 1 TABLET BY MOUTH DAILY, Disp: 60 tablet, Rfl: 5   WEGOVY  2.4 MG/0.75ML SOAJ SQ injection, Inject 2.4 mg into the skin once a week., Disp: 3 mL, Rfl: 11  Observations/Objective: Patient is well-developed, well-nourished in no acute distress.  Resting comfortably at home.  Head is normocephalic, atraumatic.  No labored breathing.  Speech is clear and coherent with logical content.  Patient is alert and oriented at baseline.    Assessment and Plan: 1. Acute viral sinusitis (Primary) - ipratropium (ATROVENT ) 0.03 % nasal spray; Place 2 sprays into both nostrils every 12 (twelve) hours.  Dispense: 30 mL; Refill: 0 - predniSONE  (DELTASONE ) 20 MG tablet; Take 1 tablet (20 mg total) by mouth daily with breakfast for 3 days.  Dispense: 3 tablet; Refill: 0  Recommend Neil sinus rinse, hot tea with honey.   Follow Up Instructions: I discussed the assessment and treatment plan with the patient. The patient was provided an opportunity to ask questions and all were answered. The patient agreed with the plan and demonstrated an understanding of the instructions.  A copy of instructions were sent to the patient via MyChart unless otherwise noted below.    The patient was advised to call back or seek an in-person evaluation if the symptoms worsen or if the condition fails to improve as anticipated.    Shenita Trego W Ruble Buttler, NP

## 2024-06-17 NOTE — Patient Instructions (Signed)
 Kathy Aguilar, thank you for joining Kathy [name], NP for today's virtual visit.  While this provider is not your primary care provider (PCP), if your PCP is located in our provider database this encounter information will be shared with them immediately following your visit.   A [facility] MyChart account gives you access to today's visit and all your visits, tests, and labs performed at [facility]  click here if you don't have a [facility] MyChart account or go to [email or web address]  Consent: (Patient) Kathy Aguilar provided verbal consent for this virtual visit at the beginning of the encounter.  Current Medications:  Current Outpatient Medications:    Cholecalciferol (D3) 25 MCG (1000 UT) capsule, Take 1,000 Units by mouth daily., Disp: , Rfl:    FLUoxetine  (PROZAC ) 10 MG capsule, Take 1 capsule (10 mg total) by mouth daily., Disp: 90 capsule, Rfl: 3   hydrochlorothiazide  (HYDRODIURIL ) 12.5 MG tablet, Take 1 tablet (12.5 mg total) by mouth daily., Disp: 90 tablet, Rfl: 3   ipratropium (ATROVENT ) 0.03 % nasal spray, Place 2 sprays into both nostrils every 12 (twelve) hours., Disp: 30 mL, Rfl: 0   predniSONE  (DELTASONE ) 20 MG tablet, Take 1 tablet (20 mg total) by mouth daily with breakfast for 3 days., Disp: 3 tablet, Rfl: 0   Rimegepant Sulfate (NURTEC) 75 MG TBDP, Take 1 tablet (75 mg total) by mouth every other day., Disp: 13 tablet, Rfl: 0   topiramate  (TOPAMAX ) 25 MG tablet, TAKE 1 TABLET BY MOUTH DAILY, Disp: 60 tablet, Rfl: 5   WEGOVY  2.4 MG/0.75ML SOAJ SQ injection, Inject 2.4 mg into the skin once a week., Disp: 3 mL, Rfl: 11   Medications ordered in this encounter:  Meds ordered this encounter  Medications   ipratropium (ATROVENT ) 0.03 % nasal spray    Sig: Place 2 sprays into both nostrils every 12 (twelve) hours.    Dispense:  30 mL    Refill:  0    Supervising Provider:   BLAISE ALEENE KIDD [[id number]]   predniSONE  (DELTASONE ) 20 MG tablet     Sig: Take 1 tablet (20 mg total) by mouth daily with breakfast for 3 days.    Dispense:  3 tablet    Refill:  0    Supervising Provider:   LAMPTEY, PHILIP O [[id number]]     *If you need refills on other medications prior to your next appointment, please contact your pharmacy*  Follow-Up: Call back or seek an in-person evaluation if the symptoms worsen or if the condition fails to improve as anticipated.  [facility] [phone]  Other Instructions INSTRUCTIONS: use a humidifier for nasal congestion Drink plenty of fluids, rest and wash hands frequently to avoid the spread of infection Alternate tylenol  and Motrin  for relief of fever    If you have been instructed to have an in-person evaluation today at a local Urgent Care facility, please use the link below. It will take you to a list of all of our available [facility], including address, phone number and hours of operation. Please do not delay care.   [facility]  If you or a family member do not have a primary care provider, use the link below to schedule a visit and establish care. When you choose a [facility] primary care physician or advanced practice provider, you gain a long-term partner in health. Find a Primary Care Provider  Learn more about [facility]'s in-office and virtual care options:  [facility] - Get Care Now

## 2024-06-19 ENCOUNTER — Other Ambulatory Visit (HOSPITAL_COMMUNITY): Payer: Self-pay

## 2024-06-25 ENCOUNTER — Other Ambulatory Visit (HOSPITAL_COMMUNITY): Payer: Self-pay

## 2024-06-28 ENCOUNTER — Encounter: Payer: Self-pay | Admitting: Family Medicine

## 2024-06-28 ENCOUNTER — Ambulatory Visit: Admitting: Family Medicine

## 2024-06-28 VITALS — BP 110/78 | HR 93 | Temp 97.9°F | Ht 66.0 in | Wt 145.8 lb

## 2024-06-28 DIAGNOSIS — E663 Overweight: Secondary | ICD-10-CM

## 2024-06-28 DIAGNOSIS — R7989 Other specified abnormal findings of blood chemistry: Secondary | ICD-10-CM

## 2024-06-28 DIAGNOSIS — E782 Mixed hyperlipidemia: Secondary | ICD-10-CM

## 2024-06-28 DIAGNOSIS — I10 Essential (primary) hypertension: Secondary | ICD-10-CM

## 2024-06-28 DIAGNOSIS — G43111 Migraine with aura, intractable, with status migrainosus: Secondary | ICD-10-CM

## 2024-06-28 MED ORDER — NURTEC 75 MG PO TBDP: 1.0000 | ORAL_TABLET | ORAL | 0 refills | Status: DC

## 2024-06-28 NOTE — Assessment & Plan Note (Signed)
 Well controlled with Nurtec. Triptan allergy.

## 2024-06-28 NOTE — Progress Notes (Signed)
 Subjective:  HPI: Kathy Aguilar is a 38 y.o. female presenting on 06/28/2024 for Follow-up (Weight f/u : needs refill of Nurtec to day /Would like flu shot today )   HPI Patient is in today for weight management follow up. She is doing well today without concerns. She has obtained a CPAP for her sleep apnea. Also did get Nurtec approved and has seen relief for her migraines. As for Kathy Aguilar blood pressure, it is well controlled and on the lower side of normal today. She has been taking hydrochlorothiazide  12.5mg  daily as prescribed. Would like to trial off in setting of normalized BMI and control of her OSA with CPAP.   WEIGHT MANAGEMENT Starting BMI: 27.87 Current BMI: 23.53 Goal weight: 140 lbs Diet: carb conscious, heart healthy, high protein Exercise: Sedentary Medication: wegovy  2.4 mg weekly Side Effects: none  HTN: on hydrochlorothiazide  12.5mg  daily, denies chest pain, palpitations, recurrent headaches, vision changes, lightheadedness, dizziness, dyspnea on exertion, or swelling of extremities.  HLD: diet controlled, recheck today  OSA: on CPAP   Review of Systems  All other systems reviewed and are negative.   Relevant past medical history reviewed and updated as indicated.   Past Medical History:  Diagnosis Date   Asthma    [place]   Dysplastic nevus 07/20/2023   Left upper back - severe- needs excision   Family history of adverse reaction to anesthesia    mother has vomiting after anesthesia   Family history of breast cancer 03/18/2017   Mom at age 41. States mom had negative genetic testing.    Fibroadenoma of breast    History of gestational hypertension    History of pre-eclampsia    Migraine without aura      Past Surgical History:  Procedure Laterality Date   BILATERAL SALPINGECTOMY Right 08/17/2016   Procedure: RIGHT SALPINGECTOMY;  Surgeon: Ronal GORMAN Pinal, MD;  Location: [facility];  Service: Gynecology;  Laterality: Right;   BREAST BIOPSY Right  03/29/2023   MM RT BREAST BX W LOC DEV 1ST LESION IMAGE BX SPEC STEREO GUIDE 03/29/2023 [facility]   BREAST BIOPSY Left 03/29/2023   US  LT BREAST BX W LOC DEV 1ST LESION IMG BX SPEC US  GUIDE 03/29/2023 [facility]   BREAST BIOPSY Left 03/29/2023   US  LT BREAST BX W LOC DEV EA ADD LESION IMG BX SPEC US  GUIDE 03/29/2023 [facility]   CESAREAN SECTION WITH BILATERAL TUBAL LIGATION Bilateral 02/19/2021   Procedure: CESAREAN SECTION WITH BILATERAL TUBAL LIGATION;  Surgeon: Izell Harari, MD;  Location: [facility];  Service: Obstetrics;  Laterality: Bilateral;   CHROMOPERTUBATION Bilateral 08/17/2016   Procedure: CHROMOPERTUBATION;  Surgeon: Ronal GORMAN Pinal, MD;  Location: [facility];  Service: Gynecology;  Laterality: Bilateral;   LAPAROSCOPIC APPENDECTOMY N/A 01/14/2016   Procedure: APPENDECTOMY LAPAROSCOPIC;  Surgeon: Charlie FORBES Fell, MD;  Location: [facility];  Service: General;  Laterality: N/A;   LAPAROSCOPIC LYSIS OF ADHESIONS  08/17/2016   Procedure: LAPAROSCOPIC LYSIS OF ADHESIONS;  Surgeon: Ronal GORMAN Pinal, MD;  Location: [facility];  Service: Gynecology;;   LAPAROSCOPY N/A 08/17/2016   Procedure: LAPAROSCOPY DIAGNOSTIC with left paratubal cystectomy;  Surgeon: Ronal GORMAN Pinal, MD;  Location: [facility];  Service: Gynecology;  Laterality: N/A;   MOLE REMOVAL Left 11/2023   SALPINGECTOMY Right    SURGICAL ENTRY ABOVE IS INCORRECT    Allergies and medications reviewed and updated.   Current Outpatient Medications:    Cholecalciferol (D3) 25 MCG (1000 UT) capsule, Take 1,000  Units by mouth daily., Disp: , Rfl:    hydrochlorothiazide  (HYDRODIURIL ) 12.5 MG tablet, Take 1 tablet (12.5 mg total) by mouth daily., Disp: 90 tablet, Rfl: 3   WEGOVY  2.4 MG/0.75ML SOAJ SQ injection, Inject 2.4 mg into the skin once a week., Disp: 3 mL, Rfl: 11   Rimegepant Sulfate (NURTEC) 75 MG TBDP, Take 1 tablet (75 mg total) by mouth every other day., Disp: 13 tablet, Rfl: 0  Allergies  Allergen Reactions    Sumatriptan  Rash    Objective:   BP 110/78   Pulse 93   Temp 97.9 F (36.6 C)   Ht 5' 6 (1.676 m)   Wt 145 lb 12.8 oz (66.1 kg)   LMP 06/07/2024 (Approximate)   SpO2 99%   BMI 23.53 kg/m      06/28/2024    9:21 AM 03/28/2024    9:14 AM 02/16/2024    9:16 AM  Vitals with BMI  Height 5' 6 5' 6 5' 6  Weight 145 lbs 13 oz 162 lbs 10 oz 168 lbs  BMI 23.54 26.26 27.13  Systolic 110 118 856  Diastolic 78 80 91  Pulse 93 105 81     Physical Exam Vitals and nursing note reviewed.  Constitutional:      Appearance: Normal appearance. She is normal weight.  HENT:     Head: Normocephalic and atraumatic.  Cardiovascular:     Rate and Rhythm: Normal rate and regular rhythm.     Pulses: Normal pulses.     Heart sounds: Normal heart sounds.  Pulmonary:     Effort: Pulmonary effort is normal.     Breath sounds: Normal breath sounds.  Skin:    General: Skin is warm and dry.  Neurological:     General: No focal deficit present.     Mental Status: She is alert and oriented to person, place, and time. Mental status is at baseline.  Psychiatric:        Mood and Affect: Mood normal.        Behavior: Behavior normal.        Thought Content: Thought content normal.        Judgment: Judgment normal.     Assessment & Plan:  Overweight (BMI 25.0-29.9) Assessment & Plan: BMI 23.53 from 27.87. Continue Wegovy  2.4mg  weekly. Continue low calories, high protein diet and moderate intensity exercise 150 minutes weekly. This is 3-5 times weekly for 30-50 minutes each session. Goal should be pace of 3 miles/hours, or walking 1.5 miles in 30 minutes.   Intractable migraine with aura with status migrainosus Assessment & Plan: Well controlled with Nurtec. Triptan allergy.  Orders: -     Nurtec; Take 1 tablet (75 mg total) by mouth every other day.  Dispense: 13 tablet; Refill: 0  Moderate mixed hyperlipidemia not requiring statin therapy Assessment & Plan: Recheck today. I recommend  consuming a heart healthy diet such as Mediterranean diet or DASH diet with whole grains, fruits, vegetable, fish, lean meats, nuts, and olive oil. Limit sweets and processed foods. I also encourage moderate intensity exercise 150 minutes weekly. This is 3-5 times weekly for 30-50 minutes each session. Goal should be pace of 3 miles/hours, or walking 1.5 miles in 30 minutes. The ASCVD Risk score (Arnett DK, et al., 2019) failed to calculate for the following reasons:   The 2019 ASCVD risk score is only valid for ages 43 to 34   Orders: -     Lipid panel  Low vitamin D   level Assessment & Plan: Check Vitamin D  today, continue supplementation OTC 800-100 international units daily  Orders: -     VITAMIN D  25 Hydroxy (Vit-D Deficiency, Fractures)  Primary hypertension Assessment & Plan: Well controlled on hydrochlorothiazide  12.5mg  daily. Has recently lost over 20 pounds and is now using CPAP. OK with trialing off. She will monitor BP at home daily for 2 weeks and resume if >140/90. Report readings to me via MyChart.  Recommend heart healthy diet such as Mediterranean diet with whole grains, fruits, vegetable, fish, lean meats, nuts, and olive oil. Limit salt. Encouraged moderate walking, 3-5 times/week for 30-50 minutes each session. Aim for at least 150 minutes.week. Goal should be pace of 3 miles/hours, or walking 1.5 miles in 30 minutes. Avoid tobacco products. Avoid excess alcohol. Take medications as prescribed and bring medications and blood pressure log with cuff to each office visit. Seek medical care for chest pain, palpitations, shortness of breath with exertion, dizziness/lightheadedness, vision changes, recurrent headaches, or swelling of extremities. Follow up in 3 months or sooner if needed      Follow up plan: Return in about 3 months (around 09/27/2024) for hypertension, weight management.  Jeoffrey GORMAN Barrio, FNP

## 2024-06-28 NOTE — Assessment & Plan Note (Signed)
 Well controlled on hydrochlorothiazide  12.5mg  daily. Has recently lost over 20 pounds and is now using CPAP. OK with trialing off. She will monitor BP at home daily for 2 weeks and resume if >140/90. Report readings to me via MyChart.  Recommend heart healthy diet such as Mediterranean diet with whole grains, fruits, vegetable, fish, lean meats, nuts, and olive oil. Limit salt. Encouraged moderate walking, 3-5 times/week for 30-50 minutes each session. Aim for at least 150 minutes.week. Goal should be pace of 3 miles/hours, or walking 1.5 miles in 30 minutes. Avoid tobacco products. Avoid excess alcohol. Take medications as prescribed and bring medications and blood pressure log with cuff to each office visit. Seek medical care for chest pain, palpitations, shortness of breath with exertion, dizziness/lightheadedness, vision changes, recurrent headaches, or swelling of extremities. Follow up in 3 months or sooner if needed

## 2024-06-28 NOTE — Assessment & Plan Note (Signed)
 Recheck today. I recommend consuming a heart healthy diet such as Mediterranean diet or DASH diet with whole grains, fruits, vegetable, fish, lean meats, nuts, and olive oil. Limit sweets and processed foods. I also encourage moderate intensity exercise 150 minutes weekly. This is 3-5 times weekly for 30-50 minutes each session. Goal should be pace of 3 miles/hours, or walking 1.5 miles in 30 minutes. The ASCVD Risk score (Arnett DK, et al., 2019) failed to calculate for the following reasons:   The 2019 ASCVD risk score is only valid for ages 12 to 81

## 2024-06-28 NOTE — Assessment & Plan Note (Signed)
 Check Vitamin D  today, continue supplementation OTC 800-100 international units daily

## 2024-06-28 NOTE — Assessment & Plan Note (Signed)
 BMI 23.53 from 27.87. Continue Wegovy  2.4mg  weekly. Continue low calories, high protein diet and moderate intensity exercise 150 minutes weekly. This is 3-5 times weekly for 30-50 minutes each session. Goal should be pace of 3 miles/hours, or walking 1.5 miles in 30 minutes.

## 2024-06-29 LAB — LIPID PANEL
Cholesterol: 139 mg/dL (ref <200)
HDL: 44 mg/dL — ABNORMAL LOW (ref >=50)
LDL Cholesterol (Calc): 78 mg/dL
Non-HDL Cholesterol (Calc): 95 mg/dL (ref <130)
Total CHOL/HDL Ratio: 3.2 (calc) (ref <5.0)
Triglycerides: 90 mg/dL (ref <150)

## 2024-06-29 LAB — VITAMIN D 25 HYDROXY (VIT D DEFICIENCY, FRACTURES): Vit D, 25-Hydroxy: 48 ng/mL (ref 30–100)

## 2024-07-02 ENCOUNTER — Ambulatory Visit: Payer: Self-pay | Admitting: Family Medicine

## 2024-07-03 ENCOUNTER — Encounter: Payer: Self-pay | Admitting: Neurology

## 2024-07-03 ENCOUNTER — Ambulatory Visit: Admitting: Neurology

## 2024-07-03 VITALS — BP 123/82 | HR 100 | Ht 66.0 in | Wt 148.0 lb

## 2024-07-03 DIAGNOSIS — G4733 Obstructive sleep apnea (adult) (pediatric): Secondary | ICD-10-CM | POA: Insufficient documentation

## 2024-07-03 NOTE — Progress Notes (Signed)
 Patient: Kathy Aguilar Date of Birth: March 14, 1986  Reason for Visit: Follow up History from: Patient Primary Neurologist: Buck  ASSESSMENT AND PLAN 38 y.o. year old female   1.  Mild OSA on CPAP  Doing well on CPAP.  Has good compliance and has noted good benefit.  She is motivated to continue nightly use minimum 4 hours.  She has noticed that with CPAP she feels more rested with more restorative sleep.  We will continue current settings.  We did discuss that she is on the mild range of OSA, may consider avoidance of supine sleep or an oral appliance.  Follow-up in 1 year virtually.  HISTORY OF PRESENT ILLNESS: Today 07/03/24 Saw Dr. Buck in May 2025 reporting snoring and daytime somnolence.  HST 03/19/2024 that showed mild OSA.  CPAP report shows 90% compliance, greater than 4 hours 80%. 5-11 cm water.  AHI 0.1, leak 2.4. Setup 05/04/24. She does feel more rested with CPAP, can tell a difference when she misses a night. Feels more rested during the day. She still wakes up often during the night, if she is tossing and turning will take it off. Started with nasal pillow mask, was tolerable, made her nose raw, got a different nasal mask that she likes better.  Motivated to continue CPAP use.  HISTORY  02/16/24 Dr. Buck: I saw your patient, Kathy Aguilar upon your kind request in my sleep clinic today for initial consultation of her sleep disorder, in particular, concern for underlying obstructive sleep apnea.  The patient is unaccompanied today.  As you know, Kathy Aguilar is a 38 year old female with an underlying medical history of hypertension, hyperlipidemia, anxiety, migraine headaches, asthma, history of preeclampsia, and overweight state, who reports snoring and excessive daytime somnolence, difficulty sleeping at night.  Her Epworth sleepiness score is 9 out of 24, fatigue severity score is 38 out of 63. I reviewed your office note from 01/18/2024.  She has no difficulty falling asleep but has  had some difficulty staying asleep.  She does not take any medication to help her sleep.  She lives with her husband and 3 children, ages 72, 16 and 35.  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  Bedtime is generally around 8:30 PM.  She does watch TV in her bedroom and typically turns it off around 9:30 PM or it goes off on a sleep timer.  Her husband works nights.  She has 1 dog in the household and the dog typically sleeps on the bed with her.  Her rise time is around 6 AM.  She drinks no daily caffeine , occasional coffee.  She drinks alcohol rarely.  She is a non-smoker.  She denies nightly nocturia but has had occasional morning headaches.  She has a history of migraine headaches as well.   REVIEW OF SYSTEMS: Out of a complete 14 system review of symptoms, the patient complains only of the following symptoms, and all other reviewed systems are negative.  See HPI  ALLERGIES: Allergies  Allergen Reactions   Sumatriptan  Rash    HOME MEDICATIONS: Outpatient Medications Prior to Visit  Medication Sig Dispense Refill   Cholecalciferol (D3) 25 MCG (1000 UT) capsule Take 1,000 Units by mouth daily.     hydrochlorothiazide  (HYDRODIURIL ) 12.5 MG tablet Take 1 tablet (12.5 mg total) by mouth daily. 90 tablet 3   Rimegepant Sulfate (NURTEC) 75 MG TBDP Take 1 tablet (75 mg total) by mouth every other day. 13 tablet 0   WEGOVY  2.4 MG/0.75ML SOAJ  SQ injection Inject 2.4 mg into the skin once a week. 3 mL 11   No facility-administered medications prior to visit.    PAST MEDICAL HISTORY: Past Medical History:  Diagnosis Date   Asthma    Juvenille   Dysplastic nevus 07/20/2023   Left upper back - severe- needs excision   Family history of adverse reaction to anesthesia    mother has vomiting after anesthesia   Family history of breast cancer 03/18/2017   Mom at age 66. States mom had negative genetic testing.    Fibroadenoma of breast    History of gestational hypertension    History of pre-eclampsia     Migraine without aura     PAST SURGICAL HISTORY: Past Surgical History:  Procedure Laterality Date   BILATERAL SALPINGECTOMY Right 08/17/2016   Procedure: RIGHT SALPINGECTOMY;  Surgeon: Ronal GORMAN Pinal, MD;  Location: [facility];  Service: Gynecology;  Laterality: Right;   BREAST BIOPSY Right 03/29/2023   MM RT BREAST BX W LOC DEV 1ST LESION IMAGE BX SPEC STEREO GUIDE 03/29/2023 [facility]   BREAST BIOPSY Left 03/29/2023   US  LT BREAST BX W LOC DEV 1ST LESION IMG BX SPEC US  GUIDE 03/29/2023 [facility]   BREAST BIOPSY Left 03/29/2023   US  LT BREAST BX W LOC DEV EA ADD LESION IMG BX SPEC US  GUIDE 03/29/2023 [facility]   CESAREAN SECTION WITH BILATERAL TUBAL LIGATION Bilateral 02/19/2021   Procedure: CESAREAN SECTION WITH BILATERAL TUBAL LIGATION;  Surgeon: Izell Harari, MD;  Location: [facility];  Service: Obstetrics;  Laterality: Bilateral;   CHROMOPERTUBATION Bilateral 08/17/2016   Procedure: CHROMOPERTUBATION;  Surgeon: Ronal GORMAN Pinal, MD;  Location: [facility];  Service: Gynecology;  Laterality: Bilateral;   LAPAROSCOPIC APPENDECTOMY N/A 01/14/2016   Procedure: APPENDECTOMY LAPAROSCOPIC;  Surgeon: Charlie FORBES Fell, MD;  Location: [facility];  Service: General;  Laterality: N/A;   LAPAROSCOPIC LYSIS OF ADHESIONS  08/17/2016   Procedure: LAPAROSCOPIC LYSIS OF ADHESIONS;  Surgeon: Ronal GORMAN Pinal, MD;  Location: [facility];  Service: Gynecology;;   LAPAROSCOPY N/A 08/17/2016   Procedure: LAPAROSCOPY DIAGNOSTIC with left paratubal cystectomy;  Surgeon: Ronal GORMAN Pinal, MD;  Location: [facility];  Service: Gynecology;  Laterality: N/A;   MOLE REMOVAL Left 11/2023   SALPINGECTOMY Right    SURGICAL ENTRY ABOVE IS INCORRECT    FAMILY HISTORY: Family History  Problem Relation Age of Onset   Breast cancer Mother 36   Osteoarthritis Maternal Grandmother    Hypertension Maternal Grandmother    Cancer Maternal Grandmother        blood cancer?   Kidney disease Maternal Grandmother     Cancer Maternal Grandfather 37       prostate cancer   Kidney disease Paternal Grandmother     SOCIAL HISTORY: Social History   Socioeconomic History   Marital status: Married    Spouse name: Not on file   Number of children: Not on file   Years of education: Not on file   Highest education level: Not on file  Occupational History   Not on file  Tobacco Use   Smoking status: Never   Smokeless tobacco: Never  Vaping Use   Vaping status: Never Used  Substance and Sexual Activity   Alcohol use: No   Drug use: No   Sexual activity: Yes    Partners: Male    Birth control/protection: None  Other Topics Concern   Not on file  Social History Narrative   Not on file  Social Drivers of Corporate investment banker Strain: Not on file  Food Insecurity: Not on file  Transportation Needs: Not on file  Physical Activity: Not on file  Stress: Not on file  Social Connections: Not on file  Intimate Partner Violence: Not on file   PHYSICAL EXAM  Vitals:   07/03/24 1235  BP: 123/82  Pulse: 100  Weight: 148 lb (67.1 kg)  Height: 5' 6 (1.676 m)   Body mass index is 23.89 kg/m.  Generalized: Well developed, in no acute distress  Neurological examination  Mentation: Alert oriented to time, place, history taking. Follows all commands speech and language fluent Cranial nerve II-XII: Pupils were equal round reactive to light.  Motor: The motor testing reveals 5 over 5 strength of all 4 extremities. Good symmetric motor tone is noted throughout.  Gait and station: Gait is normal.   DIAGNOSTIC DATA (LABS, IMAGING, TESTING) - I reviewed patient records, labs, notes, testing and imaging myself where available.  Lab Results  Component Value Date   WBC 8.5 01/12/2024   HGB 14.6 01/12/2024   HCT 43.9 01/12/2024   MCV 89.6 01/12/2024   PLT 248 01/12/2024      Component Value Date/Time   NA 138 01/12/2024 0757   NA 141 12/31/2019 0949   K 4.1 01/12/2024 0757   CL 102  01/12/2024 0757   CO2 27 01/12/2024 0757   GLUCOSE 83 01/12/2024 0757   BUN 14 01/12/2024 0757   BUN 11 12/31/2019 0949   CREATININE 0.73 01/12/2024 0757   CALCIUM 9.4 01/12/2024 0757   PROT 7.0 01/12/2024 0757   PROT 7.2 12/31/2019 0949   ALBUMIN  2.7 (L) 02/19/2021 1023   ALBUMIN  4.4 12/31/2019 0949   AST 14 01/12/2024 0757   ALT 9 01/12/2024 0757   ALKPHOS 123 02/19/2021 1023   BILITOT 0.5 01/12/2024 0757   BILITOT <0.2 12/31/2019 0949   GFRNONAA >60 02/19/2021 1023   GFRAA 109 12/31/2019 0949   Lab Results  Component Value Date   CHOL 139 06/28/2024   HDL 44 (L) 06/28/2024   LDLCALC 78 06/28/2024   LDLDIRECT 123.0 08/23/2016   TRIG 90 06/28/2024   CHOLHDL 3.2 06/28/2024   Lab Results  Component Value Date   HGBA1C 5.1 08/23/2016   Lab Results  Component Value Date   VITAMINB12 302 01/12/2024   Lab Results  Component Value Date   TSH 0.68 01/12/2024    Lauraine Born, AGNP-C, DNP 07/03/2024, 12:39 PM [facility] [address] [place], [place] [address] [phone]

## 2024-07-03 NOTE — Patient Instructions (Signed)
 Great to see you today! Continue CPAP usage minimum 4 hours nightly Continue current settings Continue to replace supplies routinely through DME Follow-up in 1 year or sooner if needed. Thanks!!

## 2024-07-19 ENCOUNTER — Ambulatory Visit: Payer: BC Managed Care – PPO | Admitting: Dermatology

## 2024-07-19 ENCOUNTER — Encounter: Payer: Self-pay | Admitting: Dermatology

## 2024-07-19 VITALS — BP 134/94 | HR 85

## 2024-07-19 DIAGNOSIS — L578 Other skin changes due to chronic exposure to nonionizing radiation: Secondary | ICD-10-CM

## 2024-07-19 DIAGNOSIS — W908XXA Exposure to other nonionizing radiation, initial encounter: Secondary | ICD-10-CM

## 2024-07-19 DIAGNOSIS — D225 Melanocytic nevi of trunk: Secondary | ICD-10-CM

## 2024-07-19 DIAGNOSIS — D489 Neoplasm of uncertain behavior, unspecified: Secondary | ICD-10-CM | POA: Diagnosis not present

## 2024-07-19 DIAGNOSIS — L821 Other seborrheic keratosis: Secondary | ICD-10-CM

## 2024-07-19 DIAGNOSIS — Z1283 Encounter for screening for malignant neoplasm of skin: Secondary | ICD-10-CM

## 2024-07-19 DIAGNOSIS — D1801 Hemangioma of skin and subcutaneous tissue: Secondary | ICD-10-CM

## 2024-07-19 DIAGNOSIS — L814 Other melanin hyperpigmentation: Secondary | ICD-10-CM

## 2024-07-19 DIAGNOSIS — D229 Melanocytic nevi, unspecified: Secondary | ICD-10-CM

## 2024-07-19 NOTE — Patient Instructions (Addendum)

## 2024-07-19 NOTE — Progress Notes (Addendum)
 Total Body Skin Exam (TBSE) Visit   Subjective  Kathy Aguilar is a 38 y.o. female who presents for the following: Skin Cancer Screening and Full Body Skin Exam  Patient presents today for follow up visit for TBSE. Patient was last evaluated on 07/20/23 and a irregular brown macule was removed from her left upper back. Patient had surgery with Dr. Paci on 11/09/23 to remove the place on her left upper back 11/24/23 and she removed a spot from her right upper back.  Patient denies medication changes. Patient reports she does not have spots, moles and lesions of concern to be evaluated. Patient reports throughout her lifetime she has had moderate sun exposure. Currently, patient reports if she has excessive sun exposure, she does apply sunscreen and/or wears protective coverings. Patient reports she has hx of bx. Patient denies  family history of skin cancers. The patient has spots, moles and lesions to be evaluated, some may be new or changing and the patient has concerns that these could be cancer.  The following portions of the chart were reviewed this encounter and updated as appropriate: medications, allergies, medical history  Review of Systems:  No other skin or systemic complaints except as noted in HPI or Assessment and Plan.  Objective  Well appearing patient in no apparent distress; mood and affect are within normal limits.  A full examination was performed including scalp, head, eyes, ears, nose, lips, neck, chest, axillae, abdomen, back, buttocks, bilateral upper extremities, bilateral lower extremities, hands, feet, fingers, toes, fingernails, and toenails. All findings within normal limits unless otherwise noted below.   Relevant physical exam findings are noted in the Assessment and Plan.  Left Upper Back 0.6 irregular brown papule    Assessment & Plan   LENTIGINES, SEBORRHEIC KERATOSES, HEMANGIOMAS - Benign normal skin lesions - Benign-appearing - Call for any  changes  MELANOCYTIC NEVI - Tan-brown and/or pink-flesh-colored symmetric macules and papules - Benign appearing on exam today - Observation - Call clinic for new or changing moles - Recommend daily use of broad spectrum spf 30+ sunscreen to sun-exposed areas.   ACTINIC DAMAGE - Chronic condition, secondary to cumulative UV/sun exposure - diffuse scaly erythematous macules with underlying dyspigmentation - Recommend daily broad spectrum sunscreen SPF 30+ to sun-exposed areas, reapply every 2 hours as needed.  - Staying in the shade or wearing long sleeves, sun glasses (UVA+UVB protection) and wide brim hats (4-inch brim around the entire circumference of the hat) are also recommended for sun protection.  - Call for new or changing lesions.  SKIN CANCER SCREENING PERFORMED TODAY.   0.6 irrgelular brown papule  left post shoulder R/O DN  NEOPLASM OF UNCERTAIN BEHAVIOR Left Upper Back Epidermal / dermal shaving  Lesion diameter (cm):  0.6 Informed consent: discussed and consent obtained   Timeout: patient name, date of birth, surgical site, and procedure verified   Procedure prep:  Patient was prepped and draped in usual sterile fashion Prep type:  Isopropyl alcohol Anesthesia: the lesion was anesthetized in a standard fashion   Anesthetic:  1% lidocaine  w/ epinephrine  1-100,000 buffered w/ 8.4% NaHCO3 Instrument used: flexible razor blade   Hemostasis achieved with: pressure, aluminum chloride and electrodesiccation   Outcome: patient tolerated procedure well   Post-procedure details: sterile dressing applied and wound care instructions given   Dressing type: bandage and petrolatum    Specimen 1 - Surgical pathology Differential Diagnosis: R/O DN  Check Margins: No SKIN EXAM FOR MALIGNANT NEOPLASM   MULTIPLE BENIGN  MELANOCYTIC NEVI   CHERRY ANGIOMA   LENTIGINES   SEBORRHEIC KERATOSIS   ACTINIC SKIN DAMAGE   Return in about 1 year (around 07/19/2025) for TBSE  follow up.  I, Doyce Pan, CMA, am acting as scribe for [employer], DO.   Documentation: I have reviewed the above documentation for accuracy and completeness, and I agree with the above.  Delon Lenis, DO

## 2024-07-20 LAB — SURGICAL PATHOLOGY

## 2024-07-26 ENCOUNTER — Ambulatory Visit: Payer: Self-pay | Admitting: Dermatology

## 2024-09-13 ENCOUNTER — Other Ambulatory Visit: Payer: Self-pay

## 2024-09-13 ENCOUNTER — Encounter: Payer: Self-pay | Admitting: Family Medicine

## 2024-09-13 DIAGNOSIS — G43111 Migraine with aura, intractable, with status migrainosus: Secondary | ICD-10-CM

## 2024-09-13 MED ORDER — NURTEC 75 MG PO TBDP: 1.0000 | ORAL_TABLET | ORAL | 2 refills | Status: AC

## 2024-09-27 ENCOUNTER — Encounter: Payer: Self-pay | Admitting: Family Medicine

## 2024-09-27 ENCOUNTER — Ambulatory Visit: Admitting: Family Medicine

## 2024-09-27 VITALS — BP 120/80 | HR 96 | Temp 98.2°F | Ht 66.0 in | Wt 136.4 lb

## 2024-09-27 DIAGNOSIS — G4733 Obstructive sleep apnea (adult) (pediatric): Secondary | ICD-10-CM | POA: Diagnosis not present

## 2024-09-27 DIAGNOSIS — G43111 Migraine with aura, intractable, with status migrainosus: Secondary | ICD-10-CM | POA: Diagnosis not present

## 2024-09-27 DIAGNOSIS — E782 Mixed hyperlipidemia: Secondary | ICD-10-CM

## 2024-09-27 DIAGNOSIS — F411 Generalized anxiety disorder: Secondary | ICD-10-CM

## 2024-09-27 DIAGNOSIS — I10 Essential (primary) hypertension: Secondary | ICD-10-CM | POA: Diagnosis not present

## 2024-09-27 DIAGNOSIS — R7989 Other specified abnormal findings of blood chemistry: Secondary | ICD-10-CM | POA: Diagnosis not present

## 2024-09-27 DIAGNOSIS — E663 Overweight: Secondary | ICD-10-CM

## 2024-09-27 MED ORDER — FLUOXETINE HCL 20 MG PO CAPS: 20.0000 mg | ORAL_CAPSULE | Freq: Every day | ORAL | 1 refills | Status: AC

## 2024-09-27 NOTE — Progress Notes (Signed)
 Acute Office Visit  Patient ID: Kathy Aguilar, female    DOB: August 22, 1986, 38 y.o.   MRN: [id number]  PCP: Kayla Jeoffrey RAMAN, FNP  Chief Complaint  Patient presents with   Medical Management of Chronic Issues    Wt loss      Subjective:     HPI  Discussed the use of AI scribe software for clinical note transcription with the patient, who gave verbal consent to proceed.  History of Present Illness Kathy Aguilar is a 38 year old female who presents for follow-up of chronic conditions.  She is currently taking Wegovy  at a dose of 2.4 mg. Significant weight loss has not been achieved per patient report. Her diet is rich in protein, primarily consisting of chicken and steak, and she has recently started taekwondo as a form of exercise. Hydrochlorothiazide  was discontinued in September, but she continues to take vitamin D  supplements.  She experiences increased anxiety and symptoms of depression, including excessive worry, irritability, and difficulty controlling her temper, particularly in the mornings and during interactions with her seven-year-old child who has ADHD. She feels chest tightness when anxious but has no left-sided chest pain, shortness of breath, or dizziness. She is currently taking Prozac . Denies SI/HI  She reports a significant reduction in headache frequency since losing weight, now experiencing approximately two headaches per month. She uses Nurtec for headache management.  She received the flu vaccine during her last visit and is considering the hepatitis B vaccine. She believes she has previously received the Gardasil vaccine.  No new symptoms, including vision changes, upset stomach, constipation, diarrhea, nausea, vomiting, or acid reflux.     09/27/2024   10:42 AM 06/28/2024    9:30 AM 03/28/2024    9:20 AM 02/15/2024   12:17 PM  GAD 7 : Generalized Anxiety Score  Nervous, Anxious, on Edge 2 1 1 1   Control/stop worrying 2 1 1 1   Worry too much - different  things 2 1 1  0  Trouble relaxing 1 1 1 1   Restless 1 1 1 1   Easily annoyed or irritable 2 2 1 1   Afraid - awful might happen 1 0 1 0  Total GAD 7 Score 11 7 7 5   Anxiety Difficulty Somewhat difficult Somewhat difficult Somewhat difficult Somewhat difficult       09/27/2024   10:42 AM 06/28/2024    9:30 AM 03/28/2024    9:19 AM 02/15/2024   12:17 PM 02/01/2024   11:55 AM  Depression screen PHQ 2/9  Decreased Interest 2 1 1 1 1   Down, Depressed, Hopeless 2 1 1 1 1   PHQ - 2 Score 4 2 2 2 2   Altered sleeping 1 1 3 3 3   Tired, decreased energy 1 1 2 2 2   Change in appetite 0 0 0 0 1  Feeling bad or failure about yourself  2 1 0 1 1  Trouble concentrating 1 0 1 0 1  Moving slowly or fidgety/restless 0 0 0 0 0  Suicidal thoughts 0 0 0 0 0  PHQ-9 Score 9 5  8  8  10    Difficult doing work/chores Somewhat difficult Somewhat difficult Somewhat difficult Somewhat difficult Somewhat difficult     Data saved with a previous flowsheet row definition      Review of Systems  All other systems reviewed and are negative.   Past Medical History:  Diagnosis Date   Asthma    Juvenille   Dysplastic nevus 07/20/2023  Left upper back - severe- needs excision   Family history of adverse reaction to anesthesia    mother has vomiting after anesthesia   Family history of breast cancer 03/18/2017   Mom at age 39. States mom had negative genetic testing.    Fibroadenoma of breast    History of gestational hypertension    History of pre-eclampsia    Migraine without aura     Past Surgical History:  Procedure Laterality Date   BILATERAL SALPINGECTOMY Right 08/17/2016   Procedure: RIGHT SALPINGECTOMY;  Surgeon: Ronal GORMAN Pinal, MD;  Location: [facility];  Service: Gynecology;  Laterality: Right;   BREAST BIOPSY Right 03/29/2023   MM RT BREAST BX W LOC DEV 1ST LESION IMAGE BX SPEC STEREO GUIDE 03/29/2023 [facility]   BREAST BIOPSY Left 03/29/2023   US  LT BREAST BX W LOC DEV 1ST LESION IMG BX  SPEC US  GUIDE 03/29/2023 [facility]   BREAST BIOPSY Left 03/29/2023   US  LT BREAST BX W LOC DEV EA ADD LESION IMG BX SPEC US  GUIDE 03/29/2023 [facility]   CESAREAN SECTION WITH BILATERAL TUBAL LIGATION Bilateral 02/19/2021   Procedure: CESAREAN SECTION WITH BILATERAL TUBAL LIGATION;  Surgeon: Izell Harari, MD;  Location: [facility];  Service: Obstetrics;  Laterality: Bilateral;   CHROMOPERTUBATION Bilateral 08/17/2016   Procedure: CHROMOPERTUBATION;  Surgeon: Ronal GORMAN Pinal, MD;  Location: [facility];  Service: Gynecology;  Laterality: Bilateral;   LAPAROSCOPIC APPENDECTOMY N/A 01/14/2016   Procedure: APPENDECTOMY LAPAROSCOPIC;  Surgeon: Charlie FORBES Fell, MD;  Location: [facility];  Service: General;  Laterality: N/A;   LAPAROSCOPIC LYSIS OF ADHESIONS  08/17/2016   Procedure: LAPAROSCOPIC LYSIS OF ADHESIONS;  Surgeon: Ronal GORMAN Pinal, MD;  Location: [facility];  Service: Gynecology;;   LAPAROSCOPY N/A 08/17/2016   Procedure: LAPAROSCOPY DIAGNOSTIC with left paratubal cystectomy;  Surgeon: Ronal GORMAN Pinal, MD;  Location: [facility];  Service: Gynecology;  Laterality: N/A;   MOLE REMOVAL Left 11/2023   SALPINGECTOMY Right    SURGICAL ENTRY ABOVE IS INCORRECT    Outpatient Medications Prior to Visit  Medication Sig Dispense Refill   Cholecalciferol (D3) 25 MCG (1000 UT) capsule Take 1,000 Units by mouth daily.     Rimegepant Sulfate (NURTEC) 75 MG TBDP Take 1 tablet (75 mg total) by mouth every other day. 13 tablet 2   WEGOVY  2.4 MG/0.75ML SOAJ SQ injection Inject 2.4 mg into the skin once a week. 3 mL 11   FLUoxetine  (PROZAC ) 10 MG capsule Take 10 mg by mouth daily.     hydrochlorothiazide  (HYDRODIURIL ) 12.5 MG tablet Take 1 tablet (12.5 mg total) by mouth daily. 90 tablet 3   No facility-administered medications prior to visit.    Allergies[1]     Objective:    BP 120/80   Pulse 96   Temp 98.2 F (36.8 C)   Ht 5' 6 (1.676 m)   Wt 136 lb 6.4 oz (61.9 kg)   LMP 09/05/2024 (Exact  Date)   SpO2 99%   BMI 22.02 kg/m  BP Readings from Last 3 Encounters:  09/27/24 120/80  07/19/24 (!) 134/94  07/03/24 123/82   Wt Readings from Last 3 Encounters:  09/27/24 136 lb 6.4 oz (61.9 kg)  07/03/24 148 lb (67.1 kg)  06/28/24 145 lb 12.8 oz (66.1 kg)      Physical Exam Vitals and nursing note reviewed.  Constitutional:      Appearance: Normal appearance. She is normal weight.  HENT:     Head: Normocephalic and  atraumatic.  Cardiovascular:     Rate and Rhythm: Normal rate and regular rhythm.     Pulses: Normal pulses.     Heart sounds: Normal heart sounds.  Pulmonary:     Effort: Pulmonary effort is normal.     Breath sounds: Normal breath sounds.  Skin:    General: Skin is warm and dry.  Neurological:     General: No focal deficit present.     Mental Status: She is alert and oriented to person, place, and time. Mental status is at baseline.  Psychiatric:        Mood and Affect: Mood normal.        Behavior: Behavior normal.        Thought Content: Thought content normal.        Judgment: Judgment normal.       No results found for any visits on 09/27/24.     Assessment & Plan:   Problem List Items Addressed This Visit       Cardiovascular and Mediastinum   Intractable migraine with aura with status migrainosus   Relevant Medications   FLUoxetine  (PROZAC ) 20 MG capsule   Hypertension - Primary     Respiratory   OSA on CPAP     Other   Overweight (BMI 25.0-29.9)   Moderate mixed hyperlipidemia not requiring statin therapy   Generalized anxiety disorder   Relevant Medications   FLUoxetine  (PROZAC ) 20 MG capsule   Low vitamin D  level    Assessment and Plan Assessment & Plan Generalized anxiety disorder Increased anxiety symptoms, current Prozac  dose insufficient. - Increased Prozac  to 20 mg daily. - Advised to take two 10 mg tablets until new prescription is filled. - Instructed to report if symptoms do not improve or  worsen.  Intractable migraine with aura with status migrainosus Significant reduction in migraine frequency, no new symptoms. - Continue current management with Nurtec.  Obstructive sleep apnea Continues CPAP therapy with no new symptoms. - Continue CPAP therapy.  Overweight Weight loss of 10 pounds, stable on Wegovy , regular exercise, high-protein diet. - Continue Wegovy  2.4 mg weekly. - Encouraged continuation of current lifestyle modifications, including exercise and diet.  Essential (primary) hypertension Blood pressure well-controlled without hydrochlorothiazide . - Continue monitoring blood pressure.  Mixed hyperlipidemia Cholesterol levels within normal limits in September.  Low vitamin D  level Continues vitamin D  supplementation. - Continue vitamin D  supplementation. - Will recheck vitamin D  levels in April.  General Health Maintenance Received flu vaccine, hepatitis B vaccine recommended, confirmed previous HPV vaccination. - Administered hepatitis B vaccine.    Meds ordered this encounter  Medications   FLUoxetine  (PROZAC ) 20 MG capsule    Sig: Take 1 capsule (20 mg total) by mouth daily.    Dispense:  90 capsule    Refill:  1    Supervising Provider:   DUANNE LOWERS T [[id number]]    Return in about 4 months (around 01/26/2025) for annual physical with labs 1 week prior.  Jeoffrey GORMAN Barrio, FNP [facility]      [1]  Allergies Allergen Reactions   Sumatriptan  Rash

## 2025-01-25 ENCOUNTER — Other Ambulatory Visit

## 2025-01-28 ENCOUNTER — Encounter: Admitting: Family Medicine

## 2025-07-04 ENCOUNTER — Ambulatory Visit: Admitting: Neurology

## 2025-07-22 ENCOUNTER — Ambulatory Visit: Admitting: Dermatology
